# Patient Record
Sex: Male | Born: 1975
Health system: Southern US, Community
[De-identification: ages and names within clinical notes are randomized; demographics above are authoritative.]

## PROBLEM LIST (undated history)

## (undated) DIAGNOSIS — G4730 Sleep apnea, unspecified: Secondary | ICD-10-CM

## (undated) DIAGNOSIS — F419 Anxiety disorder, unspecified: Secondary | ICD-10-CM

## (undated) DIAGNOSIS — L409 Psoriasis, unspecified: Secondary | ICD-10-CM

## (undated) DIAGNOSIS — M549 Dorsalgia, unspecified: Secondary | ICD-10-CM

## (undated) DIAGNOSIS — G4733 Obstructive sleep apnea (adult) (pediatric): Secondary | ICD-10-CM

## (undated) DIAGNOSIS — I10 Essential (primary) hypertension: Secondary | ICD-10-CM

## (undated) DIAGNOSIS — E785 Hyperlipidemia, unspecified: Secondary | ICD-10-CM

## (undated) HISTORY — DX: Obstructive sleep apnea (adult) (pediatric): G47.33

## (undated) HISTORY — DX: Essential (primary) hypertension: I10

## (undated) HISTORY — DX: Hyperlipidemia, unspecified: E78.5

## (undated) HISTORY — DX: Dorsalgia, unspecified: M54.9

## (undated) HISTORY — DX: Morbid (severe) obesity due to excess calories: E66.01

## (undated) HISTORY — PX: KNEE ARTHROSCOPY: SUR90

## (undated) HISTORY — DX: Sleep apnea, unspecified: G47.30

## (undated) HISTORY — DX: Psoriasis, unspecified: L40.9

## (undated) HISTORY — DX: Anxiety disorder, unspecified: F41.9

---

## 2011-08-21 ENCOUNTER — Encounter: Payer: Self-pay | Admitting: Internal Medicine

## 2011-08-21 DIAGNOSIS — Z0000 Encounter for general adult medical examination without abnormal findings: Secondary | ICD-10-CM | POA: Insufficient documentation

## 2011-08-27 ENCOUNTER — Other Ambulatory Visit (INDEPENDENT_AMBULATORY_CARE_PROVIDER_SITE_OTHER): Payer: BC Managed Care – PPO

## 2011-08-27 ENCOUNTER — Other Ambulatory Visit: Payer: Self-pay | Admitting: Internal Medicine

## 2011-08-27 ENCOUNTER — Ambulatory Visit (INDEPENDENT_AMBULATORY_CARE_PROVIDER_SITE_OTHER): Payer: BC Managed Care – PPO | Admitting: Internal Medicine

## 2011-08-27 ENCOUNTER — Encounter: Payer: Self-pay | Admitting: Internal Medicine

## 2011-08-27 VITALS — BP 142/88 | HR 71 | Temp 98.6°F | Ht 71.0 in | Wt 332.0 lb

## 2011-08-27 DIAGNOSIS — Z0000 Encounter for general adult medical examination without abnormal findings: Secondary | ICD-10-CM

## 2011-08-27 DIAGNOSIS — E782 Mixed hyperlipidemia: Secondary | ICD-10-CM | POA: Insufficient documentation

## 2011-08-27 DIAGNOSIS — F419 Anxiety disorder, unspecified: Secondary | ICD-10-CM

## 2011-08-27 DIAGNOSIS — E785 Hyperlipidemia, unspecified: Secondary | ICD-10-CM

## 2011-08-27 DIAGNOSIS — F411 Generalized anxiety disorder: Secondary | ICD-10-CM

## 2011-08-27 DIAGNOSIS — I10 Essential (primary) hypertension: Secondary | ICD-10-CM

## 2011-08-27 HISTORY — DX: Hyperlipidemia, unspecified: E78.5

## 2011-08-27 HISTORY — DX: Essential (primary) hypertension: I10

## 2011-08-27 HISTORY — DX: Anxiety disorder, unspecified: F41.9

## 2011-08-27 LAB — CBC WITH DIFFERENTIAL/PLATELET
Basophils Relative: 0.2 % (ref 0.0–3.0)
Eosinophils Absolute: 0 10*3/uL (ref 0.0–0.7)
Eosinophils Relative: 0.5 % (ref 0.0–5.0)
Hemoglobin: 16 g/dL (ref 13.0–17.0)
MCHC: 34.8 g/dL (ref 30.0–36.0)
MCV: 85.7 fl (ref 78.0–100.0)
Monocytes Absolute: 0.4 10*3/uL (ref 0.1–1.0)
Neutro Abs: 6.5 10*3/uL (ref 1.4–7.7)
Neutrophils Relative %: 74.4 % (ref 43.0–77.0)
RBC: 5.37 Mil/uL (ref 4.22–5.81)
WBC: 8.7 10*3/uL (ref 4.5–10.5)

## 2011-08-27 LAB — LIPID PANEL
Cholesterol: 223 mg/dL — ABNORMAL HIGH (ref 0–200)
HDL: 31.5 mg/dL — ABNORMAL LOW (ref >39.00)
Total CHOL/HDL Ratio: 7
Triglycerides: 325 mg/dL — ABNORMAL HIGH (ref 0.0–149.0)
VLDL: 65 mg/dL — ABNORMAL HIGH (ref 0.0–40.0)

## 2011-08-27 LAB — URINALYSIS, ROUTINE W REFLEX MICROSCOPIC
Bilirubin Urine: NEGATIVE
Nitrite: NEGATIVE
Total Protein, Urine: NEGATIVE
Urine Glucose: NEGATIVE
WBC, UA: NONE SEEN (ref 0–?)
pH: 6 (ref 5.0–8.0)

## 2011-08-27 LAB — HEPATIC FUNCTION PANEL
ALT: 29 U/L (ref 0–53)
Bilirubin, Direct: 0.1 mg/dL (ref 0.0–0.3)
Total Bilirubin: 1 mg/dL (ref 0.3–1.2)

## 2011-08-27 LAB — BASIC METABOLIC PANEL
BUN: 15 mg/dL (ref 6–23)
Calcium: 9.7 mg/dL (ref 8.4–10.5)
Creatinine, Ser: 1 mg/dL (ref 0.4–1.5)
GFR: 86.78 mL/min (ref >60.00)

## 2011-08-27 MED ORDER — ESCITALOPRAM OXALATE 10 MG PO TABS: 10.0000 mg | ORAL_TABLET | Freq: Every day | ORAL | Status: DC

## 2011-08-27 MED ORDER — LOSARTAN POTASSIUM 50 MG PO TABS: 50.0000 mg | ORAL_TABLET | Freq: Every day | ORAL | Status: DC

## 2011-08-27 NOTE — Patient Instructions (Addendum)
Take all new medications as prescribed - the blood pressure medication, and lexapro Please check your blood pressure on a regular basis;  Your goal is to be less than 140/90 Continue all other medications as before, such as tylenol or other OTC medication if needed Please go to LAB in the Basement for the blood and/or urine tests to be done today Please call the phone number [phone] (the [employer] System) for results of testing in 2-3 days;  When calling, simply dial the number, and when prompted enter the MRN number above (the Medical Record Number) and the # key, then the message should start. Please return in 6 months, or sooner if needed

## 2011-08-27 NOTE — Assessment & Plan Note (Signed)

## 2011-08-27 NOTE — Assessment & Plan Note (Addendum)
Mild to mod, for lexapro asd,  to f/u any worsening symptoms or concerns, declines counseling at this time

## 2011-08-27 NOTE — Progress Notes (Signed)
Subjective:    Patient ID: Kyle Levy, male    DOB: 1975/11/12, 35 y.o.   MRN: [id number]  HPI  Here for wellness and f/u;  Overall doing ok;  Pt denies CP, worsening SOB, DOE, wheezing, orthopnea, PND, worsening LE edema, palpitations, dizziness or syncope.  Pt denies neurological change such as new Headache, facial or extremity weakness.  Pt denies polydipsia, polyuria, or low sugar symptoms. Pt states overall good compliance with treatment and medications, good tolerability, and trying to follow lower cholesterol diet.  Pt denies worsening depressive symptoms, suicidal ideation or panic. No fever, wt loss, night sweats, loss of appetite, or other constitutional symptoms.  Pt states good ability with ADL's, low fall risk, home safety reviewed and adequate, no significant changes in hearing or vision, and occasionally active with exercise.  Has significant situational anxiety with getting through airports, and other situations with frustration, irritability.  Denies worsening depressive symptoms, suicidal ideation, or panic, though has ongoing anxiety, not increased except for the above.  Has ongoing financial stressors, married, one child.  No signficant OSA symptoms Past Medical History  Diagnosis Date  . Hypertension   . HTN (hypertension) 08/27/2011   Past Surgical History  Procedure Date  . Knee arthroscopy     bilateral knee    reports that he has never smoked. He does not have any smokeless tobacco history on file. He reports that he drinks alcohol. He reports that he does not use illicit drugs. family history includes Heart disease in his father; Hypertension in his father; and Stroke in his father. No Known Allergies No current outpatient prescriptions on file prior to visit.    Review of Systems Review of Systems  Constitutional: Negative for diaphoresis, activity change, appetite change and unexpected weight change.  HENT: Negative for hearing loss, ear pain, facial swelling,  mouth sores and neck stiffness.   Eyes: Negative for pain, redness and visual disturbance.  Respiratory: Negative for shortness of breath and wheezing.   Cardiovascular: Negative for chest pain and palpitations.  Gastrointestinal: Negative for diarrhea, blood in stool, abdominal distention and rectal pain.  Genitourinary: Negative for hematuria, flank pain and decreased urine volume.  Musculoskeletal: Negative for myalgias and joint swelling.  Skin: Negative for color change and wound.  Neurological: Negative for syncope and numbness.  Hematological: Negative for adenopathy.  Psychiatric/Behavioral: Negative for hallucinations, self-injury, decreased concentration and agitation.      Objective:   Physical Exam BP 142/88  Pulse 71  Temp(Src) 98.6 F (37 C) (Oral)  Ht 5\' 11"  (1.803 m)  Wt 332 lb (150.594 kg)  BMI 46.30 kg/m2  SpO2 97% Physical Exam  VS noted Constitutional: Pt is oriented to person, place, and time. Appears well-developed and well-nourished.  HENT:  Head: Normocephalic and atraumatic.  Right Ear: External ear normal.  Left Ear: External ear normal.  Nose: Nose normal.  Mouth/Throat: Oropharynx is clear and moist.  Eyes: Conjunctivae and EOM are normal. Pupils are equal, round, and reactive to light.  Neck: Normal range of motion. Neck supple. No JVD present. No tracheal deviation present.  Cardiovascular: Normal rate, regular rhythm, normal heart sounds and intact distal pulses.   Pulmonary/Chest: Effort normal and breath sounds normal.  Abdominal: Soft. Bowel sounds are normal. There is no tenderness.  Musculoskeletal: Normal range of motion. Exhibits no edema.  Lymphadenopathy:  Has no cervical adenopathy.  Neurological: Pt is alert and oriented to person, place, and time. Pt has normal reflexes. No cranial nerve deficit.  Skin:  Skin is warm and dry. No rash noted.  Psychiatric:  Behavior is normal. Except for 1+ irritable, not depressed affect   Assessment  & Plan:

## 2011-08-27 NOTE — Assessment & Plan Note (Signed)
Mild, to start losartan 50 qd, f/u next visit BP Readings from Last 3 Encounters:  08/27/11 142/88

## 2011-08-27 NOTE — Assessment & Plan Note (Signed)
Has been on prior statin but did not want to take long term, for lower chol diet, check lipids

## 2011-10-19 ENCOUNTER — Telehealth: Payer: Self-pay | Admitting: Internal Medicine

## 2011-10-19 NOTE — Telephone Encounter (Signed)
Requested Lexapro samples, if available.   [phone]  Thanks!

## 2011-10-19 NOTE — Telephone Encounter (Signed)
Pt's spouse advised 

## 2011-11-08 ENCOUNTER — Other Ambulatory Visit: Payer: Self-pay

## 2011-11-08 DIAGNOSIS — F419 Anxiety disorder, unspecified: Secondary | ICD-10-CM

## 2011-11-08 MED ORDER — LOSARTAN POTASSIUM 50 MG PO TABS: 50.0000 mg | ORAL_TABLET | Freq: Every day | ORAL | Status: DC

## 2011-11-16 ENCOUNTER — Other Ambulatory Visit: Payer: Self-pay

## 2011-11-16 MED ORDER — ESCITALOPRAM OXALATE 10 MG PO TABS: 10.0000 mg | ORAL_TABLET | Freq: Every day | ORAL | Status: DC

## 2012-02-29 ENCOUNTER — Ambulatory Visit (INDEPENDENT_AMBULATORY_CARE_PROVIDER_SITE_OTHER): Payer: BC Managed Care – PPO | Admitting: Internal Medicine

## 2012-02-29 ENCOUNTER — Encounter: Payer: Self-pay | Admitting: Internal Medicine

## 2012-02-29 VITALS — BP 122/84 | HR 69 | Temp 97.5°F | Ht 71.0 in | Wt 345.2 lb

## 2012-02-29 DIAGNOSIS — F419 Anxiety disorder, unspecified: Secondary | ICD-10-CM

## 2012-02-29 DIAGNOSIS — E785 Hyperlipidemia, unspecified: Secondary | ICD-10-CM

## 2012-02-29 DIAGNOSIS — I10 Essential (primary) hypertension: Secondary | ICD-10-CM

## 2012-02-29 DIAGNOSIS — Z0000 Encounter for general adult medical examination without abnormal findings: Secondary | ICD-10-CM

## 2012-02-29 DIAGNOSIS — F411 Generalized anxiety disorder: Secondary | ICD-10-CM

## 2012-02-29 NOTE — Patient Instructions (Addendum)
Continue all other medications as before Please continue your efforts at being more active, low cholesterol diet, and weight control. Please return in 6 months for your yearly visit, or sooner if needed, with Lab testing done 3-5 days before

## 2012-03-01 ENCOUNTER — Encounter: Payer: Self-pay | Admitting: Internal Medicine

## 2012-03-01 NOTE — Assessment & Plan Note (Signed)
stable overall by hx and exam, most recent data reviewed with pt, and pt to continue medical treatment as before Lab Results  Component Value Date   WBC 8.7 08/27/2011   HGB 16.0 08/27/2011   HCT 46.0 08/27/2011   PLT 293.0 08/27/2011   GLUCOSE 93 08/27/2011   CHOL 223* 08/27/2011   TRIG 325.0* 08/27/2011   HDL 31.50* 08/27/2011   LDLDIRECT 141.0 08/27/2011   ALT 29 08/27/2011   AST 31 08/27/2011   NA 137 08/27/2011   K 4.4 08/27/2011   CL 101 08/27/2011   CREATININE 1.0 08/27/2011   BUN 15 08/27/2011   CO2 27 08/27/2011   TSH 1.28 08/27/2011    

## 2012-03-01 NOTE — Assessment & Plan Note (Signed)
stable overall by hx and exam, most recent data reviewed with pt, and pt to continue medical treatment as before BP Readings from Last 3 Encounters:  02/29/12 122/84  08/27/11 142/88

## 2012-03-01 NOTE — Assessment & Plan Note (Signed)
stable overall by hx and exam, most recent data reviewed with pt - DLDL 141 last visit, and pt to continue medical treatment as before but to follow lower chol diet

## 2012-03-01 NOTE — Progress Notes (Signed)
  Subjective:    Patient ID: Kyle Levy, male    DOB: 1976/05/14, 36 y.o.   MRN: [id number]  HPI  Here to f/u; overall doing ok,  Pt denies chest pain, increased sob or doe, wheezing, orthopnea, PND, increased LE swelling, palpitations, dizziness or syncope.  Pt denies new neurological symptoms such as new headache, or facial or extremity weakness or numbness   Pt denies polydipsia, polyuria.  Pt states overall good compliance with meds, trying to follow lower cholesterol diet, wt overall stable but little exercise however.  Wt increased from 332 to 345, despite going to gym to do 30 min on treadmill 3 times per wk.  Denies worsening depressive symptoms, suicidal ideation, or panic, though has ongoing anxiety, not increased recently.  Pt denies fever, wt loss, night sweats, loss of appetite, or other constitutional symptoms  Past Medical History  Diagnosis Date  . Hypertension   . HTN (hypertension) 08/27/2011  . Hyperlipidemia 08/27/2011  . Anxiety 08/27/2011   Past Surgical History  Procedure Date  . Knee arthroscopy     bilateral knee    reports that he has never smoked. He does not have any smokeless tobacco history on file. He reports that he drinks alcohol. He reports that he does not use illicit drugs. family history includes Heart disease in his father; Hypertension in his father; and Stroke in his father. No Known Allergies Current Outpatient Prescriptions on File Prior to Visit  Medication Sig Dispense Refill  . Multiple Vitamin (MULTIVITAMIN) tablet Take 1 tablet by mouth daily.        . B Complex-C (SUPER B COMPLEX PO) Take by mouth daily.        Marland Kitchen escitalopram (LEXAPRO) 10 MG tablet Take 1 tablet (10 mg total) by mouth daily.  90 tablet  3  . losartan (COZAAR) 50 MG tablet Take 1 tablet (50 mg total) by mouth daily.  90 tablet  3   Review of Systems Review of Systems  Constitutional: Negative for diaphoresis and unexpected weight change.  Eyes: Negative for photophobia and  visual disturbance.  Respiratory: Negative for choking and stridor.   Gastrointestinal: Negative for vomiting and blood in stool.  Genitourinary: Negative for hematuria and decreased urine volume.  Musculoskeletal: Negative for gait problem.  Skin: Negative for color change and wound.  Neurological: Negative for tremors and numbness.  Psychiatric/Behavioral: Negative for decreased concentration. The patient is not hyperactive.      Objective:   Physical Exam BP 122/84  Pulse 69  Temp(Src) 97.5 F (36.4 C) (Oral)  Ht 5\' 11"  (1.803 m)  Wt 345 lb 4 oz (156.604 kg)  BMI 48.15 kg/m2  SpO2 95% Physical Exam  VS noted Constitutional: Pt appears well-developed and well-nourished.  HENT: Head: Normocephalic.  Right Ear: External ear normal.  Left Ear: External ear normal.  Eyes: Conjunctivae and EOM are normal. Pupils are equal, round, and reactive to light.  Neck: Normal range of motion. Neck supple.  Cardiovascular: Normal rate and regular rhythm.   Pulmonary/Chest: Effort normal and breath sounds normal.  Abd:  Soft, NT, non-distended, + BS Neurological: Pt is alert. No cranial nerve deficit.  Skin: Skin is warm. No erythema.  Psychiatric: Pt behavior is normal. Thought content normal.     Assessment & Plan:

## 2012-04-29 ENCOUNTER — Telehealth: Payer: Self-pay | Admitting: Internal Medicine

## 2012-04-29 NOTE — Telephone Encounter (Signed)
Caller: Rick/Patient; PCP: Oliver Barre; CB#: [phone];  Call regarding Blood On Toilet Tissue After Stooling; Reports one episode approx July 4 and another on 04/29/12. Emergent symptoms ruled out.  See within 2 weeks per GI Bleeding protocol.  Appointment with Dr. Jonny Ruiz on 8/8/ @ 11:00.  Home care for the interim and parameters for callback given.

## 2012-05-01 ENCOUNTER — Encounter: Payer: Self-pay | Admitting: Internal Medicine

## 2012-05-01 ENCOUNTER — Ambulatory Visit (INDEPENDENT_AMBULATORY_CARE_PROVIDER_SITE_OTHER): Payer: BC Managed Care – PPO | Admitting: Internal Medicine

## 2012-05-01 VITALS — BP 140/90 | HR 70 | Temp 97.3°F | Ht 71.0 in | Wt 344.2 lb

## 2012-05-01 DIAGNOSIS — F411 Generalized anxiety disorder: Secondary | ICD-10-CM

## 2012-05-01 DIAGNOSIS — I10 Essential (primary) hypertension: Secondary | ICD-10-CM

## 2012-05-01 DIAGNOSIS — K921 Melena: Secondary | ICD-10-CM | POA: Insufficient documentation

## 2012-05-01 DIAGNOSIS — F419 Anxiety disorder, unspecified: Secondary | ICD-10-CM

## 2012-05-01 NOTE — Assessment & Plan Note (Signed)
stable overall by hx and exam, most recent data reviewed with pt, and pt to continue medical treatment as before Lab Results  Component Value Date   WBC 8.7 08/27/2011   HGB 16.0 08/27/2011   HCT 46.0 08/27/2011   PLT 293.0 08/27/2011   GLUCOSE 93 08/27/2011   CHOL 223* 08/27/2011   TRIG 325.0* 08/27/2011   HDL 31.50* 08/27/2011   LDLDIRECT 141.0 08/27/2011   ALT 29 08/27/2011   AST 31 08/27/2011   NA 137 08/27/2011   K 4.4 08/27/2011   CL 101 08/27/2011   CREATININE 1.0 08/27/2011   BUN 15 08/27/2011   CO2 27 08/27/2011   TSH 1.28 08/27/2011

## 2012-05-01 NOTE — Assessment & Plan Note (Signed)
Small volume persistently recurrent x 1 mo, exam benign, labs felt not needed at this time, cant r/o malignacy though more likely hemorrhoidal/fissure or even diverticular, for colonoscopy

## 2012-05-01 NOTE — Progress Notes (Signed)
  Subjective:    Patient ID: Kyle Levy, male    DOB: 1976/05/25, 36 y.o.   MRN: [id number]  HPI  Here with 1 mo intermittent BRBPR, small volume, not mixed with stool, only on tissue paper, no abd or rectal pain, no fever.  No other overt bleeding or brusing, no black stools.  No n/v.  No hx of hemorrhoids or fissure.  No prior colonscopy.  Pt denies chest pain, increased sob or doe, wheezing, orthopnea, PND, increased LE swelling, palpitations, dizziness or syncope.  Pt denies new neurological symptoms such as new headache, or facial or extremity weakness or numbness . No straining or constipation, but did try metamucil to see if might help.  Pt denies polydipsia, polyuria.  Denies worsening depressive symptoms, suicidal ideation, or panic, though has ongoing anxiety, not increased recently.  Past Medical History  Diagnosis Date  . Hypertension   . HTN (hypertension) 08/27/2011  . Hyperlipidemia 08/27/2011  . Anxiety 08/27/2011   Past Surgical History  Procedure Date  . Knee arthroscopy     bilateral knee    reports that he has never smoked. He does not have any smokeless tobacco history on file. He reports that he drinks alcohol. He reports that he does not use illicit drugs. family history includes Heart disease in his father; Hypertension in his father; and Stroke in his father. No Known Allergies  Review of Systems Review of Systems  Constitutional: Negative for diaphoresis and unexpected weight change.  HENT: Negative for tinnitus.   Eyes: Negative for photophobia and visual disturbance.  Respiratory: Negative for choking and stridor.   Gastrointestinal: Negative for vomiting and blood in stool.  Genitourinary: Negative for hematuria and decreased urine volume.  Musculoskeletal: Negative for gait problem.  Skin: Negative for color change and wound.  Neurological: Negative for tremors and numbness.  Psychiatric/Behavioral: Negative for decreased concentration. The patient is not  hyperactive.      Objective:   Physical Exam BP 140/90  Pulse 70  Temp 97.3 F (36.3 C) (Oral)  Ht 5\' 11"  (1.803 m)  Wt 344 lb 4 oz (156.151 kg)  BMI 48.01 kg/m2  SpO2 96% Physical Exam  VS noted Constitutional: Pt appears well-developed and well-nourished.  HENT: Head: Normocephalic.  Right Ear: External ear normal.  Left Ear: External ear normal.  Eyes: Conjunctivae and EOM are normal. Pupils are equal, round, and reactive to light.  Neck: Normal range of motion. Neck supple.  Cardiovascular: Normal rate and regular rhythm.   Pulmonary/Chest: Effort normal and breath sounds normal.  Abd:  Soft, NT, non-distended, + BS Neurological: Pt is alert. DRE:  Normal tone, heme neg, prostate normal size, nontender , no nodule but difficult exam due to size, no overt hemorrhoid or fissure noted  Psychiatric: Pt behavior is normal.1+ nervous    Assessment & Plan:

## 2012-05-01 NOTE — Assessment & Plan Note (Signed)
stable overall by hx and exam, most recent data reviewed with pt, and pt to continue medical treatment as before BP Readings from Last 3 Encounters:  05/01/12 140/90  02/29/12 122/84  08/27/11 142/88

## 2012-05-01 NOTE — Patient Instructions (Addendum)
Continue all other medications as before You will be contacted regarding the referral for: colonoscopy  

## 2012-05-13 ENCOUNTER — Other Ambulatory Visit: Payer: Self-pay

## 2012-05-13 MED ORDER — ESCITALOPRAM OXALATE 10 MG PO TABS: 10.0000 mg | ORAL_TABLET | Freq: Every day | ORAL | Status: DC

## 2012-05-13 NOTE — Telephone Encounter (Signed)
Patients wife called requesting a 30 day supply of lexapro as refill is with mail order and has not arrived.  The patient is going out of town tomorrow and needs enough for the weekend.  Printed refill for the wife to pickup at the front desk.

## 2012-09-03 ENCOUNTER — Ambulatory Visit: Payer: BC Managed Care – PPO | Admitting: Internal Medicine

## 2012-10-01 ENCOUNTER — Encounter: Payer: Self-pay | Admitting: Internal Medicine

## 2012-10-01 ENCOUNTER — Ambulatory Visit (INDEPENDENT_AMBULATORY_CARE_PROVIDER_SITE_OTHER): Payer: Managed Care, Other (non HMO) | Admitting: Internal Medicine

## 2012-10-01 ENCOUNTER — Other Ambulatory Visit (INDEPENDENT_AMBULATORY_CARE_PROVIDER_SITE_OTHER): Payer: Managed Care, Other (non HMO)

## 2012-10-01 VITALS — BP 112/80 | HR 87 | Temp 98.0°F | Ht 71.0 in | Wt 353.4 lb

## 2012-10-01 DIAGNOSIS — Z0000 Encounter for general adult medical examination without abnormal findings: Secondary | ICD-10-CM

## 2012-10-01 DIAGNOSIS — I10 Essential (primary) hypertension: Secondary | ICD-10-CM

## 2012-10-01 LAB — URINALYSIS, ROUTINE W REFLEX MICROSCOPIC
Ketones, ur: NEGATIVE
Specific Gravity, Urine: 1.015 (ref 1.000–1.030)
Total Protein, Urine: NEGATIVE
Urine Glucose: NEGATIVE

## 2012-10-01 LAB — CBC WITH DIFFERENTIAL/PLATELET
Basophils Relative: 0.3 % (ref 0.0–3.0)
Eosinophils Absolute: 0.1 10*3/uL (ref 0.0–0.7)
HCT: 43.8 % (ref 39.0–52.0)
Hemoglobin: 15.2 g/dL (ref 13.0–17.0)
Lymphocytes Relative: 24.1 % (ref 12.0–46.0)
Lymphs Abs: 2 10*3/uL (ref 0.7–4.0)
MCHC: 34.7 g/dL (ref 30.0–36.0)
Monocytes Relative: 6.1 % (ref 3.0–12.0)
Neutro Abs: 5.7 10*3/uL (ref 1.4–7.7)
RBC: 5.21 Mil/uL (ref 4.22–5.81)

## 2012-10-01 LAB — LIPID PANEL
HDL: 27 mg/dL — ABNORMAL LOW (ref >39.00)
Triglycerides: 291 mg/dL — ABNORMAL HIGH (ref 0.0–149.0)
VLDL: 58.2 mg/dL — ABNORMAL HIGH (ref 0.0–40.0)

## 2012-10-01 LAB — BASIC METABOLIC PANEL
BUN: 14 mg/dL (ref 6–23)
Calcium: 9.3 mg/dL (ref 8.4–10.5)
Creatinine, Ser: 1 mg/dL (ref 0.4–1.5)
GFR: 95.71 mL/min (ref >60.00)
Glucose, Bld: 88 mg/dL (ref 70–99)
Potassium: 4.1 mEq/L (ref 3.5–5.1)

## 2012-10-01 LAB — HEPATIC FUNCTION PANEL
Alkaline Phosphatase: 70 U/L (ref 39–117)
Bilirubin, Direct: 0.1 mg/dL (ref 0.0–0.3)
Total Protein: 8 g/dL (ref 6.0–8.3)

## 2012-10-01 LAB — LDL CHOLESTEROL, DIRECT: Direct LDL: 127.4 mg/dL

## 2012-10-01 NOTE — Assessment & Plan Note (Signed)

## 2012-10-01 NOTE — Progress Notes (Signed)
Subjective:    Patient ID: Kyle Levy, male    DOB: July 13, 1976, 37 y.o.   MRN: [id number]  HPI  Here for wellness and f/u;  Overall doing ok;  Pt denies CP, worsening SOB, DOE, wheezing, orthopnea, PND, worsening LE edema, palpitations, dizziness or syncope.  Pt denies neurological change such as new Headache, facial or extremity weakness.  Pt denies polydipsia, polyuria, or low sugar symptoms. Pt states overall good compliance with treatment and medications, good tolerability, and trying to follow lower cholesterol diet.  Pt denies worsening depressive symptoms, suicidal ideation or panic. No fever, wt loss, night sweats, loss of appetite, or other constitutional symptoms.  Pt states good ability with ADL's, low fall risk, home safety reviewed and adequate, no significant changes in hearing or vision, and occasionally active with exercise.  Goes to the gym since the first of yr 4 times per wk, lost 4 lbs already with better diet as well.  Has had some unusual numbness to left buttock and prox lateral LLE but no pain or weakness. And is intermittent.  Does have sense ooccas fatigue, but denies signficant hypersomnolence.  Past Medical History  Diagnosis Date  . Hypertension   . HTN (hypertension) 08/27/2011  . Hyperlipidemia 08/27/2011  . Anxiety 08/27/2011   Past Surgical History  Procedure Date  . Knee arthroscopy     bilateral knee    reports that he has never smoked. He does not have any smokeless tobacco history on file. He reports that he drinks alcohol. He reports that he does not use illicit drugs. family history includes Heart disease in his father; Hypertension in his father; and Stroke in his father. No Known Allergies Current Outpatient Prescriptions on File Prior to Visit  Medication Sig Dispense Refill  . losartan (COZAAR) 50 MG tablet Take 1 tablet (50 mg total) by mouth daily.  90 tablet  3  . Multiple Vitamin (MULTIVITAMIN) tablet Take 1 tablet by mouth daily.        .  Omega-3 Fatty Acids (FISH OIL PO) Take by mouth daily.      . Red Yeast Rice Extract (RED YEAST RICE PO) Take by mouth 2 (two) times daily.      Marland Kitchen escitalopram (LEXAPRO) 10 MG tablet Take 1 tablet (10 mg total) by mouth daily.  30 tablet  0   Review of Systems Review of Systems  Constitutional: Negative for diaphoresis, activity change, appetite change and unexpected weight change.  HENT: Negative for hearing loss, ear pain, facial swelling, mouth sores and neck stiffness.   Eyes: Negative for pain, redness and visual disturbance.  Respiratory: Negative for shortness of breath and wheezing.   Cardiovascular: Negative for chest pain and palpitations.  Gastrointestinal: Negative for diarrhea, blood in stool, abdominal distention and rectal pain.  Genitourinary: Negative for hematuria, flank pain and decreased urine volume.  Musculoskeletal: Negative for myalgias and joint swelling.  Skin: Negative for color change and wound.  Neurological: Negative for syncope and numbness.  Hematological: Negative for adenopathy.  Psychiatric/Behavioral: Negative for hallucinations, self-injury, decreased concentration and agitation.      Objective:   Physical Exam BP 112/80  Pulse 87  Temp 98 F (36.7 C) (Oral)  Ht 5\' 11"  (1.803 m)  Wt 353 lb 6 oz (160.29 kg)  BMI 49.29 kg/m2  SpO2 93% Physical Exam  VS noted Constitutional: Pt is oriented to person, place, and time. Appears well-developed and well-nourished. /morbid obese HENT:  Head: Normocephalic and atraumatic.  Right Ear: External ear  normal.  Left Ear: External ear normal.  Nose: Nose normal.  Mouth/Throat: Oropharynx is clear and moist.  Eyes: Conjunctivae and EOM are normal. Pupils are equal, round, and reactive to light.  Neck: Normal range of motion. Neck supple. No JVD present. No tracheal deviation present.  Cardiovascular: Normal rate, regular rhythm, normal heart sounds and intact distal pulses.   Pulmonary/Chest: Effort normal  and breath sounds normal.  Abdominal: Soft. Bowel sounds are normal. There is no tenderness.  Musculoskeletal: Normal range of motion. Exhibits no edema.  Lymphadenopathy:  Has no cervical adenopathy.  Neurological: Pt is alert and oriented to person, place, and time. Pt has normal reflexes. No cranial nerve deficit. Motor/sens/dtr intact  Skin: Skin is warm and dry. No rash noted.  Psychiatric:  Has  normal mood and affect. Behavior is normal. except mild nervous    Assessment & Plan:

## 2012-10-01 NOTE — Patient Instructions (Addendum)
Continue all other medications as before Please have the pharmacy call with any other refills you may need. Please continue your efforts at being more active, low cholesterol diet, and weight control, as you are Please go to LAB in the Basement for the blood and/or urine tests to be done today You will be contacted by phone if any changes need to be made immediately.  Otherwise, you will receive a letter about your results with an explanation, but please check with MyChart first. Please return if you change your mind about the tetanus and flu shots. Thank you for enrolling in MyChart. Please follow the instructions below to securely access your online medical record. MyChart allows you to send messages to your doctor, view your test results, renew your prescriptions, schedule appointments, and more To Log into MyChart, please go to [email or web address], and your Username is: [phone] Please return in 1 year for your yearly visit, or sooner if needed, with Lab testing done 3-5 days before

## 2012-10-04 ENCOUNTER — Encounter: Payer: Self-pay | Admitting: Internal Medicine

## 2012-10-04 NOTE — Assessment & Plan Note (Signed)
stable overall by history and exam, recent data reviewed with pt, and pt to continue medical treatment as before,  to f/u any worsening symptoms or concerns BP Readings from Last 3 Encounters:  10/01/12 112/80  05/01/12 140/90  02/29/12 122/84

## 2012-11-08 ENCOUNTER — Other Ambulatory Visit: Payer: Self-pay

## 2012-11-11 ENCOUNTER — Telehealth: Payer: Self-pay

## 2012-11-11 MED ORDER — ESCITALOPRAM OXALATE 10 MG PO TABS: 10.0000 mg | ORAL_TABLET | Freq: Every day | ORAL | Status: DC

## 2012-11-11 NOTE — Telephone Encounter (Signed)
rx refill

## 2012-11-13 ENCOUNTER — Other Ambulatory Visit: Payer: Self-pay | Admitting: Internal Medicine

## 2013-07-30 ENCOUNTER — Other Ambulatory Visit: Payer: Self-pay

## 2013-10-05 ENCOUNTER — Encounter: Payer: Managed Care, Other (non HMO) | Admitting: Internal Medicine

## 2013-10-06 ENCOUNTER — Encounter: Payer: Self-pay | Admitting: Internal Medicine

## 2013-10-06 ENCOUNTER — Ambulatory Visit (INDEPENDENT_AMBULATORY_CARE_PROVIDER_SITE_OTHER): Payer: Managed Care, Other (non HMO) | Admitting: Internal Medicine

## 2013-10-06 ENCOUNTER — Other Ambulatory Visit (INDEPENDENT_AMBULATORY_CARE_PROVIDER_SITE_OTHER): Payer: Managed Care, Other (non HMO)

## 2013-10-06 VITALS — BP 140/90 | HR 86 | Temp 98.1°F | Ht 71.0 in | Wt 371.0 lb

## 2013-10-06 DIAGNOSIS — Z23 Encounter for immunization: Secondary | ICD-10-CM

## 2013-10-06 DIAGNOSIS — F419 Anxiety disorder, unspecified: Secondary | ICD-10-CM

## 2013-10-06 DIAGNOSIS — Z0000 Encounter for general adult medical examination without abnormal findings: Secondary | ICD-10-CM

## 2013-10-06 DIAGNOSIS — R21 Rash and other nonspecific skin eruption: Secondary | ICD-10-CM

## 2013-10-06 DIAGNOSIS — I10 Essential (primary) hypertension: Secondary | ICD-10-CM

## 2013-10-06 DIAGNOSIS — F411 Generalized anxiety disorder: Secondary | ICD-10-CM

## 2013-10-06 LAB — HEPATIC FUNCTION PANEL
ALBUMIN: 4.5 g/dL (ref 3.5–5.2)
ALT: 34 U/L (ref 0–53)
AST: 28 U/L (ref 0–37)
Alkaline Phosphatase: 71 U/L (ref 39–117)
BILIRUBIN TOTAL: 0.6 mg/dL (ref 0.3–1.2)
Bilirubin, Direct: 0.1 mg/dL (ref 0.0–0.3)
Total Protein: 8.4 g/dL — ABNORMAL HIGH (ref 6.0–8.3)

## 2013-10-06 LAB — URINALYSIS, ROUTINE W REFLEX MICROSCOPIC
Bilirubin Urine: NEGATIVE
Hgb urine dipstick: NEGATIVE
Ketones, ur: NEGATIVE
LEUKOCYTES UA: NEGATIVE
Nitrite: NEGATIVE
Specific Gravity, Urine: 1.015 (ref 1.000–1.030)
Total Protein, Urine: NEGATIVE
Urine Glucose: NEGATIVE
Urobilinogen, UA: 0.2 (ref 0.0–1.0)
pH: 6.5 (ref 5.0–8.0)

## 2013-10-06 LAB — CBC WITH DIFFERENTIAL/PLATELET
BASOS ABS: 0 10*3/uL (ref 0.0–0.1)
Basophils Relative: 0.4 % (ref 0.0–3.0)
Eosinophils Absolute: 0.1 10*3/uL (ref 0.0–0.7)
Eosinophils Relative: 0.9 % (ref 0.0–5.0)
HEMATOCRIT: 44 % (ref 39.0–52.0)
Hemoglobin: 15 g/dL (ref 13.0–17.0)
LYMPHS ABS: 2.2 10*3/uL (ref 0.7–4.0)
Lymphocytes Relative: 26.3 % (ref 12.0–46.0)
MCHC: 34.2 g/dL (ref 30.0–36.0)
MCV: 84.4 fl (ref 78.0–100.0)
MONO ABS: 0.6 10*3/uL (ref 0.1–1.0)
Monocytes Relative: 6.9 % (ref 3.0–12.0)
Neutro Abs: 5.5 10*3/uL (ref 1.4–7.7)
Neutrophils Relative %: 65.5 % (ref 43.0–77.0)
PLATELETS: 312 10*3/uL (ref 150.0–400.0)
RBC: 5.21 Mil/uL (ref 4.22–5.81)
RDW: 13.2 % (ref 11.5–14.6)
WBC: 8.4 10*3/uL (ref 4.5–10.5)

## 2013-10-06 LAB — BASIC METABOLIC PANEL
BUN: 18 mg/dL (ref 6–23)
CALCIUM: 9.3 mg/dL (ref 8.4–10.5)
CO2: 29 mEq/L (ref 19–32)
CREATININE: 1.1 mg/dL (ref 0.4–1.5)
Chloride: 101 mEq/L (ref 96–112)
GFR: 77.9 mL/min (ref >60.00)
GLUCOSE: 86 mg/dL (ref 70–99)
Potassium: 4 mEq/L (ref 3.5–5.1)
SODIUM: 136 meq/L (ref 135–145)

## 2013-10-06 LAB — LIPID PANEL
CHOLESTEROL: 232 mg/dL — AB (ref 0–200)
HDL: 27.8 mg/dL — AB (ref >39.00)
Total CHOL/HDL Ratio: 8
Triglycerides: 385 mg/dL — ABNORMAL HIGH (ref 0.0–149.0)
VLDL: 77 mg/dL — ABNORMAL HIGH (ref 0.0–40.0)

## 2013-10-06 LAB — TSH: TSH: 2.09 u[IU]/mL (ref 0.35–5.50)

## 2013-10-06 LAB — LDL CHOLESTEROL, DIRECT: Direct LDL: 153.6 mg/dL

## 2013-10-06 MED ORDER — LOSARTAN POTASSIUM 50 MG PO TABS: ORAL_TABLET | ORAL | Status: DC

## 2013-10-06 MED ORDER — TRIAMCINOLONE ACETONIDE 0.1 % EX CREA: 1.0000 "application " | TOPICAL_CREAM | Freq: Two times a day (BID) | CUTANEOUS | Status: DC

## 2013-10-06 MED ORDER — ESCITALOPRAM OXALATE 20 MG PO TABS: 20.0000 mg | ORAL_TABLET | Freq: Every day | ORAL | Status: DC

## 2013-10-06 NOTE — Assessment & Plan Note (Signed)
Ok for incr lexapro to 20 mg

## 2013-10-06 NOTE — Assessment & Plan Note (Signed)
prob eczema right hand -for triam cr prn

## 2013-10-06 NOTE — Progress Notes (Signed)
Pre-visit discussion using our clinic review tool. No additional management support is needed unless otherwise documented below in the visit note.  

## 2013-10-06 NOTE — Patient Instructions (Addendum)
You had the Tdap tetanus shot today Please take all new medication as prescribed  The steroid cream for the rash OK to increase the lexapro to 20 mg per day Please continue all other medications as before, and refills have been done if requested. Please have the pharmacy call with any other refills you may need. Please continue your efforts at being more active, low cholesterol diet, and weight control. You are otherwise up to date with prevention measures today.  Please keep your appointments with your specialists as you may have planned  Please go to the LAB in the Basement (turn left off the elevator) for the tests to be done today You will be contacted by phone if any changes need to be made immediately.  Otherwise, you will receive a letter about your results with an explanation, but please check with MyChart first.  Please remember to sign up for My Chart if you have not done so, as this will be important to you in the future with finding out test results, communicating by private email, and scheduling acute appointments online when needed.  Please return in 1 year for your yearly visit, or sooner if needed, with Lab testing done 3-5 days before

## 2013-10-06 NOTE — Progress Notes (Signed)
Subjective:    Patient ID: Kyle Levy, male    DOB: 1975/12/18, 38 y.o.   MRN: [id number]  HPI  Here for wellness and f/u;  Overall doing ok;  Pt denies CP, worsening SOB, DOE, wheezing, orthopnea, PND, worsening LE edema, palpitations, dizziness or syncope.  Pt denies neurological change such as new headache, facial or extremity weakness.  Pt denies polydipsia, polyuria, or low sugar symptoms. Pt states overall good compliance with treatment and medications, good tolerability, and has been trying to follow lower cholesterol diet.  Pt denies worsening depressive symptoms, suicidal ideation or panic. No fever, night sweats, wt loss, loss of appetite, or other constitutional symptoms.  Pt states good ability with ADL's, has low fall risk, home safety reviewed and adequate, no other significant changes in hearing or vision, and only occasionally active with exercise.  Lexapro 10 not working as well, no acute complaints.  No hypersomnolence Past Medical History  Diagnosis Date  . Hypertension   . HTN (hypertension) 08/27/2011  . Hyperlipidemia 08/27/2011  . Anxiety 08/27/2011   Past Surgical History  Procedure Laterality Date  . Knee arthroscopy      bilateral knee    reports that he has never smoked. He does not have any smokeless tobacco history on file. He reports that he drinks alcohol. He reports that he does not use illicit drugs. family history includes Heart disease in his father; Hypertension in his father; Stroke in his father. No Known Allergies Current Outpatient Prescriptions on File Prior to Visit  Medication Sig Dispense Refill  . losartan (COZAAR) 50 MG tablet TAKE 1 TABLET (50 MG TOTAL) BY MOUTH DAILY.  30 tablet  11  . Multiple Vitamin (MULTIVITAMIN) tablet Take 1 tablet by mouth daily.        . Omega-3 Fatty Acids (FISH OIL PO) Take by mouth daily.      . Red Yeast Rice Extract (RED YEAST RICE PO) Take by mouth 2 (two) times daily.      Marland Kitchen escitalopram (LEXAPRO) 10 MG  tablet Take 1 tablet (10 mg total) by mouth daily.  90 tablet  3   No current facility-administered medications on file prior to visit.    Review of Systems Constitutional: Negative for diaphoresis, activity change, appetite change or unexpected weight change.  HENT: Negative for hearing loss, ear pain, facial swelling, mouth sores and neck stiffness.   Eyes: Negative for pain, redness and visual disturbance.  Respiratory: Negative for shortness of breath and wheezing.   Cardiovascular: Negative for chest pain and palpitations.  Gastrointestinal: Negative for diarrhea, blood in stool, abdominal distention or other pain Genitourinary: Negative for hematuria, flank pain or change in urine volume.  Musculoskeletal: Negative for myalgias and joint swelling.  Skin: Negative for color change and wound.  Neurological: Negative for syncope and numbness. other than noted Hematological: Negative for adenopathy.  Psychiatric/Behavioral: Negative for hallucinations, self-injury, decreased concentration and agitation.      Objective:   Physical Exam BP 140/90  Pulse 86  Temp(Src) 98.1 F (36.7 C) (Oral)  Ht 5\' 11"  (1.803 m)  Wt 371 lb (168.284 kg)  BMI 51.77 kg/m2  SpO2 92% VS noted,  Constitutional: Pt is oriented to person, place, and time. Appears well-developed and well-nourished.  Head: Normocephalic and atraumatic.  Right Ear: External ear normal.  Left Ear: External ear normal.  Nose: Nose normal.  Mouth/Throat: Oropharynx is clear and moist.  Eyes: Conjunctivae and EOM are normal. Pupils are equal, round, and reactive to  light.  Neck: Normal range of motion. Neck supple. No JVD present. No tracheal deviation present.  Cardiovascular: Normal rate, regular rhythm, normal heart sounds and intact distal pulses.   Pulmonary/Chest: Effort normal and breath sounds normal.  Abdominal: Soft. Bowel sounds are normal. There is no tenderness. No HSM  Musculoskeletal: Normal range of motion.  Exhibits no edema.  Lymphadenopathy:  Has no cervical adenopathy.  Neurological: Pt is alert and oriented to person, place, and time. Pt has normal reflexes. No cranial nerve deficit.  Skin: Skin is warm and dry. No rash noted. except for exzema type rash to knuckles right hand Psychiatric:  Has  normal mood and affect. Behavior is normal. except 1+ nervous, some chronic irritation    Assessment & Plan:

## 2013-10-06 NOTE — Assessment & Plan Note (Signed)
stable overall by history and exam, recent data reviewed with pt, and pt to continue medical treatment as before,  to f/u any worsening symptoms or concerns BP Readings from Last 3 Encounters:  10/06/13 140/90  10/01/12 112/80  05/01/12 140/90

## 2013-10-06 NOTE — Assessment & Plan Note (Signed)

## 2013-10-06 NOTE — Addendum Note (Signed)
Addended by: Scharlene GlossEWING, Rasheed Welty B on: 10/06/2013 03:38 PM   Modules accepted: Orders

## 2013-11-23 ENCOUNTER — Ambulatory Visit: Payer: Managed Care, Other (non HMO) | Admitting: Sports Medicine

## 2013-11-23 DIAGNOSIS — Z0289 Encounter for other administrative examinations: Secondary | ICD-10-CM

## 2014-03-15 ENCOUNTER — Encounter: Payer: Self-pay | Admitting: Internal Medicine

## 2014-03-30 ENCOUNTER — Encounter: Payer: Self-pay | Admitting: Internal Medicine

## 2014-10-12 ENCOUNTER — Ambulatory Visit (INDEPENDENT_AMBULATORY_CARE_PROVIDER_SITE_OTHER): Payer: 59 | Admitting: Internal Medicine

## 2014-10-12 ENCOUNTER — Telehealth: Payer: Self-pay | Admitting: Internal Medicine

## 2014-10-12 ENCOUNTER — Other Ambulatory Visit: Payer: Self-pay

## 2014-10-12 ENCOUNTER — Other Ambulatory Visit (INDEPENDENT_AMBULATORY_CARE_PROVIDER_SITE_OTHER): Payer: 59

## 2014-10-12 ENCOUNTER — Encounter: Payer: Self-pay | Admitting: Internal Medicine

## 2014-10-12 VITALS — BP 150/100 | HR 79 | Temp 97.6°F | Ht 71.0 in | Wt 376.0 lb

## 2014-10-12 DIAGNOSIS — Z0000 Encounter for general adult medical examination without abnormal findings: Secondary | ICD-10-CM

## 2014-10-12 DIAGNOSIS — I10 Essential (primary) hypertension: Secondary | ICD-10-CM

## 2014-10-12 DIAGNOSIS — E785 Hyperlipidemia, unspecified: Secondary | ICD-10-CM

## 2014-10-12 LAB — CBC WITH DIFFERENTIAL/PLATELET
Basophils Absolute: 0 10*3/uL (ref 0.0–0.1)
Basophils Relative: 0.4 % (ref 0.0–3.0)
Eosinophils Absolute: 0.1 10*3/uL (ref 0.0–0.7)
Eosinophils Relative: 1.5 % (ref 0.0–5.0)
HCT: 44.8 % (ref 39.0–52.0)
Hemoglobin: 15.1 g/dL (ref 13.0–17.0)
LYMPHS ABS: 1.9 10*3/uL (ref 0.7–4.0)
LYMPHS PCT: 21.7 % (ref 12.0–46.0)
MCHC: 33.8 g/dL (ref 30.0–36.0)
MCV: 85.2 fl (ref 78.0–100.0)
Monocytes Absolute: 0.6 10*3/uL (ref 0.1–1.0)
Monocytes Relative: 6.9 % (ref 3.0–12.0)
NEUTROS ABS: 6.2 10*3/uL (ref 1.4–7.7)
NEUTROS PCT: 69.5 % (ref 43.0–77.0)
Platelets: 319 10*3/uL (ref 150.0–400.0)
RBC: 5.26 Mil/uL (ref 4.22–5.81)
RDW: 12.9 % (ref 11.5–15.5)
WBC: 8.9 10*3/uL (ref 4.0–10.5)

## 2014-10-12 LAB — URINALYSIS, ROUTINE W REFLEX MICROSCOPIC
Hgb urine dipstick: NEGATIVE
Ketones, ur: NEGATIVE
LEUKOCYTES UA: NEGATIVE
NITRITE: NEGATIVE
PH: 6 (ref 5.0–8.0)
Specific Gravity, Urine: 1.02 (ref 1.000–1.030)
Total Protein, Urine: NEGATIVE
Urine Glucose: NEGATIVE
Urobilinogen, UA: 0.2 (ref 0.0–1.0)
WBC, UA: NONE SEEN (ref 0–?)

## 2014-10-12 LAB — BASIC METABOLIC PANEL
BUN: 21 mg/dL (ref 6–23)
CHLORIDE: 102 meq/L (ref 96–112)
CO2: 26 mEq/L (ref 19–32)
Calcium: 9.7 mg/dL (ref 8.4–10.5)
Creatinine, Ser: 1.05 mg/dL (ref 0.40–1.50)
GFR: 84.31 mL/min (ref >60.00)
Glucose, Bld: 94 mg/dL (ref 70–99)
Potassium: 4.1 mEq/L (ref 3.5–5.1)
SODIUM: 136 meq/L (ref 135–145)

## 2014-10-12 LAB — HEPATIC FUNCTION PANEL
ALK PHOS: 67 U/L (ref 39–117)
ALT: 42 U/L (ref 0–53)
AST: 37 U/L (ref 0–37)
Albumin: 4.2 g/dL (ref 3.5–5.2)
BILIRUBIN TOTAL: 0.6 mg/dL (ref 0.2–1.2)
Bilirubin, Direct: 0.1 mg/dL (ref 0.0–0.3)
Total Protein: 7.5 g/dL (ref 6.0–8.3)

## 2014-10-12 LAB — LDL CHOLESTEROL, DIRECT: Direct LDL: 131 mg/dL

## 2014-10-12 LAB — LIPID PANEL
CHOLESTEROL: 214 mg/dL — AB (ref 0–200)
HDL: 32.1 mg/dL — AB (ref >39.00)
Total CHOL/HDL Ratio: 7

## 2014-10-12 LAB — TSH: TSH: 1.34 u[IU]/mL (ref 0.35–4.50)

## 2014-10-12 MED ORDER — LOSARTAN POTASSIUM 100 MG PO TABS: 100.0000 mg | ORAL_TABLET | Freq: Every day | ORAL | Status: DC

## 2014-10-12 MED ORDER — ESCITALOPRAM OXALATE 20 MG PO TABS: 20.0000 mg | ORAL_TABLET | Freq: Every day | ORAL | Status: DC

## 2014-10-12 MED ORDER — LOSARTAN POTASSIUM 100 MG PO TABS: ORAL_TABLET | ORAL | Status: DC

## 2014-10-12 MED ORDER — AMLODIPINE BESYLATE 5 MG PO TABS: 5.0000 mg | ORAL_TABLET | Freq: Every day | ORAL | Status: DC

## 2014-10-12 MED ORDER — ATORVASTATIN CALCIUM 10 MG PO TABS: 10.0000 mg | ORAL_TABLET | Freq: Every day | ORAL | Status: DC

## 2014-10-12 NOTE — Telephone Encounter (Signed)
Ok for steph to re-send all meds to [facility] please

## 2014-10-12 NOTE — Telephone Encounter (Signed)
Pt called to state he did not give updated pharmacy info, pls send to [facility], Lipitor and whatever else was prescribed today

## 2014-10-12 NOTE — Assessment & Plan Note (Signed)
Mild uncontrolled, to start amlod 10 qd, cont exercise, diet, wt loss, low salt

## 2014-10-12 NOTE — Progress Notes (Signed)
Pre visit review using our clinic review tool, if applicable. No additional management support is needed unless otherwise documented below in the visit note. 

## 2014-10-12 NOTE — Patient Instructions (Addendum)
Please take all new medication as prescribed - the lipitor at 10 mg per day, and the amlodipine at 5 mg per day  OK to increase the losartan to 100 mg per day  Please continue all other medications as before, and refills have been done if requested.  Please have the pharmacy call with any other refills you may need.  Please continue your efforts at being more active, low cholesterol diet, and weight control.  You are otherwise up to date with prevention measures today.  Please keep your appointments with your specialists as you may have planned  Please go to the LAB in the Basement (turn left off the elevator) for the tests to be done today  You will be contacted by phone if any changes need to be made immediately.  Otherwise, you will receive a letter about your results with an explanation, but please check with MyChart first.  Please remember to sign up for MyChart if you have not done so, as this will be important to you in the future with finding out test results, communicating by private email, and scheduling acute appointments online when needed.  Please return in 6 months, or sooner if needed, with Lab testing done 3-5 days before

## 2014-10-12 NOTE — Progress Notes (Signed)
Subjective:    Patient ID: Kyle Levy, male    DOB: 25-Feb-1976, 39 y.o.   MRN: [id number]  HPI Here for wellness and f/u;  Overall doing ok;  Pt denies CP, worsening SOB, DOE, wheezing, orthopnea, PND, worsening LE edema, palpitations, dizziness or syncope.  Pt denies neurological change such as new headache, facial or extremity weakness.  Pt denies polydipsia, polyuria, or low sugar symptoms. Pt states overall good compliance with treatment and medications, good tolerability, and has been trying to follow lower cholesterol diet.  Pt denies worsening depressive symptoms, suicidal ideation or panic. No fever, night sweats, wt loss, loss of appetite, or other constitutional symptoms.  Pt states good ability with ADL's, has low fall risk, home safety reviewed and adequate, no other significant changes in hearing or vision, and only occasionally active with exercise., though does 30 min cardio 4 days per wk. Father with hx of heart dz  In his 30.s.  Non smoker.   Past Medical History  Diagnosis Date  . Hypertension   . HTN (hypertension) 08/27/2011  . Hyperlipidemia 08/27/2011  . Anxiety 08/27/2011   Past Surgical History  Procedure Laterality Date  . Knee arthroscopy      bilateral knee    reports that he has never smoked. He does not have any smokeless tobacco history on file. He reports that he drinks alcohol. He reports that he does not use illicit drugs. family history includes Heart disease in his father; Hypertension in his father; Stroke in his father. No Known Allergies Current Outpatient Prescriptions on File Prior to Visit  Medication Sig Dispense Refill  . escitalopram (LEXAPRO) 20 MG tablet Take 1 tablet (20 mg total) by mouth daily. 90 tablet 3  . losartan (COZAAR) 50 MG tablet TAKE 1 TABLET (50 MG TOTAL) BY MOUTH DAILY. 90 tablet 3  . Multiple Vitamin (MULTIVITAMIN) tablet Take 1 tablet by mouth daily.      . Omega-3 Fatty Acids (FISH OIL PO) Take by mouth daily.    . Red  Yeast Rice Extract (RED YEAST RICE PO) Take by mouth 2 (two) times daily.    Marland Kitchen triamcinolone cream (KENALOG) 0.1 % Apply 1 application topically 2 (two) times daily. 30 g 0   No current facility-administered medications on file prior to visit.    Review of Systems Constitutional: Negative for increased diaphoresis, other activity, appetite or other siginficant weight change  HENT: Negative for worsening hearing loss, ear pain, facial swelling, mouth sores and neck stiffness.   Eyes: Negative for other worsening pain, redness or visual disturbance.  Respiratory: Negative for shortness of breath and wheezing.   Cardiovascular: Negative for chest pain and palpitations.  Gastrointestinal: Negative for diarrhea, blood in stool, abdominal distention or other pain Genitourinary: Negative for hematuria, flank pain or change in urine volume.  Musculoskeletal: Negative for myalgias or other joint complaints.  Skin: Negative for color change and wound.  Neurological: Negative for syncope and numbness. other than noted Hematological: Negative for adenopathy. or other swelling Psychiatric/Behavioral: Negative for hallucinations, self-injury, decreased concentration or other worsening agitation.      Objective:   Physical Exam BP 150/100 mmHg  Pulse 79  Temp(Src) 97.6 F (36.4 C) (Oral)  Ht  (1.803 m)  Wt 376 lb (170.552 kg)  BMI 52.46 kg/m2  SpO2 94% VS noted,  Constitutional: Pt is oriented to person, place, and time. Appears well-developed and well-nourished.  Head: Normocephalic and atraumatic.  Right Ear: External ear normal.  Left Ear:  External ear normal.  Nose: Nose normal.  Mouth/Throat: Oropharynx is clear and moist.  Eyes: Conjunctivae and EOM are normal. Pupils are equal, round, and reactive to light.  Neck: Normal range of motion. Neck supple. No JVD present. No tracheal deviation present.  Cardiovascular: Normal rate, regular rhythm, normal heart sounds and intact distal  pulses.   Pulmonary/Chest: Effort normal and breath sounds without rales or wheezing  Abdominal: Soft. Bowel sounds are normal. NT. No HSM  Musculoskeletal: Normal range of motion. Exhibits no edema.  Lymphadenopathy:  Has no cervical adenopathy.  Neurological: Pt is alert and oriented to person, place, and time. Pt has normal reflexes. No cranial nerve deficit. Motor grossly intact Skin: Skin is warm and dry. No rash noted.  Psychiatric:  Has somewhat nervous mood and affect. Behavior is normal.   BP Readings from Last 3 Encounters:  10/12/14 150/100  10/06/13 140/90  10/01/12 112/80   Wt Readings from Last 3 Encounters:  10/12/14 376 lb (170.552 kg)  10/06/13 371 lb (168.284 kg)  10/01/12 353 lb 6 oz (160.29 kg)        Assessment & Plan:

## 2014-10-12 NOTE — Telephone Encounter (Signed)
Pharmacy changed, rx resent, pt contacted.

## 2014-10-12 NOTE — Assessment & Plan Note (Signed)
With Father FH- to start lipitor 10 qd, f/u lab next visit

## 2014-10-12 NOTE — Assessment & Plan Note (Signed)

## 2014-11-04 ENCOUNTER — Telehealth: Payer: Self-pay | Admitting: Internal Medicine

## 2014-11-04 NOTE — Telephone Encounter (Signed)
Pharmacy updated in chart

## 2014-11-04 NOTE — Telephone Encounter (Signed)
Pt called in said that he is getting all his med though:   [facility]  Fax number [phone]  Order# [id number]

## 2014-11-15 ENCOUNTER — Other Ambulatory Visit: Payer: Self-pay | Admitting: *Deleted

## 2014-11-15 MED ORDER — LOSARTAN POTASSIUM 100 MG PO TABS: 100.0000 mg | ORAL_TABLET | Freq: Every day | ORAL | Status: DC

## 2014-11-15 MED ORDER — AMLODIPINE BESYLATE 5 MG PO TABS: 5.0000 mg | ORAL_TABLET | Freq: Every day | ORAL | Status: DC

## 2014-11-15 MED ORDER — ESCITALOPRAM OXALATE 20 MG PO TABS: 20.0000 mg | ORAL_TABLET | Freq: Every day | ORAL | Status: DC

## 2014-11-15 MED ORDER — ATORVASTATIN CALCIUM 10 MG PO TABS: 10.0000 mg | ORAL_TABLET | Freq: Every day | ORAL | Status: DC

## 2015-03-08 ENCOUNTER — Other Ambulatory Visit (INDEPENDENT_AMBULATORY_CARE_PROVIDER_SITE_OTHER): Payer: 59

## 2015-03-08 DIAGNOSIS — I10 Essential (primary) hypertension: Secondary | ICD-10-CM | POA: Diagnosis not present

## 2015-03-08 DIAGNOSIS — E785 Hyperlipidemia, unspecified: Secondary | ICD-10-CM

## 2015-03-08 LAB — LDL CHOLESTEROL, DIRECT: Direct LDL: 102 mg/dL

## 2015-03-08 LAB — HEPATIC FUNCTION PANEL
ALK PHOS: 84 U/L (ref 39–117)
ALT: 33 U/L (ref 0–53)
AST: 23 U/L (ref 0–37)
Albumin: 4.6 g/dL (ref 3.5–5.2)
BILIRUBIN DIRECT: 0.1 mg/dL (ref 0.0–0.3)
BILIRUBIN TOTAL: 0.6 mg/dL (ref 0.2–1.2)
Total Protein: 7.9 g/dL (ref 6.0–8.3)

## 2015-03-08 LAB — BASIC METABOLIC PANEL
BUN: 14 mg/dL (ref 6–23)
CO2: 28 mEq/L (ref 19–32)
Calcium: 9.5 mg/dL (ref 8.4–10.5)
Chloride: 102 mEq/L (ref 96–112)
Creatinine, Ser: 0.96 mg/dL (ref 0.40–1.50)
GFR: 93.29 mL/min (ref >60.00)
Glucose, Bld: 90 mg/dL (ref 70–99)
Potassium: 4.2 mEq/L (ref 3.5–5.1)
SODIUM: 137 meq/L (ref 135–145)

## 2015-03-08 LAB — LIPID PANEL
CHOLESTEROL: 170 mg/dL (ref 0–200)
HDL: 31.3 mg/dL — ABNORMAL LOW (ref >39.00)
NONHDL: 138.7
Total CHOL/HDL Ratio: 5
Triglycerides: 219 mg/dL — ABNORMAL HIGH (ref 0.0–149.0)
VLDL: 43.8 mg/dL — ABNORMAL HIGH (ref 0.0–40.0)

## 2015-03-15 ENCOUNTER — Ambulatory Visit (INDEPENDENT_AMBULATORY_CARE_PROVIDER_SITE_OTHER): Payer: 59 | Admitting: Internal Medicine

## 2015-03-15 ENCOUNTER — Encounter: Payer: Self-pay | Admitting: Internal Medicine

## 2015-03-15 VITALS — BP 146/92 | HR 69 | Temp 97.9°F | Ht 71.0 in | Wt 374.0 lb

## 2015-03-15 DIAGNOSIS — F419 Anxiety disorder, unspecified: Secondary | ICD-10-CM

## 2015-03-15 DIAGNOSIS — I10 Essential (primary) hypertension: Secondary | ICD-10-CM | POA: Diagnosis not present

## 2015-03-15 DIAGNOSIS — Z0189 Encounter for other specified special examinations: Secondary | ICD-10-CM

## 2015-03-15 DIAGNOSIS — E785 Hyperlipidemia, unspecified: Secondary | ICD-10-CM | POA: Diagnosis not present

## 2015-03-15 DIAGNOSIS — Z0000 Encounter for general adult medical examination without abnormal findings: Secondary | ICD-10-CM

## 2015-03-15 MED ORDER — AMLODIPINE BESYLATE 10 MG PO TABS: 10.0000 mg | ORAL_TABLET | Freq: Every day | ORAL | Status: DC

## 2015-03-15 MED ORDER — ATORVASTATIN CALCIUM 20 MG PO TABS: 20.0000 mg | ORAL_TABLET | Freq: Every day | ORAL | Status: DC

## 2015-03-15 NOTE — Assessment & Plan Note (Signed)
.  miild uncontrooled persistent, o/w stable overall by history and exam, recent data reviewed with pt, and pt to continue medical treatment as before except incr the amlod to 10 qd,  to f/u any worsening symptoms or concerns BP Readings from Last 3 Encounters:  03/15/15 146/92  10/12/14 150/100  10/06/13 140/90

## 2015-03-15 NOTE — Patient Instructions (Signed)
OK to increase the anmlodipine to 10 mg per day  OK to increase the generic Lipitor to 20 mg per day  Please continue all other medications as before, and refills have been done if requested.  Please have the pharmacy call with any other refills you may need.  Please continue your efforts at being more active, low cholesterol diet, and weight control.  You are otherwise up to date with prevention measures today.  Please keep your appointments with your specialists as you may have planned  Please return in 6 months, or sooner if needed, with Lab testing done 3-5 days before

## 2015-03-15 NOTE — Progress Notes (Signed)
Pre visit review using our clinic review tool, if applicable. No additional management support is needed unless otherwise documented below in the visit note. 

## 2015-03-15 NOTE — Progress Notes (Signed)
Subjective:    Patient ID: Kyle Levy, male    DOB: 1976-06-23, 39 y.o.   MRN: [id number]  HPI  Here to f/u; overall doing ok,  Pt denies chest pain, increasing sob or doe, wheezing, orthopnea, PND, increased LE swelling, palpitations, dizziness or syncope.  Pt denies new neurological symptoms such as new headache, or facial or extremity weakness or numbness.  Pt denies polydipsia, polyuria, or low sugar episode.   Pt denies new neurological symptoms such as new headache, or facial or extremity weakness or numbness.   Pt states overall good compliance with meds, mostly trying to follow appropriate diet, with wt overall stable, hard to lose though going to gym 3-4 times per wk.  Has not been riding his outside bike as much recently.  No new complaints  Denies worsening depressive symptoms, suicidal ideation, or panic;  Past Medical History  Diagnosis Date  . Hypertension   . HTN (hypertension) 08/27/2011  . Hyperlipidemia 08/27/2011  . Anxiety 08/27/2011   Past Surgical History  Procedure Laterality Date  . Knee arthroscopy      bilateral knee    reports that he has never smoked. He does not have any smokeless tobacco history on file. He reports that he drinks alcohol. He reports that he does not use illicit drugs. family history includes Heart disease in his father; Hypertension in his father; Stroke in his father. No Known Allergies Current Outpatient Prescriptions on File Prior to Visit  Medication Sig Dispense Refill  . amLODipine (NORVASC) 5 MG tablet Take 1 tablet (5 mg total) by mouth daily. 90 tablet 3  . atorvastatin (LIPITOR) 10 MG tablet Take 1 tablet (10 mg total) by mouth daily. 90 tablet 3  . escitalopram (LEXAPRO) 20 MG tablet Take 1 tablet (20 mg total) by mouth daily. 90 tablet 3  . losartan (COZAAR) 100 MG tablet Take 1 tablet (100 mg total) by mouth daily. 90 tablet 3  . Multiple Vitamin (MULTIVITAMIN) tablet Take 1 tablet by mouth daily.      . Omega-3 Fatty Acids  (FISH OIL PO) Take by mouth daily.    Marland Kitchen triamcinolone cream (KENALOG) 0.1 % Apply 1 application topically 2 (two) times daily. 30 g 0  . Red Yeast Rice Extract (RED YEAST RICE PO) Take by mouth 2 (two) times daily.     No current facility-administered medications on file prior to visit.    Review of Systems  Constitutional: Negative for unusual diaphoresis or night sweats HENT: Negative for ringing in ear or discharge Eyes: Negative for double vision or worsening visual disturbance.  Respiratory: Negative for choking and stridor.   Gastrointestinal: Negative for vomiting or other signifcant bowel change Genitourinary: Negative for hematuria or change in urine volume.  Musculoskeletal: Negative for other MSK pain or swelling Skin: Negative for color change and worsening wound.  Neurological: Negative for tremors and numbness other than noted  Psychiatric/Behavioral: Negative for decreased concentration or agitation other than above       Objective:   Physical Exam BP 146/92 mmHg  Pulse 69  Temp(Src) 97.9 F (36.6 C) (Oral)  Ht 5\' 11"  (1.803 m)  Wt 374 lb (169.645 kg)  BMI 52.19 kg/m2  SpO2 96% VS noted,  Constitutional: Pt appears in no significant distress HENT: Head: NCAT.  Right Ear: External ear normal.  Left Ear: External ear normal.  Eyes: . Pupils are equal, round, and reactive to light. Conjunctivae and EOM are normal Neck: Normal range of motion. Neck supple.  Cardiovascular: Normal rate and regular rhythm.   Pulmonary/Chest: Effort normal and breath sounds without rales or wheezing.  Abd:  Soft, NT, ND, + BS Neurological: Pt is alert. Not confused , motor grossly intact Skin: Skin is warm. No rash, no LE edema Psychiatric: Pt behavior is normal. No agitation.  not anxious appearing      Assessment & Plan:

## 2015-03-15 NOTE — Assessment & Plan Note (Signed)
stable overall by history and exam, recent data reviewed with pt, and pt to continue medical treatment as before,  to f/u any worsening symptoms or concerns Lab Results  Component Value Date   WBC 8.9 10/12/2014   HGB 15.1 10/12/2014   HCT 44.8 10/12/2014   PLT 319.0 10/12/2014   GLUCOSE 90 03/08/2015   CHOL 170 03/08/2015   TRIG 219.0* 03/08/2015   HDL 31.30* 03/08/2015   LDLDIRECT 102.0 03/08/2015   ALT 33 03/08/2015   AST 23 03/08/2015   NA 137 03/08/2015   K 4.2 03/08/2015   CL 102 03/08/2015   CREATININE 0.96 03/08/2015   BUN 14 03/08/2015   CO2 28 03/08/2015   TSH 1.34 10/12/2014

## 2015-03-15 NOTE — Assessment & Plan Note (Signed)
stable overall by history and exam, recent data reviewed with pt,  to f/u any worsening symptoms or concerns LDL 102  Goal < 100 - to incresae the lipitor to 20

## 2015-04-12 ENCOUNTER — Ambulatory Visit: Payer: 59 | Admitting: Internal Medicine

## 2015-04-15 ENCOUNTER — Telehealth: Payer: Self-pay | Admitting: Internal Medicine

## 2015-04-15 NOTE — Telephone Encounter (Signed)
Patient stated that he droped forms to be fill out 7/11 and faxed back to him, and haven't heard anything yet. please advise

## 2015-04-29 ENCOUNTER — Telehealth: Payer: Self-pay | Admitting: Internal Medicine

## 2015-04-29 NOTE — Telephone Encounter (Signed)
Pt called in and needs refill on his atorvastation and his Amlodipine .  He said that pharmacy

## 2015-05-01 ENCOUNTER — Other Ambulatory Visit: Payer: Self-pay | Admitting: Internal Medicine

## 2015-05-03 ENCOUNTER — Other Ambulatory Visit: Payer: Self-pay

## 2015-05-03 MED ORDER — AMLODIPINE BESYLATE 10 MG PO TABS: 10.0000 mg | ORAL_TABLET | Freq: Every day | ORAL | Status: DC

## 2015-05-03 MED ORDER — ESCITALOPRAM OXALATE 20 MG PO TABS: 20.0000 mg | ORAL_TABLET | Freq: Every day | ORAL | Status: DC

## 2015-05-03 MED ORDER — ATORVASTATIN CALCIUM 20 MG PO TABS: 20.0000 mg | ORAL_TABLET | Freq: Every day | ORAL | Status: DC

## 2015-09-05 ENCOUNTER — Other Ambulatory Visit (INDEPENDENT_AMBULATORY_CARE_PROVIDER_SITE_OTHER): Payer: 59

## 2015-09-05 DIAGNOSIS — Z0189 Encounter for other specified special examinations: Secondary | ICD-10-CM

## 2015-09-05 DIAGNOSIS — Z0000 Encounter for general adult medical examination without abnormal findings: Secondary | ICD-10-CM

## 2015-09-05 LAB — URINALYSIS, ROUTINE W REFLEX MICROSCOPIC
Bilirubin Urine: NEGATIVE
Hgb urine dipstick: NEGATIVE
KETONES UR: NEGATIVE
Leukocytes, UA: NEGATIVE
Nitrite: NEGATIVE
PH: 6 (ref 5.0–8.0)
RBC / HPF: NONE SEEN (ref 0–?)
Specific Gravity, Urine: 1.02 (ref 1.000–1.030)
TOTAL PROTEIN, URINE-UPE24: NEGATIVE
URINE GLUCOSE: NEGATIVE
Urobilinogen, UA: 0.2 (ref 0.0–1.0)
WBC, UA: NONE SEEN (ref 0–?)

## 2015-09-05 LAB — LIPID PANEL
Cholesterol: 154 mg/dL (ref 0–200)
HDL: 35 mg/dL — ABNORMAL LOW (ref >39.00)
LDL CALC: 83 mg/dL (ref 0–99)
NonHDL: 118.87
Total CHOL/HDL Ratio: 4
Triglycerides: 179 mg/dL — ABNORMAL HIGH (ref 0.0–149.0)
VLDL: 35.8 mg/dL (ref 0.0–40.0)

## 2015-09-05 LAB — HEPATIC FUNCTION PANEL
ALBUMIN: 4.7 g/dL (ref 3.5–5.2)
ALK PHOS: 89 U/L (ref 39–117)
ALT: 39 U/L (ref 0–53)
AST: 32 U/L (ref 0–37)
Bilirubin, Direct: 0 mg/dL (ref 0.0–0.3)
Total Bilirubin: 0.6 mg/dL (ref 0.2–1.2)
Total Protein: 8.1 g/dL (ref 6.0–8.3)

## 2015-09-05 LAB — BASIC METABOLIC PANEL
BUN: 15 mg/dL (ref 6–23)
CO2: 28 mEq/L (ref 19–32)
Calcium: 9.8 mg/dL (ref 8.4–10.5)
Chloride: 101 mEq/L (ref 96–112)
Creatinine, Ser: 1.04 mg/dL (ref 0.40–1.50)
GFR: 84.84 mL/min (ref >60.00)
Glucose, Bld: 93 mg/dL (ref 70–99)
POTASSIUM: 4 meq/L (ref 3.5–5.1)
Sodium: 137 mEq/L (ref 135–145)

## 2015-09-05 LAB — CBC WITH DIFFERENTIAL/PLATELET
BASOS PCT: 0.4 % (ref 0.0–3.0)
Basophils Absolute: 0 10*3/uL (ref 0.0–0.1)
EOS ABS: 0.1 10*3/uL (ref 0.0–0.7)
Eosinophils Relative: 1.8 % (ref 0.0–5.0)
HCT: 46.4 % (ref 39.0–52.0)
HEMOGLOBIN: 15.2 g/dL (ref 13.0–17.0)
Lymphocytes Relative: 24.3 % (ref 12.0–46.0)
Lymphs Abs: 1.9 10*3/uL (ref 0.7–4.0)
MCHC: 32.8 g/dL (ref 30.0–36.0)
MCV: 84.8 fl (ref 78.0–100.0)
MONO ABS: 0.5 10*3/uL (ref 0.1–1.0)
Monocytes Relative: 6.5 % (ref 3.0–12.0)
NEUTROS ABS: 5.3 10*3/uL (ref 1.4–7.7)
Neutrophils Relative %: 67 % (ref 43.0–77.0)
PLATELETS: 329 10*3/uL (ref 150.0–400.0)
RBC: 5.48 Mil/uL (ref 4.22–5.81)
RDW: 13.6 % (ref 11.5–15.5)
WBC: 8 10*3/uL (ref 4.0–10.5)

## 2015-09-05 LAB — TSH: TSH: 2.71 u[IU]/mL (ref 0.35–4.50)

## 2015-09-14 ENCOUNTER — Ambulatory Visit (INDEPENDENT_AMBULATORY_CARE_PROVIDER_SITE_OTHER): Payer: 59 | Admitting: Internal Medicine

## 2015-09-14 ENCOUNTER — Encounter: Payer: Self-pay | Admitting: Internal Medicine

## 2015-09-14 VITALS — BP 128/82 | HR 80 | Temp 97.7°F | Ht 71.0 in | Wt 377.0 lb

## 2015-09-14 DIAGNOSIS — Z0000 Encounter for general adult medical examination without abnormal findings: Secondary | ICD-10-CM

## 2015-09-14 DIAGNOSIS — E785 Hyperlipidemia, unspecified: Secondary | ICD-10-CM | POA: Diagnosis not present

## 2015-09-14 DIAGNOSIS — I10 Essential (primary) hypertension: Secondary | ICD-10-CM

## 2015-09-14 DIAGNOSIS — L409 Psoriasis, unspecified: Secondary | ICD-10-CM

## 2015-09-14 DIAGNOSIS — Z0189 Encounter for other specified special examinations: Secondary | ICD-10-CM

## 2015-09-14 HISTORY — DX: Psoriasis, unspecified: L40.9

## 2015-09-14 HISTORY — DX: Morbid (severe) obesity due to excess calories: E66.01

## 2015-09-14 NOTE — Progress Notes (Signed)
Pre visit review using our clinic review tool, if applicable. No additional management support is needed unless otherwise documented below in the visit note. 

## 2015-09-14 NOTE — Progress Notes (Signed)
Subjective:    Patient ID: Kyle Levy, male    DOB: 06/30/1976, 39 y.o.   MRN: [id number]  HPI  Here to f/u; overall doing ok,  Pt denies chest pain, increasing sob or doe, wheezing, orthopnea, PND, increased LE swelling, palpitations, dizziness or syncope.  Pt denies new neurological symptoms such as new headache, or facial or extremity weakness or numbness.  Pt denies polydipsia, polyuria, or low sugar episode.   Pt denies new neurological symptoms such as new headache, or facial or extremity weakness or numbness.   Pt states overall good compliance with meds, mostly trying to follow appropriate diet, with wt overall stable,  but little exercise however. No current complaints, though psoriatic rash to knees and elbows slightly increased Past Medical History  Diagnosis Date  . Hypertension   . HTN (hypertension) 08/27/2011  . Hyperlipidemia 08/27/2011  . Anxiety 08/27/2011  . Psoriasis 09/14/2015  . Morbid obesity (HCC) 09/14/2015   Past Surgical History  Procedure Laterality Date  . Knee arthroscopy      bilateral knee    reports that he has never smoked. He does not have any smokeless tobacco history on file. He reports that he drinks alcohol. He reports that he does not use illicit drugs. family history includes Heart disease in his father; Hypertension in his father; Stroke in his father. No Known Allergies Current Outpatient Prescriptions on File Prior to Visit  Medication Sig Dispense Refill  . amLODipine (NORVASC) 10 MG tablet Take 1 tablet (10 mg total) by mouth daily. 90 tablet 2  . atorvastatin (LIPITOR) 20 MG tablet Take 1 tablet (20 mg total) by mouth daily. 90 tablet 3  . escitalopram (LEXAPRO) 20 MG tablet Take 1 tablet (20 mg total) by mouth daily. 90 tablet 3  . losartan (COZAAR) 100 MG tablet Take 1 tablet (100 mg total) by mouth daily. 90 tablet 3  . losartan (COZAAR) 50 MG tablet TAKE 1 TABLET (50 MG TOTAL) BY MOUTH DAILY. 90 tablet 3  . Multiple Vitamin  (MULTIVITAMIN) tablet Take 1 tablet by mouth daily.      . Omega-3 Fatty Acids (FISH OIL PO) Take by mouth daily.    . Red Yeast Rice Extract (RED YEAST RICE PO) Take by mouth 2 (two) times daily.    Marland Kitchen. triamcinolone cream (KENALOG) 0.1 % Apply 1 application topically 2 (two) times daily. 30 g 0   No current facility-administered medications on file prior to visit.   Review of Systems  Constitutional: Negative for unusual diaphoresis or night sweats HENT: Negative for ringing in ear or discharge Eyes: Negative for double vision or worsening visual disturbance.  Respiratory: Negative for choking and stridor.   Gastrointestinal: Negative for vomiting or other signifcant bowel change Genitourinary: Negative for hematuria or change in urine volume.  Musculoskeletal: Negative for other MSK pain or swelling Skin: Negative for color change and worsening wound.  Neurological: Negative for tremors and numbness other than noted  Psychiatric/Behavioral: Negative for decreased concentration or agitation other than above       Objective:   Physical Exam BP 128/82 mmHg  Pulse 80  Temp(Src) 97.7 F (36.5 C) (Oral)  Ht 5\' 11"  (1.803 m)  Wt 377 lb (171.006 kg)  BMI 52.60 kg/m2  SpO2 95% VS noted,  Constitutional: Pt appears in no significant distress HENT: Head: NCAT.  Right Ear: External ear normal.  Left Ear: External ear normal.  Eyes: . Pupils are equal, round, and reactive to light. Conjunctivae and EOM  are normal Neck: Normal range of motion. Neck supple.  Cardiovascular: Normal rate and regular rhythm.   Pulmonary/Chest: Effort normal and breath sounds without rales or wheezing.  Neurological: Pt is alert. Not confused , motor grossly intact Skin: Skin is warm. + rash scaly to knees and elbows, no LE edema Psychiatric: Pt behavior is normal. No agitation.     Assessment & Plan:

## 2015-09-14 NOTE — Patient Instructions (Signed)
Please continue all other medications as before, and refills have been done if requested.  Please have the pharmacy call with any other refills you may need.  Please continue your efforts at being more active, low cholesterol diet, and weight control.  You are otherwise up to date with prevention measures today.  Please keep your appointments with your specialists as you may have planned  Please return in 1 year for your yearly visit, or sooner if needed, with Lab testing done 3-5 days before  

## 2015-09-17 NOTE — Assessment & Plan Note (Signed)
Mild extensor surfaces only, declines topical steroid prn,  to f/u any worsening symptoms or concerns

## 2015-09-17 NOTE — Assessment & Plan Note (Signed)
D/w pt, to consider bariatric procedure,  to f/u any worsening symptoms or concerns, to cont diet/ wt loss efforts

## 2015-09-17 NOTE — Assessment & Plan Note (Signed)
stable overall by history and exam, recent data reviewed with pt, and pt to continue medical treatment as before,  to f/u any worsening symptoms or concerns Lab Results  Component Value Date   LDLCALC 83 09/05/2015

## 2015-09-17 NOTE — Assessment & Plan Note (Signed)
stable overall by history and exam, recent data reviewed with pt, and pt to continue medical treatment as before,  to f/u any worsening symptoms or concerns BP Readings from Last 3 Encounters:  09/14/15 128/82  03/15/15 146/92  10/12/14 150/100

## 2015-11-02 ENCOUNTER — Other Ambulatory Visit: Payer: Self-pay | Admitting: Internal Medicine

## 2016-02-22 ENCOUNTER — Other Ambulatory Visit: Payer: Self-pay | Admitting: Internal Medicine

## 2016-03-06 ENCOUNTER — Telehealth: Payer: Self-pay

## 2016-03-06 NOTE — Telephone Encounter (Signed)
6.13.17 Pt dropped off form from [facility] for Dr. Jonny RuizJohn to complete. Pt wants to pick up form, DO NOT fax. Please call at [phone] when ready. Placed in Kyeshia Zinn's box for review. [place]  Health Information needed to be from 09/25/2015 - 09/23/2016, ,pt last seen 09/14/2015.  Pt advised of same and scheduled appt for 04/03/2016 @ 2:15.  Form forwarded to Corrine CMA pending appt.

## 2016-04-03 ENCOUNTER — Ambulatory Visit: Payer: 59 | Admitting: Internal Medicine

## 2016-04-10 ENCOUNTER — Encounter: Payer: Self-pay | Admitting: Internal Medicine

## 2016-04-10 ENCOUNTER — Ambulatory Visit (INDEPENDENT_AMBULATORY_CARE_PROVIDER_SITE_OTHER): Payer: 59 | Admitting: Internal Medicine

## 2016-04-10 ENCOUNTER — Other Ambulatory Visit (INDEPENDENT_AMBULATORY_CARE_PROVIDER_SITE_OTHER): Payer: 59

## 2016-04-10 VITALS — BP 136/82 | HR 64 | Temp 98.0°F | Resp 20 | Wt 359.0 lb

## 2016-04-10 DIAGNOSIS — Z0000 Encounter for general adult medical examination without abnormal findings: Secondary | ICD-10-CM

## 2016-04-10 DIAGNOSIS — R7989 Other specified abnormal findings of blood chemistry: Secondary | ICD-10-CM | POA: Diagnosis not present

## 2016-04-10 LAB — HEPATIC FUNCTION PANEL
ALBUMIN: 4.6 g/dL (ref 3.5–5.2)
ALK PHOS: 93 U/L (ref 39–117)
ALT: 26 U/L (ref 0–53)
AST: 22 U/L (ref 0–37)
Bilirubin, Direct: 0.1 mg/dL (ref 0.0–0.3)
TOTAL PROTEIN: 7.7 g/dL (ref 6.0–8.3)
Total Bilirubin: 0.8 mg/dL (ref 0.2–1.2)

## 2016-04-10 LAB — CBC WITH DIFFERENTIAL/PLATELET
BASOS ABS: 0 10*3/uL (ref 0.0–0.1)
Basophils Relative: 0.5 % (ref 0.0–3.0)
EOS PCT: 1.5 % (ref 0.0–5.0)
Eosinophils Absolute: 0.1 10*3/uL (ref 0.0–0.7)
HEMATOCRIT: 43.7 % (ref 39.0–52.0)
Hemoglobin: 14.9 g/dL (ref 13.0–17.0)
LYMPHS ABS: 1.9 10*3/uL (ref 0.7–4.0)
LYMPHS PCT: 22.4 % (ref 12.0–46.0)
MCHC: 34.1 g/dL (ref 30.0–36.0)
MCV: 83.9 fl (ref 78.0–100.0)
MONOS PCT: 5.9 % (ref 3.0–12.0)
Monocytes Absolute: 0.5 10*3/uL (ref 0.1–1.0)
NEUTROS PCT: 69.7 % (ref 43.0–77.0)
Neutro Abs: 5.9 10*3/uL (ref 1.4–7.7)
Platelets: 323 10*3/uL (ref 150.0–400.0)
RBC: 5.2 Mil/uL (ref 4.22–5.81)
RDW: 13.3 % (ref 11.5–15.5)
WBC: 8.5 10*3/uL (ref 4.0–10.5)

## 2016-04-10 LAB — BASIC METABOLIC PANEL
BUN: 16 mg/dL (ref 6–23)
CO2: 29 meq/L (ref 19–32)
Calcium: 9.6 mg/dL (ref 8.4–10.5)
Chloride: 101 mEq/L (ref 96–112)
Creatinine, Ser: 0.95 mg/dL (ref 0.40–1.50)
GFR: 93.88 mL/min (ref >60.00)
GLUCOSE: 86 mg/dL (ref 70–99)
POTASSIUM: 4.2 meq/L (ref 3.5–5.1)
SODIUM: 137 meq/L (ref 135–145)

## 2016-04-10 LAB — LDL CHOLESTEROL, DIRECT: Direct LDL: 85 mg/dL

## 2016-04-10 LAB — LIPID PANEL
Cholesterol: 140 mg/dL (ref 0–200)
HDL: 27.6 mg/dL — ABNORMAL LOW (ref >39.00)
NonHDL: 112.64
Total CHOL/HDL Ratio: 5
Triglycerides: 214 mg/dL — ABNORMAL HIGH (ref 0.0–149.0)
VLDL: 42.8 mg/dL — ABNORMAL HIGH (ref 0.0–40.0)

## 2016-04-10 LAB — URINALYSIS, ROUTINE W REFLEX MICROSCOPIC
Bilirubin Urine: NEGATIVE
HGB URINE DIPSTICK: NEGATIVE
Ketones, ur: NEGATIVE
Leukocytes, UA: NEGATIVE
NITRITE: NEGATIVE
RBC / HPF: NONE SEEN (ref 0–?)
TOTAL PROTEIN, URINE-UPE24: NEGATIVE
Urine Glucose: NEGATIVE
Urobilinogen, UA: 0.2 (ref 0.0–1.0)
WBC, UA: NONE SEEN (ref 0–?)
pH: 6 (ref 5.0–8.0)

## 2016-04-10 LAB — PSA: PSA: 0.69 ng/mL (ref 0.10–4.00)

## 2016-04-10 LAB — TSH: TSH: 2.4 u[IU]/mL (ref 0.35–4.50)

## 2016-04-10 NOTE — Patient Instructions (Addendum)
Please continue all other medications as before, and refills have been done if requested.  Please have the pharmacy call with any other refills you may need.  Please continue your efforts at being more active, low cholesterol diet, and weight control.  You are otherwise up to date with prevention measures today.  Please keep your appointments with your specialists as you may have planned  Please go to the LAB in the Basement (turn left off the elevator) for the tests to be done today  You will be contacted by phone if any changes need to be made immediately.  Otherwise, you will receive a letter about your results with an explanation, but please check with MyChart first.  Please remember to sign up for MyChart if you have not done so, as this will be important to you in the future with finding out test results, communicating by private email, and scheduling acute appointments online when needed.  Please return in 1 year for your yearly visit, or sooner if needed, with Lab testing done 3-5 days before  Your form will be faxed when results are back

## 2016-04-10 NOTE — Progress Notes (Signed)
Subjective:    Patient ID: Kyle Levy, male    DOB: October 14, 1975, 40 y.o.   MRN: [id number]  HPI  Here for wellness and f/u;  Overall doing ok;  Pt denies Chest pain, worsening SOB, DOE, wheezing, orthopnea, PND, worsening LE edema, palpitations, dizziness or syncope.  Pt denies neurological change such as new headache, facial or extremity weakness.  Pt denies polydipsia, polyuria, or low sugar symptoms. Pt states overall good compliance with treatment and medications, good tolerability, and has been trying to follow appropriate diet.  Pt denies worsening depressive symptoms, suicidal ideation or panic. No fever, night sweats,  loss of appetite, or other constitutional symptoms.  Pt states good ability with ADL's, has low fall risk, home safety reviewed and adequate, no other significant changes in hearing or vision, and occasionally active with exercise.  Has really been working on reducind calories, has lost almost 20 lbs.  BP Readings from Last 3 Encounters:  04/10/16 136/82  09/14/15 128/82  03/15/15 146/92   Wt Readings from Last 3 Encounters:  04/10/16 359 lb (162.841 kg)  09/14/15 377 lb (171.006 kg)  03/15/15 374 lb (169.645 kg)   Past Medical History  Diagnosis Date  . Hypertension   . HTN (hypertension) 08/27/2011  . Hyperlipidemia 08/27/2011  . Anxiety 08/27/2011  . Psoriasis 09/14/2015  . Morbid obesity (HCC) 09/14/2015   Past Surgical History  Procedure Laterality Date  . Knee arthroscopy      bilateral knee    reports that he has never smoked. He does not have any smokeless tobacco history on file. He reports that he drinks alcohol. He reports that he does not use illicit drugs. family history includes Heart disease in his father; Hypertension in his father; Stroke in his father. No Known Allergies Current Outpatient Prescriptions on File Prior to Visit  Medication Sig Dispense Refill  . amLODipine (NORVASC) 10 MG tablet Take 1 tablet (10 mg total) by mouth daily. 90  tablet 2  . atorvastatin (LIPITOR) 20 MG tablet Take 1 tablet (20 mg total) by mouth daily. 90 tablet 3  . escitalopram (LEXAPRO) 20 MG tablet Take 1 tablet by mouth  daily 90 tablet 0  . losartan (COZAAR) 100 MG tablet Take 1 tablet by mouth  daily 90 tablet 0  . losartan (COZAAR) 50 MG tablet TAKE 1 TABLET (50 MG TOTAL) BY MOUTH DAILY. 90 tablet 3  . Multiple Vitamin (MULTIVITAMIN) tablet Take 1 tablet by mouth daily.      . Omega-3 Fatty Acids (FISH OIL PO) Take by mouth daily.    . Red Yeast Rice Extract (RED YEAST RICE PO) Take by mouth 2 (two) times daily.    Marland Kitchen triamcinolone cream (KENALOG) 0.1 % Apply 1 application topically 2 (two) times daily. 30 g 0   No current facility-administered medications on file prior to visit.    Review of Systems Constitutional: Negative for increased diaphoresis, or other activity, appetite or siginficant weight change other than noted HENT: Negative for worsening hearing loss, ear pain, facial swelling, mouth sores and neck stiffness.   Eyes: Negative for other worsening pain, redness or visual disturbance.  Respiratory: Negative for choking or stridor Cardiovascular: Negative for other chest pain and palpitations.  Gastrointestinal: Negative for worsening diarrhea, blood in stool, or abdominal distention Genitourinary: Negative for hematuria, flank pain or change in urine volume.  Musculoskeletal: Negative for myalgias or other joint complaints.  Skin: Negative for other color change and wound or drainage.  Neurological: Negative for  syncope and numbness. other than noted Hematological: Negative for adenopathy. or other swelling Psychiatric/Behavioral: Negative for hallucinations, SI, self-injury, decreased concentration or other worsening agitation.      Objective:   Physical Exam BP 136/82 mmHg  Pulse 64  Temp(Src) 98 F (36.7 C) (Oral)  Resp 20  Wt 359 lb (162.841 kg)  SpO2 96% VS noted, morbid obese Constitutional: Pt is oriented to  person, place, and time. Appears well-developed and well-nourished, in no significant distress Head: Normocephalic and atraumatic  Eyes: Conjunctivae and EOM are normal. Pupils are equal, round, and reactive to light Right Ear: External ear normal.  Left Ear: External ear normal Nose: Nose normal.  Mouth/Throat: Oropharynx is clear and moist  Neck: Normal range of motion. Neck supple. No JVD present. No tracheal deviation present or significant neck LA or mass Cardiovascular: Normal rate, regular rhythm, normal heart sounds and intact distal pulses.   Pulmonary/Chest: Effort normal and breath sounds without rales or wheezing  Abdominal: Soft. Bowel sounds are normal. NT. No HSM  Musculoskeletal: Normal range of motion. Exhibits no edema Lymphadenopathy: Has no cervical adenopathy.  Neurological: Pt is alert and oriented to person, place, and time. Pt has normal reflexes. No cranial nerve deficit. Motor grossly intact Skin: Skin is warm and dry. No rash noted or new ulcers Psychiatric:  Has normal mood and affect. Behavior is normal.   Lab Results  Component Value Date   WBC 8.0 09/05/2015   HGB 15.2 09/05/2015   HCT 46.4 09/05/2015   PLT 329.0 09/05/2015   GLUCOSE 93 09/05/2015   CHOL 154 09/05/2015   TRIG 179.0* 09/05/2015   HDL 35.00* 09/05/2015   LDLDIRECT 102.0 03/08/2015   LDLCALC 83 09/05/2015   ALT 39 09/05/2015   AST 32 09/05/2015   NA 137 09/05/2015   K 4.0 09/05/2015   CL 101 09/05/2015   CREATININE 1.04 09/05/2015   BUN 15 09/05/2015   CO2 28 09/05/2015   TSH 2.71 09/05/2015       Assessment & Plan:

## 2016-04-10 NOTE — Assessment & Plan Note (Signed)

## 2016-04-10 NOTE — Progress Notes (Signed)
Pre visit review using our clinic review tool, if applicable. No additional management support is needed unless otherwise documented below in the visit note. 

## 2016-04-16 ENCOUNTER — Other Ambulatory Visit: Payer: Self-pay | Admitting: Internal Medicine

## 2016-05-12 ENCOUNTER — Other Ambulatory Visit: Payer: Self-pay | Admitting: Internal Medicine

## 2016-06-08 MED FILL — ESCITALOPRAM 20 MG TABLET: 20 | 90 days supply | Qty: 90 | Fill #0

## 2016-06-08 MED FILL — LOSARTAN POTASSIUM 100 MG T: 100 | 90 days supply | Qty: 90 | Fill #0

## 2016-06-11 ENCOUNTER — Encounter: Payer: Self-pay | Admitting: Internal Medicine

## 2016-06-12 ENCOUNTER — Telehealth: Payer: Self-pay

## 2016-06-12 MED ORDER — AMLODIPINE BESYLATE 10 MG PO TABS: 10.0000 mg | ORAL_TABLET | Freq: Every day | ORAL | 0 refills | Status: DC

## 2016-06-12 MED ORDER — ATORVASTATIN CALCIUM 20 MG PO TABS: 20.0000 mg | ORAL_TABLET | Freq: Every day | ORAL | 0 refills | Status: DC

## 2016-06-12 NOTE — Telephone Encounter (Signed)
Medication refills sent to pharmacy 

## 2016-07-05 ENCOUNTER — Other Ambulatory Visit: Payer: Self-pay | Admitting: Internal Medicine

## 2016-07-30 ENCOUNTER — Telehealth: Payer: Self-pay | Admitting: Emergency Medicine

## 2016-07-30 ENCOUNTER — Other Ambulatory Visit: Payer: Self-pay | Admitting: Internal Medicine

## 2016-07-30 DIAGNOSIS — G4710 Hypersomnia, unspecified: Secondary | ICD-10-CM

## 2016-07-30 NOTE — Telephone Encounter (Signed)
Does he have any symptoms such as snoring at night, family saying he stops breathing for seconds with sleep,  waking up feeling not rested, or trying to fall asleep several times during the day  If not, he would not really need the testing

## 2016-07-30 NOTE — Telephone Encounter (Signed)
Please advise patient states that he does severely snore as well as he does wake up feeling like he hasnt been able to sleep.

## 2016-07-30 NOTE — Telephone Encounter (Signed)
Pt called and stated his family has had some recent heart problems due to sleep apnea. He is wondering if he can have a sleep study done? Please advise thanks.

## 2016-08-13 MED FILL — AMLODIPINE BESYLATE 10 MG T: 10 | 90 days supply | Qty: 90 | Fill #0

## 2016-08-13 MED FILL — ATORVASTATIN 20 MG TABLET: 20 | 90 days supply | Qty: 90 | Fill #0

## 2016-09-09 MED FILL — ESCITALOPRAM 20 MG TABLET: 20 | 90 days supply | Qty: 90 | Fill #1

## 2016-09-09 MED FILL — LOSARTAN POTASSIUM 100 MG T: 100 | 90 days supply | Qty: 90 | Fill #1

## 2016-09-13 ENCOUNTER — Encounter: Payer: 59 | Admitting: Internal Medicine

## 2016-10-03 ENCOUNTER — Institutional Professional Consult (permissible substitution): Payer: 59 | Admitting: Pulmonary Disease

## 2016-11-12 ENCOUNTER — Other Ambulatory Visit: Payer: Self-pay | Admitting: Internal Medicine

## 2016-11-12 MED FILL — ATORVASTATIN 20 MG TABLET: 20 | 90 days supply | Qty: 90 | Fill #0

## 2016-11-12 MED FILL — AMLODIPINE BESYLATE 10 MG T: 10 | 90 days supply | Qty: 90 | Fill #0

## 2016-11-22 ENCOUNTER — Institutional Professional Consult (permissible substitution): Payer: 59 | Admitting: Pulmonary Disease

## 2016-12-07 MED FILL — ESCITALOPRAM 20 MG TABLET: 20 | 90 days supply | Qty: 90 | Fill #0

## 2016-12-07 MED FILL — LOSARTAN POTASSIUM 100 MG T: 100 | 90 days supply | Qty: 90 | Fill #0

## 2017-01-04 ENCOUNTER — Institutional Professional Consult (permissible substitution): Payer: 59 | Admitting: Pulmonary Disease

## 2017-02-05 MED FILL — AMLODIPINE BESYLATE 10 MG T: 10 | 90 days supply | Qty: 90 | Fill #1

## 2017-02-06 MED FILL — ATORVASTATIN 20 MG TABLET: 20 | 90 days supply | Qty: 90 | Fill #1

## 2017-03-05 ENCOUNTER — Other Ambulatory Visit: Payer: Self-pay | Admitting: Internal Medicine

## 2017-03-05 MED FILL — LOSARTAN POTASSIUM 100 MG T: 100 | 90 days supply | Qty: 90 | Fill #1

## 2017-03-05 MED FILL — ESCITALOPRAM 20 MG TABLET: 20 | 90 days supply | Qty: 90 | Fill #0

## 2017-03-05 NOTE — Telephone Encounter (Signed)
Refill lexapro done,   Ok for shirron to let pt know to make ROV about July for further reiflls

## 2017-03-05 NOTE — Telephone Encounter (Signed)
Pt has been informed and I seen in chart he already has an appt scheduled.

## 2017-03-07 ENCOUNTER — Ambulatory Visit (INDEPENDENT_AMBULATORY_CARE_PROVIDER_SITE_OTHER): Payer: 59 | Admitting: Internal Medicine

## 2017-03-07 ENCOUNTER — Encounter: Payer: Self-pay | Admitting: Internal Medicine

## 2017-03-07 VITALS — BP 132/82 | HR 79 | Resp 16 | Ht 71.0 in | Wt 374.4 lb

## 2017-03-07 DIAGNOSIS — G4733 Obstructive sleep apnea (adult) (pediatric): Secondary | ICD-10-CM | POA: Insufficient documentation

## 2017-03-07 NOTE — Assessment & Plan Note (Signed)
Tentative diagnosis based on history and physical exam with high probability. Both parents use CPAP so he is aware of many of the issues. Educated on basic sleep hygiene, responsibility to drive safely, importance of normal weight, fundamental's of obstructive sleep apnea and potential for CPAP as an option. Plan-schedule sleep study

## 2017-03-07 NOTE — Patient Instructions (Signed)
Order- schedule unattended home sleep study   Please call us after your sleep study so we know it has been done and can discuss what to do.

## 2017-03-07 NOTE — Progress Notes (Signed)
03/07/17-41 year old male never smoker for sleep consultation.Patient's wife states that he snores very loudly she sleeps in the other room  He is not aware of excessive daytime sleepiness but has been told of witnessed apnea events. No movement disorder parasomnia and sleep. Both parents use CPAP. Bedtime around 10 or 11 PM with short sleep latency, up at 5:30 AM. Weight up 30 pounds in recent years. Broken nose when young but breathes through with his mouth closed comfortably. Denies heart or lung problems. Epworth sleepiness score 5/24  Prior to Admission medications   Medication Sig Start Date End Date Taking? Authorizing Provider  amLODipine (NORVASC) 10 MG tablet TAKE 1 TABLET BY MOUTH DAILY. 11/12/16   Corwin Levins, MD  atorvastatin (LIPITOR) 20 MG tablet TAKE 1 TABLET BY MOUTH DAILY. 11/12/16   Corwin Levins, MD  escitalopram (LEXAPRO) 20 MG tablet TAKE 1 TABLET BY MOUTH ONCE DAILY 03/05/17   Corwin Levins, MD  losartan (COZAAR) 100 MG tablet Take 1 tablet by mouth  daily 05/14/16   Corwin Levins, MD  losartan (COZAAR) 50 MG tablet TAKE 1 TABLET (50 MG TOTAL) BY MOUTH DAILY. 05/02/15   Corwin Levins, MD  Multiple Vitamin (MULTIVITAMIN) tablet Take 1 tablet by mouth daily.      [provider]  Omega-3 Fatty Acids (FISH OIL PO) Take by mouth daily.    [provider]   Past Medical History:  Diagnosis Date  . Anxiety 08/27/2011  . HTN (hypertension) 08/27/2011  . Hyperlipidemia 08/27/2011  . Hypertension   . Morbid obesity (HCC) 09/14/2015  . Psoriasis 09/14/2015   Past Surgical History:  Procedure Laterality Date  . KNEE ARTHROSCOPY     bilateral knee   Family History  Problem Relation Age of Onset  . Heart disease Father   . Hypertension Father   . Stroke Father    Social History   Social History  . Marital status: Married    Spouse name: N/A  . Number of children: N/A  . Years of education: 4   Occupational History  . Surveyor, [employer]    Social  History Main Topics  . Smoking status: Never Smoker  . Smokeless tobacco: Never Used  . Alcohol use Yes     Comment: social  . Drug use: No  . Sexual activity: Not on file   Other Topics Concern  . Not on file   Social History Narrative  . No narrative on file   ROS-see HPI   Negative unless "+" Constitutional:    weight loss, night sweats, fevers, chills, fatigue, lassitude. HEENT:    headaches, difficulty swallowing, tooth/dental problems, sore throat,      + sneezing, itching, ear ache, + nasal congestion, post nasal drip, snoring CV:    chest pain, orthopnea, PND, swelling in lower extremities, anasarca,                                                      dizziness, palpitations Resp:   shortness of breath with exertion or at rest.                productive cough,   non-productive cough, coughing up of blood.              change in color of mucus.  wheezing.   Skin:  rash or lesions. GI:  No-   heartburn, indigestion, abdominal pain, nausea, vomiting, diarrhea,                 change in bowel habits, loss of appetite GU: dysuria, change in color of urine, no urgency or frequency.   flank pain. MS:   joint pain, stiffness, decreased range of motion, back pain. Neuro-     nothing unusual Psych:  change in mood or affect.  depression or anxiety.   memory loss.  OBJ- Physical Exam General- Alert, Oriented, Affect-appropriate, Distress- none acute + morbidly obese/big man Skin- rash-none, lesions- none, excoriation- none Lymphadenopathy- none Head- atraumatic            Eyes- Gross vision intact, PERRLA, conjunctivae and secretions clear            Ears- Hearing, canals-normal            Nose- Clear, no-Septal dev, mucus, polyps, erosion, perforation             Throat- Mallampati III-IV , mucosa clear , drainage- none, tonsils- atrophic Neck- flexible , trachea midline, no stridor , thyroid nl, carotid no bruit Chest - symmetrical excursion , unlabored           Heart/CV-  RRR , no murmur , no gallop  , no rub, nl s1 s2                           - JVD- none , edema- none, stasis changes- none, varices- none           Lung- clear to P&A, wheeze- none, cough- none , dullness-none, rub- none           Chest wall-  Abd-  Br/ Gen/ Rectal- Not done, not indicated Extrem- cyanosis- none, clubbing, none, atrophy- none, strength- nl Neuro- grossly intact to observation

## 2017-03-07 NOTE — Assessment & Plan Note (Signed)
Weight loss would help substantially. Consider bariatric referral.

## 2017-04-01 DIAGNOSIS — G4733 Obstructive sleep apnea (adult) (pediatric): Secondary | ICD-10-CM

## 2017-04-05 DIAGNOSIS — G4733 Obstructive sleep apnea (adult) (pediatric): Secondary | ICD-10-CM | POA: Diagnosis not present

## 2017-04-16 ENCOUNTER — Encounter: Payer: 59 | Admitting: Internal Medicine

## 2017-05-03 ENCOUNTER — Ambulatory Visit (INDEPENDENT_AMBULATORY_CARE_PROVIDER_SITE_OTHER): Payer: 59 | Admitting: Internal Medicine

## 2017-05-03 ENCOUNTER — Encounter: Payer: Self-pay | Admitting: Internal Medicine

## 2017-05-03 VITALS — BP 124/78 | HR 65 | Ht 71.0 in | Wt 380.0 lb

## 2017-05-03 DIAGNOSIS — Z114 Encounter for screening for human immunodeficiency virus [HIV]: Secondary | ICD-10-CM | POA: Diagnosis not present

## 2017-05-03 DIAGNOSIS — Z0000 Encounter for general adult medical examination without abnormal findings: Secondary | ICD-10-CM

## 2017-05-03 DIAGNOSIS — I10 Essential (primary) hypertension: Secondary | ICD-10-CM | POA: Diagnosis not present

## 2017-05-03 MED ORDER — AMLODIPINE BESYLATE 10 MG PO TABS: 10.0000 mg | ORAL_TABLET | Freq: Every day | ORAL | 3 refills | Status: DC

## 2017-05-03 MED ORDER — ESCITALOPRAM OXALATE 20 MG PO TABS: 20.0000 mg | ORAL_TABLET | Freq: Every day | ORAL | 3 refills | Status: DC

## 2017-05-03 MED ORDER — ATORVASTATIN CALCIUM 20 MG PO TABS: 20.0000 mg | ORAL_TABLET | Freq: Every day | ORAL | 3 refills | Status: DC

## 2017-05-03 MED ORDER — LOSARTAN POTASSIUM 100 MG PO TABS: 100.0000 mg | ORAL_TABLET | Freq: Every day | ORAL | 3 refills | Status: DC

## 2017-05-03 NOTE — Patient Instructions (Signed)

## 2017-05-03 NOTE — Progress Notes (Signed)
Subjective:    Patient ID: Kyle Levy, male    DOB: 12/04/1975, 41 y.o.   MRN: [id number]  HPI   Here for wellness and f/u;  Overall doing ok;  Pt denies Chest pain, worsening SOB, DOE, wheezing, orthopnea, PND, worsening LE edema, palpitations, dizziness or syncope.  Pt denies neurological change such as new headache, facial or extremity weakness.  Pt denies polydipsia, polyuria, or low sugar symptoms. Pt states overall good compliance with treatment and medications, good tolerability, and has been trying to follow appropriate diet.  Pt denies worsening depressive symptoms, suicidal ideation or panic. No fever, night sweats, wt loss, loss of appetite, or other constitutional symptoms.  Pt states good ability with ADL's, has low fall risk, home safety reviewed and adequate, no other significant changes in hearing or vision, and active with exercise with walking 1 mile per day most days of the week  Needs med refills Past Medical History:  Diagnosis Date  . Anxiety 08/27/2011  . HTN (hypertension) 08/27/2011  . Hyperlipidemia 08/27/2011  . Hypertension   . Morbid obesity (HCC) 09/14/2015  . Psoriasis 09/14/2015   Past Surgical History:  Procedure Laterality Date  . KNEE ARTHROSCOPY     bilateral knee    reports that he has never smoked. He has never used smokeless tobacco. He reports that he drinks alcohol. He reports that he does not use drugs. family history includes Heart disease in his father; Hypertension in his father; Stroke in his father. Not on File Current Outpatient Prescriptions on File Prior to Visit  Medication Sig Dispense Refill  . Multiple Vitamin (MULTIVITAMIN) tablet Take 1 tablet by mouth daily.      . Omega-3 Fatty Acids (FISH OIL PO) Take by mouth daily.     No current facility-administered medications on file prior to visit.    Review of Systems Constitutional: Negative for other unusual diaphoresis, sweats, appetite or weight changes HENT: Negative for other  worsening hearing loss, ear pain, facial swelling, mouth sores or neck stiffness.   Eyes: Negative for other worsening pain, redness or other visual disturbance.  Respiratory: Negative for other stridor or swelling Cardiovascular: Negative for other palpitations or other chest pain  Gastrointestinal: Negative for worsening diarrhea or loose stools, blood in stool, distention or other pain Genitourinary: Negative for hematuria, flank pain or other change in urine volume.  Musculoskeletal: Negative for myalgias or other joint swelling.  Skin: Negative for other color change, or other wound or worsening drainage.  Neurological: Negative for other syncope or numbness. Hematological: Negative for other adenopathy or swelling Psychiatric/Behavioral: Negative for hallucinations, other worsening agitation, SI, self-injury, or new decreased concentration    Objective:   Physical Exam BP 124/78   Pulse 65   Ht 5\' 11"  (1.803 m)   Wt (!) 380 lb (172.4 kg)   SpO2 98%   BMI 53.00 kg/m  VS noted, morbid obese Constitutional: Pt is oriented to person, place, and time. Appears well-developed and well-nourished, in no significant distress and comfortable Head: Normocephalic and atraumatic  Eyes: Conjunctivae and EOM are normal. Pupils are equal, round, and reactive to light Right Ear: External ear normal without discharge Left Ear: External ear normal without discharge Nose: Nose without discharge or deformity Mouth/Throat: Oropharynx is without other ulcerations and moist  Neck: Normal range of motion. Neck supple. No JVD present. No tracheal deviation present or significant neck LA or mass Cardiovascular: Normal rate, regular rhythm, normal heart sounds and intact distal pulses.   Pulmonary/Chest:  WOB normal and breath sounds without rales or wheezing  Abdominal: Soft. Bowel sounds are normal. NT. No HSM  Musculoskeletal: Normal range of motion. Exhibits no edema Lymphadenopathy: Has no other  cervical adenopathy.  Neurological: Pt is alert and oriented to person, place, and time. Pt has normal reflexes. No cranial nerve deficit. Motor grossly intact, Gait intact Skin: Skin is warm and dry. No rash noted or new ulcerations Psychiatric:  Has normal mood and affect. Behavior is normal without agitation No other exam findings     Assessment & Plan:

## 2017-05-06 ENCOUNTER — Other Ambulatory Visit (INDEPENDENT_AMBULATORY_CARE_PROVIDER_SITE_OTHER): Payer: 59

## 2017-05-06 DIAGNOSIS — Z114 Encounter for screening for human immunodeficiency virus [HIV]: Secondary | ICD-10-CM | POA: Diagnosis not present

## 2017-05-06 DIAGNOSIS — Z0000 Encounter for general adult medical examination without abnormal findings: Secondary | ICD-10-CM

## 2017-05-06 LAB — HEPATIC FUNCTION PANEL
ALBUMIN: 4.7 g/dL (ref 3.5–5.2)
ALT: 25 U/L (ref 0–53)
AST: 21 U/L (ref 0–37)
Alkaline Phosphatase: 83 U/L (ref 39–117)
Bilirubin, Direct: 0.1 mg/dL (ref 0.0–0.3)
Total Bilirubin: 0.7 mg/dL (ref 0.2–1.2)
Total Protein: 7.2 g/dL (ref 6.0–8.3)

## 2017-05-06 LAB — LIPID PANEL
CHOLESTEROL: 143 mg/dL (ref 0–200)
HDL: 31.7 mg/dL — ABNORMAL LOW (ref >39.00)
LDL CALC: 73 mg/dL (ref 0–99)
NonHDL: 111.6
TRIGLYCERIDES: 195 mg/dL — AB (ref 0.0–149.0)
Total CHOL/HDL Ratio: 5
VLDL: 39 mg/dL (ref 0.0–40.0)

## 2017-05-06 LAB — CBC WITH DIFFERENTIAL/PLATELET
BASOS ABS: 0.1 10*3/uL (ref 0.0–0.1)
Basophils Relative: 1.1 % (ref 0.0–3.0)
EOS PCT: 3.7 % (ref 0.0–5.0)
Eosinophils Absolute: 0.2 10*3/uL (ref 0.0–0.7)
HEMATOCRIT: 42.9 % (ref 39.0–52.0)
HEMOGLOBIN: 14.6 g/dL (ref 13.0–17.0)
LYMPHS PCT: 25.4 % (ref 12.0–46.0)
Lymphs Abs: 1.5 10*3/uL (ref 0.7–4.0)
MCHC: 34.1 g/dL (ref 30.0–36.0)
MCV: 85.7 fl (ref 78.0–100.0)
MONOS PCT: 6.5 % (ref 3.0–12.0)
Monocytes Absolute: 0.4 10*3/uL (ref 0.1–1.0)
NEUTROS PCT: 63.3 % (ref 43.0–77.0)
Neutro Abs: 3.8 10*3/uL (ref 1.4–7.7)
Platelets: 310 10*3/uL (ref 150.0–400.0)
RBC: 5.01 Mil/uL (ref 4.22–5.81)
RDW: 13.5 % (ref 11.5–15.5)
WBC: 6.1 10*3/uL (ref 4.0–10.5)

## 2017-05-06 LAB — URINALYSIS, ROUTINE W REFLEX MICROSCOPIC
BILIRUBIN URINE: NEGATIVE
HGB URINE DIPSTICK: NEGATIVE
Ketones, ur: NEGATIVE
LEUKOCYTES UA: NEGATIVE
NITRITE: NEGATIVE
RBC / HPF: NONE SEEN (ref 0–?)
Specific Gravity, Urine: 1.02 (ref 1.000–1.030)
TOTAL PROTEIN, URINE-UPE24: NEGATIVE
URINE GLUCOSE: NEGATIVE
Urobilinogen, UA: 0.2 (ref 0.0–1.0)
pH: 6 (ref 5.0–8.0)

## 2017-05-06 LAB — PSA: PSA: 0.66 ng/mL (ref 0.10–4.00)

## 2017-05-06 LAB — BASIC METABOLIC PANEL
BUN: 13 mg/dL (ref 6–23)
CALCIUM: 9.5 mg/dL (ref 8.4–10.5)
CO2: 30 mEq/L (ref 19–32)
Chloride: 101 mEq/L (ref 96–112)
Creatinine, Ser: 0.95 mg/dL (ref 0.40–1.50)
GFR: 93.37 mL/min (ref >60.00)
GLUCOSE: 89 mg/dL (ref 70–99)
Potassium: 4.5 mEq/L (ref 3.5–5.1)
Sodium: 138 mEq/L (ref 135–145)

## 2017-05-06 LAB — TSH: TSH: 2.08 u[IU]/mL (ref 0.35–4.50)

## 2017-05-06 NOTE — Assessment & Plan Note (Signed)
stable overall by history and exam, recent data reviewed with pt, and pt to continue medical treatment as before,  to f/u any worsening symptoms or concerns BP Readings from Last 3 Encounters:  05/03/17 124/78  03/07/17 132/82  04/10/16 136/82

## 2017-05-06 NOTE — Assessment & Plan Note (Signed)

## 2017-05-07 LAB — HIV ANTIBODY (ROUTINE TESTING W REFLEX): HIV 1&2 Ab, 4th Generation: NONREACTIVE

## 2017-05-14 MED FILL — AMLODIPINE BESYLATE 10 MG T: 10 | 90 days supply | Qty: 90 | Fill #0

## 2017-05-14 MED FILL — ATORVASTATIN 20 MG TABLET: 20 | 90 days supply | Qty: 90 | Fill #0

## 2017-06-11 MED FILL — LOSARTAN POTASSIUM 100 MG T: 100 | 90 days supply | Qty: 90 | Fill #0

## 2017-06-11 MED FILL — ESCITALOPRAM 20 MG TABLET: 20 | 90 days supply | Qty: 90 | Fill #0

## 2017-06-12 ENCOUNTER — Encounter: Payer: Self-pay | Admitting: Internal Medicine

## 2017-06-12 MED ORDER — BUSPIRONE HCL 10 MG PO TABS: 10.0000 mg | ORAL_TABLET | Freq: Three times a day (TID) | ORAL | 5 refills | Status: DC | PRN

## 2017-06-12 MED FILL — busPIRone HCL 10 MG TABS: 10 | 30 days supply | Qty: 90 | Fill #0

## 2017-06-17 ENCOUNTER — Encounter: Payer: Self-pay | Admitting: Internal Medicine

## 2017-06-17 ENCOUNTER — Ambulatory Visit (INDEPENDENT_AMBULATORY_CARE_PROVIDER_SITE_OTHER): Payer: 59 | Admitting: Internal Medicine

## 2017-06-17 VITALS — BP 130/84 | HR 74 | Ht 71.0 in | Wt 381.0 lb

## 2017-06-17 DIAGNOSIS — G4733 Obstructive sleep apnea (adult) (pediatric): Secondary | ICD-10-CM | POA: Diagnosis not present

## 2017-06-17 NOTE — Patient Instructions (Addendum)
Order- new DME, new CPAP auto 5-20, mask of choice, humidifier, supplies, AirView     Dx OSA  Please call as needed   

## 2017-06-17 NOTE — Progress Notes (Signed)
03/07/17-41 year old male never smoker for sleep consultation.Patient's wife states that he snores very loudly she sleeps in the other room  He is not aware of excessive daytime sleepiness but has been told of witnessed apnea events. No movement disorder parasomnia and sleep. Both parents use CPAP. Bedtime around 10 or 11 PM with short sleep latency, up at 5:30 AM. Weight up 30 pounds in recent years. Broken nose when [place] but breathes through with his mouth closed comfortably. Denies heart or lung problems. Epworth sleepiness score 5/24  06/17/17- 41 year old male never smoker followed for OSA, complicated by morbid obesity Unattended Home Sleep Test 04/01/17-AHI 12.8/hour, desaturation to 80%, body weight 374 pounds Sleep study done 04/01/17 and is here today for a follow-up visit after sleep study. Education and discussion of treatment options done.  Questions answered. We will start CPAP auto 5-20.  ROS-see HPI   Negative unless "+" Constitutional:    weight loss, night sweats, fevers, chills, fatigue, lassitude. HEENT:    headaches, difficulty swallowing, tooth/dental problems, sore throat,      + sneezing, itching, ear ache, + nasal congestion, post nasal drip, snoring CV:    chest pain, orthopnea, PND, swelling in lower extremities, anasarca,                                                      dizziness, palpitations Resp:   shortness of breath with exertion or at rest.                productive cough,   non-productive cough, coughing up of blood.              change in color of mucus.  wheezing.   Skin:    rash or lesions. GI:  No-   heartburn, indigestion, abdominal pain, nausea, vomiting, diarrhea,                 change in bowel habits, loss of appetite GU: dysuria, change in color of urine, no urgency or frequency.   flank pain. MS:   joint pain, stiffness, decreased range of motion, back pain. Neuro-     nothing unusual Psych:  change in mood or affect.  depression or anxiety.    memory loss.  OBJ- Physical Exam General- Alert, Oriented, Affect-appropriate, Distress- none acute + morbidly obese/big man Skin- rash-none, lesions- none, excoriation- none Lymphadenopathy- none Head- atraumatic            Eyes- Gross vision intact, PERRLA, conjunctivae and secretions clear            Ears- Hearing, canals-normal            Nose- Clear, no-Septal dev, mucus, polyps, erosion, perforation             Throat- Mallampati III-IV , mucosa clear , drainage- none, tonsils- atrophic Neck- flexible , trachea midline, no stridor , thyroid nl, carotid no bruit Chest - symmetrical excursion , unlabored           Heart/CV- RRR , no murmur , no gallop  , no rub, nl s1 s2                           - JVD- none , edema- none, stasis changes- none, varices- none  Lung- clear to P&A, wheeze- none, cough- none , dullness-none, rub- none           Chest wall-  Abd-  Br/ Gen/ Rectal- Not done, not indicated Extrem- cyanosis- none, clubbing, none, atrophy- none, strength- nl Neuro- grossly intact to observation

## 2017-06-18 ENCOUNTER — Encounter: Payer: Self-pay | Admitting: Internal Medicine

## 2017-08-12 MED FILL — AMLODIPINE BESYLATE 10 MG T: 10 | 30 days supply | Qty: 30 | Fill #1

## 2017-08-12 MED FILL — ATORVASTATIN 20 MG TABLET: 20 | 30 days supply | Qty: 30 | Fill #1

## 2017-08-16 DIAGNOSIS — G4733 Obstructive sleep apnea (adult) (pediatric): Secondary | ICD-10-CM | POA: Diagnosis not present

## 2017-08-25 NOTE — Assessment & Plan Note (Signed)
We emphasized the importance of normalizing body weight to improve management of medical problems including OSA consider bariatric referral.

## 2017-08-25 NOTE — Assessment & Plan Note (Signed)
Education done reviewing medical concerns of OSA, responsibility to drive safely, basic sleep hygiene, treatment options. Plan-begin CPAP auto 5-20

## 2017-08-30 MED FILL — busPIRone HCL 10 MG TABS: 10 | 30 days supply | Qty: 90 | Fill #1

## 2017-09-10 MED FILL — AMLODIPINE BESYLATE 10 MG T: 10 | 30 days supply | Qty: 30 | Fill #2

## 2017-09-10 MED FILL — ATORVASTATIN 20 MG TABLET: 20 | 30 days supply | Qty: 30 | Fill #2

## 2017-09-10 MED FILL — LOSARTAN POTASSIUM 100 MG T: 100 | 30 days supply | Qty: 30 | Fill #1

## 2017-09-10 MED FILL — ESCITALOPRAM 20 MG TABLET: 20 | 30 days supply | Qty: 30 | Fill #1

## 2017-10-08 MED FILL — ATORVASTATIN 20 MG TABLET: 20 | 30 days supply | Qty: 30 | Fill #3

## 2017-10-08 MED FILL — LOSARTAN POTASSIUM 100 MG T: 100 | 30 days supply | Qty: 30 | Fill #2

## 2017-10-08 MED FILL — ESCITALOPRAM 20 MG TABLET: 20 | 30 days supply | Qty: 30 | Fill #2

## 2017-10-08 MED FILL — AMLODIPINE BESYLATE 10 MG T: 10 | 30 days supply | Qty: 30 | Fill #3

## 2017-10-10 ENCOUNTER — Encounter: Payer: Self-pay | Admitting: Internal Medicine

## 2017-10-10 ENCOUNTER — Ambulatory Visit (INDEPENDENT_AMBULATORY_CARE_PROVIDER_SITE_OTHER): Payer: 59 | Admitting: Internal Medicine

## 2017-10-10 VITALS — BP 134/88 | HR 72 | Ht 71.0 in | Wt 374.2 lb

## 2017-10-10 DIAGNOSIS — J302 Other seasonal allergic rhinitis: Secondary | ICD-10-CM | POA: Diagnosis not present

## 2017-10-10 DIAGNOSIS — G4733 Obstructive sleep apnea (adult) (pediatric): Secondary | ICD-10-CM | POA: Diagnosis not present

## 2017-10-10 DIAGNOSIS — J3089 Other allergic rhinitis: Secondary | ICD-10-CM | POA: Diagnosis not present

## 2017-10-10 NOTE — Progress Notes (Signed)
03/07/17-42 year old male never smoker for sleep consultation.Patient's wife states that he snores very loudly she sleeps in the other room  He is not aware of excessive daytime sleepiness but has been told of witnessed apnea events. No movement disorder parasomnia and sleep. Both parents use CPAP. Bedtime around 10 or 11 PM with short sleep latency, up at 5:30 AM. Weight up 30 pounds in recent years. Broken nose when [place] [facility] but breathes through with his mouth closed comfortably. Denies heart or lung problems. Epworth sleepiness score 5/24  06/17/17- 42 year old male never smoker followed for OSA, complicated by morbid obesity Unattended Home Sleep Test 04/01/17-AHI 12.8/hour, desaturation to 80%, body weight 374 pounds Sleep study done 04/01/17 and is here today for a follow-up visit after sleep study. Education and discussion of treatment options done.  Questions answered. We will start CPAP auto 5-20.  10/10/17- 42 year old male never smoker followed for OSA, complicated by morbid obesity ---3 month follow up, acute symptoms Weight today 374 pounds CPAP auto 5-20/[facility] has stopped his snoring and he is beginning to sleep better but reports trouble adjusting to full facemask. Download 60% compliance, AHI 1.0/hour.  He is wearing CPAP most nights but missing four hour goal because of mask.  ROS-see HPI   + = positive Constitutional:    weight loss, night sweats, fevers, chills, fatigue, lassitude. HEENT:    headaches, difficulty swallowing, tooth/dental problems, sore throat,       sneezing, itching, ear ache, + nasal congestion, post nasal drip, snoring CV:    chest pain, orthopnea, PND, swelling in lower extremities, anasarca,                                                      dizziness, palpitations Resp:   shortness of breath with exertion or at rest.                productive cough,   non-productive cough, coughing up of blood.              change in color of mucus.  wheezing.    Skin:    rash or lesions. GI:  No-   heartburn, indigestion, abdominal pain, nausea, vomiting, diarrhea,                 change in bowel habits, loss of appetite GU: dysuria, change in color of urine, no urgency or frequency.   flank pain. MS:   joint pain, stiffness, decreased range of motion, back pain. Neuro-     nothing unusual Psych:  change in mood or affect.  depression or anxiety.   memory loss.  OBJ- Physical Exam General- Alert, Oriented, Affect-appropriate, Distress- none acute + morbidly obese/big man Skin- rash-none, lesions- none, excoriation- none Lymphadenopathy- none Head- atraumatic            Eyes- Gross vision intact, PERRLA, conjunctivae and secretions clear            Ears- Hearing, canals-normal            Nose-+ mild nasal congestion, no-Septal dev, mucus, polyps, erosion, perforation             Throat- Mallampati III-IV , mucosa clear , drainage- none, tonsils- atrophic Neck- flexible , trachea midline, no stridor , thyroid nl, carotid no bruit Chest - symmetrical excursion , unlabored  Heart/CV- RRR , no murmur , no gallop  , no rub, nl s1 s2                           - JVD- none , edema- none, stasis changes- none, varices- none           Lung- clear to P&A, wheeze- none, cough- none , dullness-none, rub- none           Chest wall-  Abd-  Br/ Gen/ Rectal- Not done, not indicated Extrem- cyanosis- none, clubbing, none, atrophy- none, strength- nl Neuro- grossly intact to observation

## 2017-10-10 NOTE — Patient Instructions (Signed)
We can continue CPAP auto 5-20 , mask of choice, humidifier, supplies, AirView     Dx OSA  Please call if we can help 

## 2017-10-18 ENCOUNTER — Encounter: Payer: Self-pay | Admitting: Internal Medicine

## 2017-11-07 MED FILL — ESCITALOPRAM 20 MG TABLET: 20 | 30 days supply | Qty: 30 | Fill #3

## 2017-11-07 MED FILL — ATORVASTATIN 20 MG TABLET: 20 | 30 days supply | Qty: 30 | Fill #4

## 2017-11-07 MED FILL — busPIRone HCL 10 MG TABS: 10 | 30 days supply | Qty: 90 | Fill #2

## 2017-11-07 MED FILL — LOSARTAN POTASSIUM 100 MG T: 100 | 30 days supply | Qty: 30 | Fill #3

## 2017-11-07 MED FILL — AMLODIPINE BESYLATE 10 MG T: 10 | 30 days supply | Qty: 30 | Fill #4

## 2017-11-14 ENCOUNTER — Telehealth: Payer: Self-pay | Admitting: Internal Medicine

## 2017-11-14 ENCOUNTER — Encounter: Payer: Self-pay | Admitting: Internal Medicine

## 2017-11-14 DIAGNOSIS — J3089 Other allergic rhinitis: Secondary | ICD-10-CM

## 2017-11-14 DIAGNOSIS — J302 Other seasonal allergic rhinitis: Secondary | ICD-10-CM | POA: Insufficient documentation

## 2017-11-14 NOTE — Assessment & Plan Note (Signed)
Fair start with CPAP.  He benefits-recognizes he sleeps better using it until mask discomfort causes him to take it off.  We reviewed compliance goals again and he thinks he can meet goals.  It does stop his snoring. Plan-continue auto 5-20.  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

## 2017-11-14 NOTE — Telephone Encounter (Signed)
Done

## 2017-11-14 NOTE — Assessment & Plan Note (Signed)
Emphasized use of OTC treatments-Flonase, Claritin

## 2017-11-14 NOTE — Assessment & Plan Note (Signed)
Still needs to emphasize lifestyle change for weight loss.  Consider bariatric referral.

## 2017-11-14 NOTE — Telephone Encounter (Signed)
OV note addended.  Pt aware.    Left detailed message [facility] Thereasa DistanceRodney [facility] to make aware of addended OV note.  Nothing further needed.

## 2017-11-14 NOTE — Telephone Encounter (Signed)
Spoke with pt, states that [facility] add[facility] from 10/10/17 to document that he is benefiting from cpap use, and if this is not addended that he will be responsible to pay out of pocket for his machine.    lmtcb [facility]Jason [facility] to verify that this is correct.    CY please advise if note can be addended to document use and benefit of cpap.  Pt states he will have to return machine if he is responsible to pay out of pocket for this.  Thanks!

## 2017-11-15 ENCOUNTER — Telehealth: Payer: Self-pay | Admitting: Internal Medicine

## 2017-11-15 NOTE — Telephone Encounter (Signed)
Called pt and scheduled and appt for him to see TP Mon. 11/18/17 at 3:30 for compliance with CPAP.  Nothing further needed at this current time.

## 2017-11-18 ENCOUNTER — Encounter: Payer: Self-pay | Admitting: Adult Health

## 2017-11-18 ENCOUNTER — Ambulatory Visit (INDEPENDENT_AMBULATORY_CARE_PROVIDER_SITE_OTHER): Payer: 59 | Admitting: Adult Health

## 2017-11-18 DIAGNOSIS — G4733 Obstructive sleep apnea (adult) (pediatric): Secondary | ICD-10-CM | POA: Diagnosis not present

## 2017-11-18 NOTE — Progress Notes (Signed)
 @Patient  ID: Kyle AdlerRicky Levy, male    DOB: 11/25/1975, 42 y.o.   MRN: [id number]  Chief Complaint  Patient presents with  . Follow-up    OSA    Referring provider: Corwin LevinsJohn, James W, MD  HPI: 42 yo male followed for OSA   TEST  Unattended Home Sleep Test 04/01/17-AHI 12.8/hour, desaturation to 80%, body weight 374 pounds  11/18/2017 Follow up ; OSA  Pt returns for follow-up of sleep apnea.  Patient is on CPAP at bedtime.  Says he has been having trouble with his mask at times.  Feels like that there is either too hot or too much pressure at times.  To wear it every night but feels like after 4 hours he needs to take it off.  Download shows excellent compliance with 97% usage.  Average usage at 4 hours.  She is on auto CPAP 5-20 cm AHI 1.0.  Minimal leaks.    No Known Allergies  Immunization History  Administered Date(s) Administered  . Tdap 10/06/2013    Past Medical History:  Diagnosis Date  . Anxiety 08/27/2011  . HTN (hypertension) 08/27/2011  . Hyperlipidemia 08/27/2011  . Hypertension   . Morbid obesity (HCC) 09/14/2015  . Psoriasis 09/14/2015    Tobacco History: Social History   Tobacco Use  Smoking Status Never Smoker  Smokeless Tobacco Never Used   Counseling given: Not Answered   Outpatient Encounter Medications as of 11/18/2017  Medication Sig  . amLODipine (NORVASC) 10 MG tablet Take 1 tablet (10 mg total) by mouth daily.  Marland Kitchen. atorvastatin (LIPITOR) 20 MG tablet Take 1 tablet (20 mg total) by mouth daily.  . busPIRone (BUSPAR) 10 MG tablet Take 1 tablet (10 mg total) by mouth 3 (three) times daily as needed.  Marland Kitchen. escitalopram (LEXAPRO) 20 MG tablet Take 1 tablet (20 mg total) by mouth daily.  Marland Kitchen. losartan (COZAAR) 100 MG tablet Take 1 tablet (100 mg total) by mouth daily.  . Multiple Vitamin (MULTIVITAMIN) tablet Take 1 tablet by mouth daily.    . Omega-3 Fatty Acids (FISH OIL PO) Take by mouth daily.   No facility-administered encounter medications on file as of  11/18/2017.      Review of Systems  Constitutional:   No  weight loss, night sweats,  Fevers, chills, fatigue, or  lassitude.  HEENT:   No headaches,  Difficulty swallowing,  Tooth/dental problems, or  Sore throat,                No sneezing, itching, ear ache, nasal congestion, post nasal drip,   CV:  No chest pain,  Orthopnea, PND, swelling in lower extremities, anasarca, dizziness, palpitations, syncope.   GI  No heartburn, indigestion, abdominal pain, nausea, vomiting, diarrhea, change in bowel habits, loss of appetite, bloody stools.   Resp: No shortness of breath with exertion or at rest.  No excess mucus, no productive cough,  No non-productive cough,  No coughing up of blood.  No change in color of mucus.  No wheezing.  No chest wall deformity  Skin: no rash or lesions.  GU: no dysuria, change in color of urine, no urgency or frequency.  No flank pain, no hematuria   MS:  No joint pain or swelling.  No decreased range of motion.  No back pain.    Physical Exam  BP 140/80 (BP Location: Left Arm, Cuff Size: Large)   Pulse 75   Ht 5\' 11"  (1.803 m)   Wt (!) 367 lb 12.8 oz (166.8  kg)   SpO2 95%   BMI 51.30 kg/m   GEN: A/Ox3; pleasant , NAD morbidly obese   HEENT:  [place]/AT,  EACs-clear, TMs-wnl, NOSE-clear, THROAT-clear, no lesions, no postnasal drip or exudate noted.  Class III airway MP airway   NECK:  Supple w/ fair ROM; no JVD; normal carotid impulses w/o bruits; no thyromegaly or nodules palpated; no lymphadenopathy.    RESP  Clear  P & A; w/o, wheezes/ rales/ or rhonchi. no accessory muscle use, no dullness to percussion  CARD:  RRR, no m/r/g, no peripheral edema, pulses intact, no cyanosis or clubbing.  GI:   Soft & nt; nml bowel sounds; no organomegaly or masses detected.   Musco: Warm bil, no deformities or joint swelling noted.   Neuro: alert, no focal deficits noted.    Skin: Warm, no lesions or rashes    Lab Results:  CBC    Component Value  Date/Time   WBC 6.1 05/06/2017 0745   RBC 5.01 05/06/2017 0745   HGB 14.6 05/06/2017 0745   HCT 42.9 05/06/2017 0745   PLT 310.0 05/06/2017 0745   MCV 85.7 05/06/2017 0745   MCHC 34.1 05/06/2017 0745   RDW 13.5 05/06/2017 0745   LYMPHSABS 1.5 05/06/2017 0745   MONOABS 0.4 05/06/2017 0745   EOSABS 0.2 05/06/2017 0745   BASOSABS 0.1 05/06/2017 0745    BMET    Component Value Date/Time   NA 138 05/06/2017 0745   K 4.5 05/06/2017 0745   CL 101 05/06/2017 0745   CO2 30 05/06/2017 0745   GLUCOSE 89 05/06/2017 0745   BUN 13 05/06/2017 0745   CREATININE 0.95 05/06/2017 0745   CALCIUM 9.5 05/06/2017 0745    BNP No results found for: BNP  ProBNP No results found for: PROBNP  Imaging: No results found.   Assessment & Plan:   No problem-specific Assessment & Plan notes found for this encounter.     Rubye Oaks, NP 11/18/2017

## 2017-11-18 NOTE — Assessment & Plan Note (Signed)
Adjust CPAP pressure for comfort. Will change to a full face dream where mask.  Plan  Patient Instructions  Continue on CPAP Try to wear for each night 4-6 hours.   Work on healthy weight Follow-up in 2-3 months with CPAP download with Dr. Maple HudsonYoung  .

## 2017-11-18 NOTE — Patient Instructions (Signed)
Continue on CPAP Try to wear for each night 4-6 hours.   Work on healthy weight Follow-up in 2-3 months with CPAP download with Dr. Maple HudsonYoung  .

## 2017-11-18 NOTE — Addendum Note (Signed)
Addended by: Boone MasterJONES, JESSICA E on: 11/18/2017 05:31 PM   Modules accepted: Orders

## 2017-12-09 MED FILL — ESCITALOPRAM 20 MG TABLET: 20 | 30 days supply | Qty: 30 | Fill #4

## 2017-12-09 MED FILL — ATORVASTATIN 20 MG TABLET: 20 | 30 days supply | Qty: 30 | Fill #5

## 2017-12-09 MED FILL — AMLODIPINE BESYLATE 10 MG T: 10 | 30 days supply | Qty: 30 | Fill #5

## 2017-12-09 MED FILL — LOSARTAN POTASSIUM 100 MG T: 100 | 30 days supply | Qty: 30 | Fill #4

## 2018-01-09 MED FILL — ATORVASTATIN 20 MG TABLET: 20 | 30 days supply | Qty: 30 | Fill #6

## 2018-01-09 MED FILL — LOSARTAN POTASSIUM 100 MG T: 100 | 30 days supply | Qty: 30 | Fill #5

## 2018-01-09 MED FILL — AMLODIPINE BESYLATE 10 MG T: 10 | 30 days supply | Qty: 30 | Fill #6

## 2018-01-09 MED FILL — ESCITALOPRAM 20 MG TABLET: 20 | 30 days supply | Qty: 30 | Fill #5

## 2018-02-10 MED FILL — ATORVASTATIN 20 MG TABLET: 20 | 30 days supply | Qty: 30 | Fill #7

## 2018-02-10 MED FILL — AMLODIPINE BESYLATE 10 MG T: 10 | 30 days supply | Qty: 30 | Fill #7

## 2018-02-10 MED FILL — LOSARTAN POTASSIUM 100 MG T: 100 | 30 days supply | Qty: 30 | Fill #6

## 2018-02-10 MED FILL — ESCITALOPRAM 20 MG TABLET: 20 | 30 days supply | Qty: 30 | Fill #6

## 2018-03-10 MED FILL — ESCITALOPRAM 20 MG TABLET: 20 | 30 days supply | Qty: 30 | Fill #7

## 2018-03-10 MED FILL — ATORVASTATIN 20 MG TABLET: 20 | 30 days supply | Qty: 30 | Fill #8

## 2018-03-10 MED FILL — LOSARTAN POTASSIUM 100 MG T: 100 | 30 days supply | Qty: 30 | Fill #7

## 2018-03-10 MED FILL — AMLODIPINE BESYLATE 10 MG T: 10 | 30 days supply | Qty: 30 | Fill #8

## 2018-03-11 MED FILL — busPIRone HCL 10 MG TABS: 10 | 30 days supply | Qty: 90 | Fill #3

## 2018-04-09 ENCOUNTER — Ambulatory Visit (INDEPENDENT_AMBULATORY_CARE_PROVIDER_SITE_OTHER): Payer: 59 | Admitting: Acute Care

## 2018-04-09 ENCOUNTER — Encounter: Payer: Self-pay | Admitting: Acute Care

## 2018-04-09 DIAGNOSIS — G4733 Obstructive sleep apnea (adult) (pediatric): Secondary | ICD-10-CM

## 2018-04-09 MED FILL — LOSARTAN POTASSIUM 100 MG T: 100 | 30 days supply | Qty: 30 | Fill #8

## 2018-04-09 MED FILL — AMLODIPINE BESYLATE 10 MG T: 10 | 30 days supply | Qty: 30 | Fill #9

## 2018-04-09 MED FILL — ATORVASTATIN CALCIUM 20 MG: 20 | 30 days supply | Qty: 30 | Fill #9

## 2018-04-09 MED FILL — ESCITALOPRAM 20 MG TABLET: 20 | 30 days supply | Qty: 30 | Fill #8

## 2018-04-09 NOTE — Assessment & Plan Note (Signed)
Good compliance AHI is 0.76 Plan: Continue on CPAP at bedtime. You appear to be benefiting from the treatment Goal is to wear for at least 6 hours each night for maximal clinical benefit. Continue to work on weight loss, as the link between excess weight  and sleep apnea is well established.  Do not drive if sleepy. Remember to clean mask, tubing, filter, and reservoir once weekly with soapy water.  Follow up with  Dr. Maple HudsonYoung  In  6 months  or before as needed.   Please notify [facility] that the patient is  back on his full mask and needs this mask when he is re-supplied.

## 2018-04-09 NOTE — Patient Instructions (Signed)
It is good to see you today Continue on CPAP at bedtime. You appear to be benefiting from the treatment Goal is to wear for at least 6 hours each night for maximal clinical benefit. Continue to work on weight loss, as the link between excess weight  and sleep apnea is well established.  Do not drive if sleepy. Remember to clean mask, tubing, filter, and reservoir once weekly with soapy water.  Follow up with  Dr. Maple HudsonYoung  In  6 months  or before as needed.   Please notify [facility] that the patient is  back on his full mask and needs this mask when he is re-supplied.

## 2018-04-09 NOTE — Progress Notes (Signed)
History of Present Illness Kyle Levy is a 42 y.o. male with OSA on CPAP therapy. He is followed by Dr. Maple HudsonYoung.   04/09/2018  6 month Follow up for CPAP therapy. Pt. Has been doing well on CPAP. Down load endorses compliance. He has no daytime sleepiness. He feels rested every day.  He has no issues with his equipment. He is using the full face mask which he prefers. He denies any issues with his equipment.AHI is well controlled on current settings.  Test Results: Down Load: 03/09/2018-04/08/2018 Auto CPAP 5 cm-20 cm of H2O Days Used 27/30 > 6 hours 9/30 > 4 hours 27/30 Average usage 5 hours 2 minutes AHI is 0.76   CBC Latest Ref Rng & Units 05/06/2017 04/10/2016 09/05/2015  WBC 4.0 - 10.5 K/uL 6.1 8.5 8.0  Hemoglobin 13.0 - 17.0 g/dL 96.014.6 45.414.9 09.815.2  Hematocrit 39.0 - 52.0 % 42.9 43.7 46.4  Platelets 150.0 - 400.0 K/uL 310.0 323.0 329.0    BMP Latest Ref Rng & Units 05/06/2017 04/10/2016 09/05/2015  Glucose 70 - 99 mg/dL 89 86 93  BUN 6 - 23 mg/dL 13 16 15   Creatinine 0.40 - 1.50 mg/dL 1.190.95 1.470.95 8.291.04  Sodium 135 - 145 mEq/L 138 137 137  Potassium 3.5 - 5.1 mEq/L 4.5 4.2 4.0  Chloride 96 - 112 mEq/L 101 101 101  CO2 19 - 32 mEq/L 30 29 28   Calcium 8.4 - 10.5 mg/dL 9.5 9.6 9.8    BNP No results found for: BNP  ProBNP No results found for: PROBNP  PFT No results found for: FEV1PRE, FEV1POST, FVCPRE, FVCPOST, TLC, DLCOUNC, PREFEV1FVCRT, PSTFEV1FVCRT  No results found.   Past medical hx Past Medical History:  Diagnosis Date  . Anxiety 08/27/2011  . HTN (hypertension) 08/27/2011  . Hyperlipidemia 08/27/2011  . Hypertension   . Morbid obesity (HCC) 09/14/2015  . Psoriasis 09/14/2015     Social History   Tobacco Use  . Smoking status: Never Smoker  . Smokeless tobacco: Never Used  Substance Use Topics  . Alcohol use: Yes    Comment: social  . Drug use: No    [name] reports that he has never smoked. He has never used smokeless tobacco. He reports that he  drinks alcohol. He reports that he does not use drugs.  Tobacco Cessation: Never smoker  Past surgical hx, Family hx, Social hx all reviewed.  Current Outpatient Medications on File Prior to Visit  Medication Sig  . amLODipine (NORVASC) 10 MG tablet Take 1 tablet (10 mg total) by mouth daily.  Marland Kitchen. atorvastatin (LIPITOR) 20 MG tablet Take 1 tablet (20 mg total) by mouth daily.  . busPIRone (BUSPAR) 10 MG tablet Take 1 tablet (10 mg total) by mouth 3 (three) times daily as needed.  Marland Kitchen. escitalopram (LEXAPRO) 20 MG tablet Take 1 tablet (20 mg total) by mouth daily.  Marland Kitchen. losartan (COZAAR) 100 MG tablet Take 1 tablet (100 mg total) by mouth daily.  . Multiple Vitamin (MULTIVITAMIN) tablet Take 1 tablet by mouth daily.    . Omega-3 Fatty Acids (FISH OIL PO) Take by mouth daily.   No current facility-administered medications on file prior to visit.      No Known Allergies  Review Of Systems:  Constitutional:   No  weight loss, night sweats,  Fevers, chills, fatigue, or  lassitude.  HEENT:   No headaches,  Difficulty swallowing,  Tooth/dental problems, or  Sore throat,  No sneezing, itching, ear ache, nasal congestion, post nasal drip,   CV:  No chest pain,  Orthopnea, PND, swelling in lower extremities, anasarca, dizziness, palpitations, syncope.   GI  No heartburn, indigestion, abdominal pain, nausea, vomiting, diarrhea, change in bowel habits, loss of appetite, bloody stools.   Resp: No shortness of breath with exertion or at rest.  No excess mucus, no productive cough,  No non-productive cough,  No coughing up of blood.  No change in color of mucus.  No wheezing.  No chest wall deformity  Skin: no rash or lesions.  GU: no dysuria, change in color of urine, no urgency or frequency.  No flank pain, no hematuria   MS:  No joint pain or swelling.  No decreased range of motion.  No back pain.  Psych:  No change in mood or affect. No depression or anxiety.  No memory  loss.   Vital Signs BP 132/82, HR 65, Saturation 95% on RA, Body mass index is 53.56 kg/m.  Physical Exam:  General- No distress,  A&Ox3, pleasant ENT: No sinus tenderness, TM clear, pale nasal mucosa, no oral exudate,no post nasal drip, no LAN Cardiac: S1, S2, regular rate and rhythm, no murmur Chest: No wheeze/ rales/ dullness; no accessory muscle use, no nasal flaring, no sternal retractions Abd.: Soft Non-tender, ND, Body mass index is 53.56 kg/m. Ext: No clubbing cyanosis, edema Neuro:  normal strength, MAE x 4, A&O x 3 Skin: No rashes, warm and dry Psych: normal mood and behavior   Assessment/Plan  Obstructive sleep apnea Good compliance AHI is 0.76 Plan: Continue on CPAP at bedtime. You appear to be benefiting from the treatment Goal is to wear for at least 6 hours each night for maximal clinical benefit. Continue to work on weight loss, as the link between excess weight  and sleep apnea is well established.  Do not drive if sleepy. Remember to clean mask, tubing, filter, and reservoir once weekly with soapy water.  Follow up with  Dr. Maple Hudson  In  6 months  or before as needed.   Please notify [facility] that the patient is  back on his full mask and needs this mask when he is re-supplied.     Bevelyn Ngo, NP 04/09/2018  6:37 PM

## 2018-04-10 ENCOUNTER — Ambulatory Visit: Payer: 59 | Admitting: Internal Medicine

## 2018-05-07 ENCOUNTER — Encounter: Payer: 59 | Admitting: Internal Medicine

## 2018-05-09 ENCOUNTER — Other Ambulatory Visit: Payer: Self-pay | Admitting: Internal Medicine

## 2018-05-09 MED FILL — LOSARTAN POTASSIUM 100 MG T: 100 | 30 days supply | Qty: 30 | Fill #0

## 2018-05-09 MED FILL — ATORVASTATIN CALCIUM 20 MG: 20 | 30 days supply | Qty: 30 | Fill #0

## 2018-05-09 MED FILL — ESCITALOPRAM 20 MG TABLET: 20 | 30 days supply | Qty: 30 | Fill #0

## 2018-05-09 MED FILL — AMLODIPINE BESYLATE 10 MG T: 10 | 30 days supply | Qty: 30 | Fill #0

## 2018-05-21 MED FILL — busPIRone HCL 10 MG TABS: 10 | 30 days supply | Qty: 90 | Fill #4

## 2018-05-28 ENCOUNTER — Ambulatory Visit (INDEPENDENT_AMBULATORY_CARE_PROVIDER_SITE_OTHER): Payer: 59 | Admitting: Internal Medicine

## 2018-05-28 ENCOUNTER — Encounter: Payer: Self-pay | Admitting: Internal Medicine

## 2018-05-28 ENCOUNTER — Other Ambulatory Visit (INDEPENDENT_AMBULATORY_CARE_PROVIDER_SITE_OTHER): Payer: 59

## 2018-05-28 VITALS — BP 128/84 | HR 67 | Temp 98.3°F | Ht 71.0 in | Wt 377.0 lb

## 2018-05-28 DIAGNOSIS — Z0000 Encounter for general adult medical examination without abnormal findings: Secondary | ICD-10-CM

## 2018-05-28 DIAGNOSIS — Z23 Encounter for immunization: Secondary | ICD-10-CM

## 2018-05-28 LAB — CBC WITH DIFFERENTIAL/PLATELET
BASOS ABS: 0.1 10*3/uL (ref 0.0–0.1)
Basophils Relative: 1.5 % (ref 0.0–3.0)
Eosinophils Absolute: 0.1 10*3/uL (ref 0.0–0.7)
Eosinophils Relative: 1.6 % (ref 0.0–5.0)
HCT: 40.7 % (ref 39.0–52.0)
HEMOGLOBIN: 13.8 g/dL (ref 13.0–17.0)
Lymphocytes Relative: 21.8 % (ref 12.0–46.0)
Lymphs Abs: 1.8 10*3/uL (ref 0.7–4.0)
MCHC: 33.9 g/dL (ref 30.0–36.0)
MCV: 83.7 fl (ref 78.0–100.0)
MONO ABS: 0.6 10*3/uL (ref 0.1–1.0)
MONOS PCT: 6.7 % (ref 3.0–12.0)
Neutro Abs: 5.7 10*3/uL (ref 1.4–7.7)
Neutrophils Relative %: 68.4 % (ref 43.0–77.0)
Platelets: 323 10*3/uL (ref 150.0–400.0)
RBC: 4.86 Mil/uL (ref 4.22–5.81)
RDW: 13.6 % (ref 11.5–15.5)
WBC: 8.3 10*3/uL (ref 4.0–10.5)

## 2018-05-28 LAB — LIPID PANEL
CHOL/HDL RATIO: 4
Cholesterol: 137 mg/dL (ref 0–200)
HDL: 32.9 mg/dL — ABNORMAL LOW (ref >39.00)
LDL CALC: 73 mg/dL (ref 0–99)
NONHDL: 104.24
Triglycerides: 157 mg/dL — ABNORMAL HIGH (ref 0.0–149.0)
VLDL: 31.4 mg/dL (ref 0.0–40.0)

## 2018-05-28 LAB — HEPATIC FUNCTION PANEL
ALT: 20 U/L (ref 0–53)
AST: 18 U/L (ref 0–37)
Albumin: 4.4 g/dL (ref 3.5–5.2)
Alkaline Phosphatase: 79 U/L (ref 39–117)
BILIRUBIN DIRECT: 0.1 mg/dL (ref 0.0–0.3)
BILIRUBIN TOTAL: 0.5 mg/dL (ref 0.2–1.2)
Total Protein: 7.6 g/dL (ref 6.0–8.3)

## 2018-05-28 LAB — TSH: TSH: 2.02 u[IU]/mL (ref 0.35–4.50)

## 2018-05-28 LAB — URINALYSIS, ROUTINE W REFLEX MICROSCOPIC
Bilirubin Urine: NEGATIVE
HGB URINE DIPSTICK: NEGATIVE
KETONES UR: NEGATIVE
Leukocytes, UA: NEGATIVE
NITRITE: NEGATIVE
RBC / HPF: NONE SEEN (ref 0–?)
Specific Gravity, Urine: 1.01 (ref 1.000–1.030)
Total Protein, Urine: NEGATIVE
URINE GLUCOSE: NEGATIVE
UROBILINOGEN UA: 0.2 (ref 0.0–1.0)
pH: 6.5 (ref 5.0–8.0)

## 2018-05-28 LAB — PSA: PSA: 0.58 ng/mL (ref 0.10–4.00)

## 2018-05-28 LAB — BASIC METABOLIC PANEL
BUN: 16 mg/dL (ref 6–23)
CHLORIDE: 101 meq/L (ref 96–112)
CO2: 30 meq/L (ref 19–32)
Calcium: 9.2 mg/dL (ref 8.4–10.5)
Creatinine, Ser: 0.92 mg/dL (ref 0.40–1.50)
GFR: 96.38 mL/min (ref >60.00)
GLUCOSE: 90 mg/dL (ref 70–99)
Potassium: 3.8 mEq/L (ref 3.5–5.1)
Sodium: 137 mEq/L (ref 135–145)

## 2018-05-28 NOTE — Assessment & Plan Note (Signed)

## 2018-05-28 NOTE — Progress Notes (Signed)
Subjective:    Patient ID: Kyle Levy, male    DOB: 08-14-1976, 42 y.o.   MRN: [id number]  HPI  Here for wellness and f/u;  Overall doing ok;  Pt denies Chest pain, worsening SOB, DOE, wheezing, orthopnea, PND, worsening LE edema, palpitations, dizziness or syncope.  Pt denies neurological change such as new headache, facial or extremity weakness.  Pt denies polydipsia, polyuria, or low sugar symptoms. Pt states overall good compliance with treatment and medications, good tolerability, and has been trying to follow appropriate diet.  Pt denies worsening depressive symptoms, suicidal ideation or panic. No fever, night sweats, wt loss, loss of appetite, or other constitutional symptoms.  Pt states good ability with ADL's, has low fall risk, home safety reviewed and adequate, no other significant changes in hearing or vision, and only occasionally active with exercise.  No new complaints Past Medical History:  Diagnosis Date  . Anxiety 08/27/2011  . HTN (hypertension) 08/27/2011  . Hyperlipidemia 08/27/2011  . Hypertension   . Morbid obesity (HCC) 09/14/2015  . Psoriasis 09/14/2015   Past Surgical History:  Procedure Laterality Date  . KNEE ARTHROSCOPY     bilateral knee    reports that he has never smoked. He has never used smokeless tobacco. He reports that he drinks alcohol. He reports that he does not use drugs. family history includes Heart disease in his father; Hypertension in his father; Stroke in his father. No Known Allergies Current Outpatient Medications on File Prior to Visit  Medication Sig Dispense Refill  . amLODipine (NORVASC) 10 MG tablet Take 1 tablet (10 mg total) by mouth daily. Must keep Sept 4th appt for future refills 30 tablet 0  . atorvastatin (LIPITOR) 20 MG tablet Take 1 tablet (20 mg total) by mouth daily. Must keep Sept 4th appt for future refills 30 tablet 0  . busPIRone (BUSPAR) 10 MG tablet Take 1 tablet (10 mg total) by mouth 3 (three) times daily as  needed. 90 tablet 5  . escitalopram (LEXAPRO) 20 MG tablet Take 1 tablet (20 mg total) by mouth daily. Must keep Sept 4th appt for future refills 30 tablet 0  . losartan (COZAAR) 100 MG tablet Take 1 tablet (100 mg total) by mouth daily. Must keep Sept 4th appt for future refills 30 tablet 0  . Multiple Vitamin (MULTIVITAMIN) tablet Take 1 tablet by mouth daily.      . Omega-3 Fatty Acids (FISH OIL PO) Take by mouth daily.     No current facility-administered medications on file prior to visit.    Review of Systems Constitutional: Negative for other unusual diaphoresis, sweats, appetite or weight changes HENT: Negative for other worsening hearing loss, ear pain, facial swelling, mouth sores or neck stiffness.   Eyes: Negative for other worsening pain, redness or other visual disturbance.  Respiratory: Negative for other stridor or swelling Cardiovascular: Negative for other palpitations or other chest pain  Gastrointestinal: Negative for worsening diarrhea or loose stools, blood in stool, distention or other pain Genitourinary: Negative for hematuria, flank pain or other change in urine volume.  Musculoskeletal: Negative for myalgias or other joint swelling.  Skin: Negative for other color change, or other wound or worsening drainage.  Neurological: Negative for other syncope or numbness. Hematological: Negative for other adenopathy or swelling Psychiatric/Behavioral: Negative for hallucinations, other worsening agitation, SI, self-injury, or new decreased concentration All other system neg per pt    Objective:   Physical Exam BP 128/84   Pulse 67   Temp  98.3 F (36.8 C) (Oral)   Ht 5\' 11"  (1.803 m)   Wt (!) 377 lb (171 kg)   SpO2 96%   BMI 52.58 kg/m  VS noted,  Constitutional: Pt is oriented to person, place, and time. Appears well-developed and well-nourished, in no significant distress and comfortable Head: Normocephalic and atraumatic  Eyes: Conjunctivae and EOM are normal.  Pupils are equal, round, and reactive to light Right Ear: External ear normal without discharge Left Ear: External ear normal without discharge Nose: Nose without discharge or deformity Mouth/Throat: Oropharynx is without other ulcerations and moist  Neck: Normal range of motion. Neck supple. No JVD present. No tracheal deviation present or significant neck LA or mass Cardiovascular: Normal rate, regular rhythm, normal heart sounds and intact distal pulses.   Pulmonary/Chest: WOB normal and breath sounds without rales or wheezing  Abdominal: Soft. Bowel sounds are normal. NT. No HSM  Musculoskeletal: Normal range of motion. Exhibits no edema Lymphadenopathy: Has no other cervical adenopathy.  Neurological: Pt is alert and oriented to person, place, and time. Pt has normal reflexes. No cranial nerve deficit. Motor grossly intact, Gait intact Skin: Skin is warm and dry. No rash noted or new ulcerations Psychiatric:  Has normal mood and affect. Behavior is normal without agitation No other exam findings Lab Results  Component Value Date   WBC 8.3 05/28/2018   HGB 13.8 05/28/2018   HCT 40.7 05/28/2018   PLT 323.0 05/28/2018   GLUCOSE 90 05/28/2018   CHOL 137 05/28/2018   TRIG 157.0 (H) 05/28/2018   HDL 32.90 (L) 05/28/2018   LDLDIRECT 85.0 04/10/2016   LDLCALC 73 05/28/2018   ALT 20 05/28/2018   AST 18 05/28/2018   NA 137 05/28/2018   K 3.8 05/28/2018   CL 101 05/28/2018   CREATININE 0.92 05/28/2018   BUN 16 05/28/2018   CO2 30 05/28/2018   TSH 2.02 05/28/2018   PSA 0.58 05/28/2018       Assessment & Plan:

## 2018-05-28 NOTE — Patient Instructions (Signed)

## 2018-06-09 ENCOUNTER — Other Ambulatory Visit: Payer: Self-pay | Admitting: Internal Medicine

## 2018-06-09 MED FILL — ESCITALOPRAM 20 MG TABLET: 20 | 30 days supply | Qty: 30 | Fill #0

## 2018-06-09 MED FILL — ATORVASTATIN CALCIUM 20 MG: 20 | 30 days supply | Qty: 30 | Fill #0

## 2018-06-09 MED FILL — LOSARTAN POTASSIUM 100 MG T: 100 | 30 days supply | Qty: 30 | Fill #0

## 2018-06-09 MED FILL — AMLODIPINE BESYLATE 10 MG T: 10 | 30 days supply | Qty: 30 | Fill #0

## 2018-07-10 ENCOUNTER — Other Ambulatory Visit: Payer: Self-pay | Admitting: Internal Medicine

## 2018-07-10 MED FILL — LOSARTAN POTASSIUM 100 MG T: 100 | 30 days supply | Qty: 30 | Fill #0

## 2018-07-10 MED FILL — AMLODIPINE BESYLATE 10 MG T: 10 | 30 days supply | Qty: 30 | Fill #0

## 2018-07-10 MED FILL — ATORVASTATIN CALCIUM 20 MG: 20 | 30 days supply | Qty: 30 | Fill #0

## 2018-07-10 MED FILL — ESCITALOPRAM 20 MG TABLET: 20 | 30 days supply | Qty: 30 | Fill #0

## 2018-07-24 MED FILL — TRIAMCINOLONE 0.1% CREAM: 0.1 | 14 days supply | Qty: 454 | Fill #0

## 2018-08-08 ENCOUNTER — Other Ambulatory Visit: Payer: Self-pay | Admitting: Internal Medicine

## 2018-08-08 MED FILL — AMLODIPINE BESYLATE 10 MG T: 10 | 30 days supply | Qty: 30 | Fill #0

## 2018-08-08 MED FILL — ESCITALOPRAM 20 MG TABLET: 20 | 30 days supply | Qty: 30 | Fill #0

## 2018-08-08 MED FILL — LOSARTAN POTASSIUM 100 MG T: 100 | 30 days supply | Qty: 30 | Fill #0

## 2018-08-08 MED FILL — ATORVASTATIN CALCIUM 20 MG: 20 | 30 days supply | Qty: 30 | Fill #0

## 2018-08-11 ENCOUNTER — Encounter: Payer: Self-pay | Admitting: Internal Medicine

## 2018-08-12 ENCOUNTER — Other Ambulatory Visit: Payer: Self-pay

## 2018-08-12 MED ORDER — ATORVASTATIN CALCIUM 20 MG PO TABS: ORAL_TABLET | ORAL | 3 refills | Status: DC

## 2018-08-12 MED ORDER — AMLODIPINE BESYLATE 10 MG PO TABS: ORAL_TABLET | ORAL | 3 refills | Status: DC

## 2018-08-12 MED ORDER — LOSARTAN POTASSIUM 100 MG PO TABS: ORAL_TABLET | ORAL | 3 refills | Status: DC

## 2018-08-12 MED ORDER — BUSPIRONE HCL 10 MG PO TABS: 10.0000 mg | ORAL_TABLET | Freq: Three times a day (TID) | ORAL | 5 refills | Status: DC | PRN

## 2018-09-04 ENCOUNTER — Encounter: Payer: Self-pay | Admitting: Internal Medicine

## 2018-09-04 ENCOUNTER — Other Ambulatory Visit: Payer: Self-pay | Admitting: Internal Medicine

## 2018-09-04 MED ORDER — ESCITALOPRAM OXALATE 20 MG PO TABS: 20.0000 mg | ORAL_TABLET | Freq: Every day | ORAL | 2 refills | Status: DC

## 2018-09-04 NOTE — Telephone Encounter (Signed)
Reviewed chart pt is up-to-date sent refills to [facility].../lmb  

## 2018-10-09 ENCOUNTER — Encounter: Payer: Self-pay | Admitting: Internal Medicine

## 2018-10-09 ENCOUNTER — Ambulatory Visit (INDEPENDENT_AMBULATORY_CARE_PROVIDER_SITE_OTHER): Payer: BLUE CROSS/BLUE SHIELD | Admitting: Internal Medicine

## 2018-10-09 VITALS — BP 142/80 | HR 64 | Ht 71.0 in | Wt 380.2 lb

## 2018-10-09 DIAGNOSIS — G4733 Obstructive sleep apnea (adult) (pediatric): Secondary | ICD-10-CM

## 2018-10-09 NOTE — Patient Instructions (Signed)
We can continue CPAP 5-15, mask of choice, humidifier, supplies, Airview  Try to lose some weight this year- it will help  Please call if we can help

## 2018-10-09 NOTE — Progress Notes (Signed)
HPI male never smoker followed for OSA, complicated by morbid obesity Unattended Home Sleep Test 04/01/17-AHI 12.8/hour, desaturation to 80%, body weight 374 pounds  ----------------------------------------------------------------------  10/10/17- 43 year old male never smoker followed for OSA, complicated by morbid obesity ---3 month follow up, acute symptoms Weight today 374 pounds CPAP auto 5-20/[facility] CPAP has stopped his snoring and he is beginning to sleep better but reports trouble adjusting to full facemask. Download 60% compliance, AHI 1.0/hour.  He is wearing CPAP most nights but missing four hour goal because of mask.  10/09/2018- 43 year old male never smoker followed for OSA, complicated by morbid obesity ---3 month follow up, acute symptoms -----OSA; DME: [name] wears CPAP nightly and DL attached.  No new supplies needed.  CPAP auto 5-15/[facility] Weight today 380 pounds Download 87% compliance AHI 0.5/hour He feels he is doing well. Comfortable withj current settings.   ROS-see HPI   + = positive Constitutional:    weight loss, night sweats, fevers, chills, fatigue, lassitude. HEENT:    headaches, difficulty swallowing, tooth/dental problems, sore throat,       sneezing, itching, ear ache, + nasal congestion, post nasal drip, snoring CV:    chest pain, orthopnea, PND, swelling in lower extremities, anasarca,                                                     dizziness, palpitations Resp:   shortness of breath with exertion or at rest.                productive cough,   non-productive cough, coughing up of blood.              change in color of mucus.  wheezing.   Skin:    rash or lesions. GI:  No-   heartburn, indigestion, abdominal pain, nausea, vomiting, diarrhea,                 change in bowel habits, loss of appetite GU: dysuria, change in color of urine, no urgency or frequency.   flank pain. MS:   joint pain, stiffness, decreased range of motion, back  pain. Neuro-     nothing unusual Psych:  change in mood or affect.  depression or anxiety.   memory loss.  OBJ- Physical Exam General- Alert, Oriented, Affect-appropriate, Distress- none acute + morbidly obese/big man Skin- rash-none, lesions- none, excoriation- none Lymphadenopathy- none Head- atraumatic            Eyes- Gross vision intact, PERRLA, conjunctivae and secretions clear            Ears- Hearing, canals-normal            Nose-+ mild nasal congestion, no-Septal dev, mucus, polyps, erosion, perforation             Throat- Mallampati III-IV , mucosa clear , drainage- none, tonsils- atrophic Neck- flexible , trachea midline, no stridor , thyroid nl, carotid no bruit Chest - symmetrical excursion , unlabored           Heart/CV- RRR , no murmur , no gallop  , no rub, nl s1 s2                           - JVD- none , edema- none, stasis changes- none, varices- none  Lung- clear to P&A, wheeze- none, cough- none , dullness-none, rub- none           Chest wall-  Abd-  Br/ Gen/ Rectal- Not done, not indicated Extrem- cyanosis- none, clubbing, none, atrophy- none, strength- nl Neuro- grossly intact to observation

## 2018-10-10 ENCOUNTER — Ambulatory Visit: Payer: 59 | Admitting: Internal Medicine

## 2018-10-30 ENCOUNTER — Encounter (HOSPITAL_BASED_OUTPATIENT_CLINIC_OR_DEPARTMENT_OTHER): Payer: Self-pay | Admitting: Emergency Medicine

## 2018-10-30 ENCOUNTER — Emergency Department (HOSPITAL_BASED_OUTPATIENT_CLINIC_OR_DEPARTMENT_OTHER)
Admission: EM | Admit: 2018-10-30 | Discharge: 2018-10-30 | Disposition: A | Discharge status: Home / Self Care | Payer: BLUE CROSS/BLUE SHIELD | Attending: Emergency Medicine | Admitting: Emergency Medicine

## 2018-10-30 ENCOUNTER — Emergency Department (HOSPITAL_BASED_OUTPATIENT_CLINIC_OR_DEPARTMENT_OTHER): Payer: BLUE CROSS/BLUE SHIELD

## 2018-10-30 ENCOUNTER — Other Ambulatory Visit: Payer: Self-pay

## 2018-10-30 DIAGNOSIS — Y92002 Bathroom of unspecified non-institutional (private) residence single-family (private) house as the place of occurrence of the external cause: Secondary | ICD-10-CM | POA: Diagnosis not present

## 2018-10-30 DIAGNOSIS — W182XXA Fall in (into) shower or empty bathtub, initial encounter: Secondary | ICD-10-CM | POA: Diagnosis not present

## 2018-10-30 DIAGNOSIS — S31010A Laceration without foreign body of lower back and pelvis without penetration into retroperitoneum, initial encounter: Secondary | ICD-10-CM | POA: Insufficient documentation

## 2018-10-30 DIAGNOSIS — Z23 Encounter for immunization: Secondary | ICD-10-CM | POA: Insufficient documentation

## 2018-10-30 DIAGNOSIS — S3141XA Laceration without foreign body of vagina and vulva, initial encounter: Secondary | ICD-10-CM

## 2018-10-30 DIAGNOSIS — Y93E8 Activity, other personal hygiene: Secondary | ICD-10-CM | POA: Diagnosis not present

## 2018-10-30 DIAGNOSIS — Y999 Unspecified external cause status: Secondary | ICD-10-CM | POA: Diagnosis not present

## 2018-10-30 DIAGNOSIS — I10 Essential (primary) hypertension: Secondary | ICD-10-CM | POA: Insufficient documentation

## 2018-10-30 DIAGNOSIS — W19XXXA Unspecified fall, initial encounter: Secondary | ICD-10-CM

## 2018-10-30 MED ORDER — LIDOCAINE HCL (PF) 1 % IJ SOLN
10.0000 mL | Freq: Once | INTRAMUSCULAR | Status: AC
  Administered 2018-10-30: 10 mL
  Filled 2018-10-30: qty 10

## 2018-10-30 MED ORDER — CEPHALEXIN 500 MG PO CAPS: 500.0000 mg | ORAL_CAPSULE | Freq: Three times a day (TID) | ORAL | 0 refills | Status: DC

## 2018-10-30 MED ORDER — TETANUS-DIPHTH-ACELL PERTUSSIS 5-2.5-18.5 LF-MCG/0.5 IM SUSP
0.5000 mL | Freq: Once | INTRAMUSCULAR | Status: AC
  Administered 2018-10-30: 0.5 mL via INTRAMUSCULAR
  Filled 2018-10-30: qty 0.5

## 2018-10-30 MED ORDER — DOCUSATE SODIUM 100 MG PO CAPS: 100.0000 mg | ORAL_CAPSULE | Freq: Every day | ORAL | 0 refills | Status: AC

## 2018-10-30 MED FILL — DOK 100 MG SOFTGEL: 100 | 100 days supply | Qty: 100 | Fill #0

## 2018-10-30 MED FILL — CEPHALEXIN 500 MG CAPSULE: 500 | 7 days supply | Qty: 21 | Fill #0

## 2018-10-30 NOTE — ED Notes (Signed)
Patient transported to X-ray 

## 2018-10-30 NOTE — ED Notes (Signed)
ED Provider at bedside. 

## 2018-10-30 NOTE — ED Notes (Signed)
Pt verbalized understanding of dc instructions.

## 2018-10-30 NOTE — ED Triage Notes (Signed)
States slipped getting out of shower this  am  and has had rectal bleeding since this time, No LOC

## 2018-10-30 NOTE — ED Provider Notes (Signed)
Emergency Department Provider Note   I have reviewed the triage vital signs and the nursing notes.   HISTORY  Chief Complaint Fall   HPI Kyle Levy is a 43 y.o. male with PMH of HTN, HLD, and elevated BMI presents to the emergency department for evaluation of rectal bleeding after fall.  Patient states he was in the shower and reaching out for his towel when he slipped and fell.  He states that the shower has a low, lip for a walk-in shower.  He fell straddling the lip of the shower.  He had bleeding afterwards and pain in the area.  No numbness or tingling.  He did not strike his head or lose consciousness.  No pain in the arms or legs.  No abdominal discomfort.   Past Medical History:  Diagnosis Date  . Anxiety 08/27/2011  . HTN (hypertension) 08/27/2011  . Hyperlipidemia 08/27/2011  . Hypertension   . Morbid obesity (HCC) 09/14/2015  . Psoriasis 09/14/2015    Patient Active Problem List   Diagnosis Date Noted  . Seasonal and perennial allergic rhinitis 11/14/2017  . Obstructive sleep apnea 03/07/2017  . Psoriasis 09/14/2015  . Morbid obesity (HCC) 09/14/2015  . Hematochezia 05/01/2012  . HTN (hypertension) 08/27/2011  . Hyperlipidemia 08/27/2011  . Anxiety 08/27/2011  . Preventative health care 08/21/2011    Past Surgical History:  Procedure Laterality Date  . KNEE ARTHROSCOPY     bilateral knee   Allergies Patient has no known allergies.  Family History  Problem Relation Age of Onset  . Heart disease Father   . Hypertension Father   . Stroke Father     Social History Social History   Tobacco Use  . Smoking status: Never Smoker  . Smokeless tobacco: Never Used  Substance Use Topics  . Alcohol use: Yes    Comment: social  . Drug use: No    Review of Systems  Constitutional: No fever/chills Eyes: No visual changes. ENT: No sore throat. Cardiovascular: Denies chest pain. Respiratory: Denies shortness of breath. Gastrointestinal: No  abdominal pain.  No nausea, no vomiting.  No diarrhea.  No constipation. Genitourinary: Negative for dysuria. Peroneal pain and bleeding after fall.  Musculoskeletal: Negative for back pain. Skin: Negative for rash. Neurological: Negative for headaches, focal weakness or numbness.  10-point ROS otherwise negative.  ____________________________________________   PHYSICAL EXAM:  VITAL SIGNS: ED Triage Vitals  Enc Vitals Group     BP 10/30/18 0720 (!) 153/99     Pulse Rate 10/30/18 0720 74     Resp 10/30/18 0720 16     Temp 10/30/18 0720 98.5 F (36.9 C)     Temp Source 10/30/18 0720 Oral     SpO2 10/30/18 0720 96 %     Weight 10/30/18 0722 (!) 372 lb (168.7 kg)     Height 10/30/18 0722 5\' 11"  (1.803 m)     Pain Score 10/30/18 0726 2   Constitutional: Alert and oriented. Well appearing and in no acute distress. Eyes: Conjunctivae are normal.  Head: Atraumatic. Nose: No congestion/rhinnorhea. Mouth/Throat: Mucous membranes are moist.  Oropharynx non-erythematous. Neck: No stridor.   Cardiovascular: Normal rate, regular rhythm. Good peripheral circulation. Grossly normal heart sounds.   Respiratory: Normal respiratory effort.  No retractions. Lungs CTAB. Gastrointestinal: Soft and nontender. No distention. Perineum with 3 cm laceration midline extending just to the anus but does not appear to extend into the sphincter. Normal sphincter tone on DRE. No indication that rectum is involved in the  injury. No underlying bony tenderness. Musculoskeletal: No lower extremity tenderness nor edema. No gross deformities of extremities. Neurologic:  Normal speech and language. No gross focal neurologic deficits are appreciated.  Skin:  Skin is warm and dry. Perineum laceration as above.   ____________________________________________  RADIOLOGY  Dg Pelvis 1-2 Views  Result Date: 10/30/2018 CLINICAL DATA:  Fall with pelvic pain.  Initial encounter. EXAM: PELVIS - 1-2 VIEW COMPARISON:  None.  FINDINGS: There is no evidence of pelvic fracture or diastasis. No pelvic bone lesions are seen. IMPRESSION: Negative. Electronically Signed   By: Marnee Spring M.D.   On: 10/30/2018 08:00   Dg Sacrum/coccyx  Result Date: 10/30/2018 CLINICAL DATA:  Fall with pelvic pain.  Initial encounter. EXAM: SACRUM AND COCCYX - 2+ VIEW COMPARISON:  None. FINDINGS: No evidence of acute fracture. No sacroiliac diastasis. 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 IMPRESSION: 1. No acute finding. 2. 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 Electronically Signed   By: Marnee Spring M.D.   On: 10/30/2018 08:00    ____________________________________________   PROCEDURES  Procedure(s) performed:   Marland KitchenMarland KitchenLaceration Repair Date/Time: 10/30/2018 11:37 AM Performed by: Maia Plan, MD Authorized by: Maia Plan, MD   Consent:    Consent obtained:  Verbal   Consent given by:  Patient   Risks discussed:  Infection, need for additional repair, nerve damage, pain, poor wound healing, poor cosmetic result and vascular damage Anesthesia (see MAR for exact dosages):    Anesthesia method:  Local infiltration   Local anesthetic:  Lidocaine 1% w/o epi Laceration details:    Location:  Anogenital   Anogenital location:  Perineum   Length (cm):  5 Repair type:    Repair type:  Intermediate Pre-procedure details:    Preparation:  Patient was prepped and draped in usual sterile fashion and imaging obtained to evaluate for foreign bodies Exploration:    Hemostasis achieved with:  Direct pressure   Wound exploration: entire depth of wound probed and visualized     Wound extent: no foreign bodies/material noted, no muscle damage noted, no nerve damage noted, no underlying fracture noted and no vascular damage noted     Contaminated: no   Treatment:    Area cleansed with:  Betadine and saline   Amount of cleaning:  Extensive   Irrigation solution:  Sterile  saline   Irrigation method:  Pressure wash   Visualized foreign bodies/material removed: no   Skin repair:    Repair method:  Sutures   Suture size:  3-0   Wound skin closure material used: Nylon.   Suture technique:  Simple interrupted   Number of sutures:  4 Approximation:    Approximation:  Close Post-procedure details:    Dressing:  Open (no dressing)   Patient tolerance of procedure:  Tolerated well, no immediate complications     ____________________________________________   INITIAL IMPRESSION / ASSESSMENT AND PLAN / ED COURSE  Pertinent labs & imaging results that were available during my care of the patient were reviewed by me and considered in my medical decision making (see chart for details).  Patient presents to the emergency department for evaluation of perineal pain after fall.  He has a laceration of the perineum that does not extend to the anus.  Concern clinically for rectal involvement after digital rectal exam and evaluation through the wound.  Plan for closure with suture. Plain films reviewed and negative.   Plain films reviewed with  no fracture or FB. Wound cleaned and evaluated under local. Would is superficial and I have no findings on exam to suspect rectal involvement. Area was washed and repaired as above without complication. Discussed local wound care and need for return in 7-10 days for suture removal. Discussed that the wound is at increased risk for infection and will start Keflex but also discussed infection/abscess symptoms and need for immediate ED return if those develop. Also provided contact information for surgery at discharge given the location. No extension into the anal sphincter. Starting a stool softener. Patient verbalizes understanding at discharge.  ____________________________________________  FINAL CLINICAL IMPRESSION(S) / ED DIAGNOSES  Final diagnoses:  Fall, initial encounter  Perineal laceration involving skin, initial encounter       MEDICATIONS GIVEN DURING THIS VISIT:  Medications  lidocaine (PF) (XYLOCAINE) 1 % injection 10 mL (10 mLs Infiltration Given 10/30/18 0845)  Tdap (BOOSTRIX) injection 0.5 mL (0.5 mLs Intramuscular Given 10/30/18 0851)     NEW OUTPATIENT MEDICATIONS STARTED DURING THIS VISIT:  Discharge Medication List as of 10/30/2018  8:48 AM    START taking these medications   Details  cephALEXin (KEFLEX) 500 MG capsule Take 1 capsule (500 mg total) by mouth 3 (three) times daily for 7 days., Starting Thu 10/30/2018, Until Thu 11/06/2018, Print    docusate sodium (COLACE) 100 MG capsule Take 1 capsule (100 mg total) by mouth daily for 10 days., Starting Thu 10/30/2018, Until Sun 11/09/2018, Print        Note:  This document was prepared using Dragon voice recognition software and may include unintentional dictation errors.  Alona BeneJoshua Mozetta Murfin, MD Emergency Medicine    Diella Gillingham, Arlyss RepressJoshua G, MD 10/30/18 [phone]

## 2018-10-30 NOTE — Discharge Instructions (Signed)
You were seen in the ED for laceration to the peroneum. This was repaired but remains at increased risk of infection. Return to the ED immediately with any fever, chills, worsening pain, or pus-like drainage from the area. Take the medications as prescribed. The stitches will need to be removed in 7-10 days. Your PCP or the ED can remove these. I have also listed a surgeon who can follow up and remove the stitches given the location.

## 2018-11-03 ENCOUNTER — Encounter: Payer: Self-pay | Admitting: Internal Medicine

## 2018-11-06 ENCOUNTER — Ambulatory Visit (INDEPENDENT_AMBULATORY_CARE_PROVIDER_SITE_OTHER): Payer: BLUE CROSS/BLUE SHIELD | Admitting: Internal Medicine

## 2018-11-06 ENCOUNTER — Encounter: Payer: Self-pay | Admitting: Internal Medicine

## 2018-11-06 DIAGNOSIS — T07XXXA Unspecified multiple injuries, initial encounter: Secondary | ICD-10-CM | POA: Diagnosis not present

## 2018-11-06 DIAGNOSIS — F419 Anxiety disorder, unspecified: Secondary | ICD-10-CM

## 2018-11-06 DIAGNOSIS — I10 Essential (primary) hypertension: Secondary | ICD-10-CM

## 2018-11-06 MED ORDER — CEPHALEXIN 500 MG PO CAPS: 500.0000 mg | ORAL_CAPSULE | Freq: Three times a day (TID) | ORAL | 0 refills | Status: AC

## 2018-11-06 MED ORDER — CEPHALEXIN 500 MG PO CAPS: 500.0000 mg | ORAL_CAPSULE | Freq: Three times a day (TID) | ORAL | 0 refills | Status: DC

## 2018-11-06 MED FILL — CEPHALEXIN 500 MG CAPSULE: 500 | 7 days supply | Qty: 21 | Fill #0

## 2018-11-06 NOTE — Progress Notes (Signed)
Subjective:    Patient ID: Kyle Levy, male    DOB: 1976/02/16, 43 y.o.   MRN: [id number]  HPI  Here after unfortunate slip and fall with laceration to the perineum; seen Feb 6, 4 stitches placed and overall has done well since then.  Has takent he cephalexin, denies worsening pain, fever, swelling or drainage to underwear.  Not having to wear a pad.  Pt denies chest pain, increased sob or doe, wheezing, orthopnea, PND, increased LE swelling, palpitations, dizziness or syncope.  Pt denies new neurological symptoms such as new headache, or facial or extremity weakness or numbness   Pt denies polydipsia, polyuria.  Denies worsening depressive symptoms, suicidal ideation, or panic Past Medical History:  Diagnosis Date  . Anxiety 08/27/2011  . HTN (hypertension) 08/27/2011  . Hyperlipidemia 08/27/2011  . Hypertension   . Morbid obesity (HCC) 09/14/2015  . Psoriasis 09/14/2015   Past Surgical History:  Procedure Laterality Date  . KNEE ARTHROSCOPY     bilateral knee    reports that he has never smoked. He has never used smokeless tobacco. He reports current alcohol use. He reports that he does not use drugs. family history includes Heart disease in his father; Hypertension in his father; Stroke in his father. No Known Allergies Current Outpatient Medications on File Prior to Visit  Medication Sig Dispense Refill  . amLODipine (NORVASC) 10 MG tablet TAKE 1 TABLET BY MOUTH DAILY. MUST KEEP APPT FOR FUTURE REFILLS 90 tablet 3  . atorvastatin (LIPITOR) 20 MG tablet TAKE 1 TABLET BY MOUTH DAILY. MUST KEEP APPT FOR FUTURE REFILLS 90 tablet 3  . busPIRone (BUSPAR) 10 MG tablet Take 1 tablet (10 mg total) by mouth 3 (three) times daily as needed. 90 tablet 5  . docusate sodium (COLACE) 100 MG capsule Take 1 capsule (100 mg total) by mouth daily for 10 days. 10 capsule 0  . escitalopram (LEXAPRO) 20 MG tablet Take 1 tablet (20 mg total) by mouth daily. 90 tablet 2  . losartan (COZAAR) 100 MG tablet  TAKE 1 TABLET BY MOUTH DAILY. MUST KEEP APPT FOR FUTURE REFILLS 90 tablet 3  . Multiple Vitamin (MULTIVITAMIN) tablet Take 1 tablet by mouth daily.      . Omega-3 Fatty Acids (FISH OIL PO) Take by mouth daily.     No current facility-administered medications on file prior to visit.    Review of Systems  Constitutional: Negative for other unusual diaphoresis or sweats HENT: Negative for ear discharge or swelling Eyes: Negative for other worsening visual disturbances Respiratory: Negative for stridor or other swelling  Gastrointestinal: Negative for worsening distension or other blood Genitourinary: Negative for retention or other urinary change Musculoskeletal: Negative for other MSK pain or swelling Skin: Negative for color change or other new lesions Neurological: Negative for worsening tremors and other numbness  Psychiatric/Behavioral: Negative for worsening agitation or other fatigue \\All  other system neg per pt    Objective:   Physical Exam BP 124/90 (BP Location: Left Arm, Patient Position: Sitting, Cuff Size: Large)   Pulse 83   Temp 98.8 F (37.1 C) (Oral)   Wt (!) 378 lb (171.5 kg)   SpO2 94%   BMI 52.72 kg/m  VS noted, supermorbid obese Constitutional: Pt appears in NAD HENT: Head: NCAT.  Right Ear: External ear normal.  Left Ear: External ear normal.  Eyes: . Pupils are equal, round, and reactive to light. Conjunctivae and EOM are normal Nose: without d/c or deformity Neck: Neck supple. Gross normal ROM  Cardiovascular: Normal rate and regular rhythm.   Pulmonary/Chest: Effort normal and breath sounds without rales or wheezing.  Abd:  Soft, NT, ND, + BS, no organomegaly GU: normal male, with apparently loss of 2 placed sutures, and 2 remaining sutures are removed, unfortunately with some minor superficial dehiscne and trace bleeding packed with gauze at the site, no sign or redness, tender, swelling or drainage Neurological: Pt is alert. At baseline orientation,  motor grossly intact Skin: Skin is warm. No rashes, other new lesions, no LE edema Psychiatric: Pt behavior is normal without agitation , mild nervous No other exam findings Lab Results  Component Value Date   WBC 8.3 05/28/2018   HGB 13.8 05/28/2018   HCT 40.7 05/28/2018   PLT 323.0 05/28/2018   GLUCOSE 90 05/28/2018   CHOL 137 05/28/2018   TRIG 157.0 (H) 05/28/2018   HDL 32.90 (L) 05/28/2018   LDLDIRECT 85.0 04/10/2016   LDLCALC 73 05/28/2018   ALT 20 05/28/2018   AST 18 05/28/2018   NA 137 05/28/2018   K 3.8 05/28/2018   CL 101 05/28/2018   CREATININE 0.92 05/28/2018   BUN 16 05/28/2018   CO2 30 05/28/2018   TSH 2.02 05/28/2018   PSA 0.58 05/28/2018        Assessment & Plan:

## 2018-11-06 NOTE — Assessment & Plan Note (Signed)
Stitches removed, ok for repeat cephalexin x 7 day,  to f/u any worsening symptoms or concerns

## 2018-11-06 NOTE — Assessment & Plan Note (Signed)
stable overall by history and exam, recent data reviewed with pt, and pt to continue medical treatment as before,  to f/u any worsening symptoms or concerns  

## 2018-11-06 NOTE — Patient Instructions (Signed)
Your stitches were removed today  Please take all new medication as prescribed - the antibiotic  Please continue all other medications as before, and refills have been done if requested.  Please have the pharmacy call with any other refills you may need.  Please continue your efforts at being more active, low cholesterol diet, and weight control.  Please keep your appointments with your specialists as you may have planned

## 2018-12-22 NOTE — Assessment & Plan Note (Signed)
He is too heavy and this was reviewed. Emphasis on long term. Consider Bariatric counseling referral

## 2018-12-22 NOTE — Assessment & Plan Note (Signed)
He is benefiting from CPAP with improved sleep. Plan- continue CPAP auto 5-15

## 2019-02-09 MED FILL — ACETAMINOPHEN/COD #4 TABLET: 300-60 | 5 days supply | Qty: 20 | Fill #0

## 2019-05-27 ENCOUNTER — Other Ambulatory Visit: Payer: Self-pay | Admitting: Internal Medicine

## 2019-06-02 ENCOUNTER — Encounter: Payer: 59 | Admitting: Internal Medicine

## 2019-06-04 ENCOUNTER — Encounter: Payer: Self-pay | Admitting: Internal Medicine

## 2019-06-04 ENCOUNTER — Other Ambulatory Visit: Payer: Self-pay

## 2019-06-04 ENCOUNTER — Ambulatory Visit (INDEPENDENT_AMBULATORY_CARE_PROVIDER_SITE_OTHER): Payer: BC Managed Care – PPO | Admitting: Internal Medicine

## 2019-06-04 VITALS — BP 138/86 | HR 71 | Temp 97.7°F | Ht 71.0 in | Wt 383.0 lb

## 2019-06-04 DIAGNOSIS — Z23 Encounter for immunization: Secondary | ICD-10-CM | POA: Diagnosis not present

## 2019-06-04 DIAGNOSIS — Z0000 Encounter for general adult medical examination without abnormal findings: Secondary | ICD-10-CM

## 2019-06-04 MED ORDER — ATORVASTATIN CALCIUM 20 MG PO TABS: ORAL_TABLET | ORAL | 3 refills | Status: DC

## 2019-06-04 MED ORDER — AMLODIPINE BESYLATE 10 MG PO TABS: ORAL_TABLET | ORAL | 3 refills | Status: DC

## 2019-06-04 MED ORDER — LOSARTAN POTASSIUM 100 MG PO TABS: ORAL_TABLET | ORAL | 3 refills | Status: DC

## 2019-06-04 MED ORDER — BUSPIRONE HCL 10 MG PO TABS: 10.0000 mg | ORAL_TABLET | Freq: Three times a day (TID) | ORAL | 5 refills | Status: DC | PRN

## 2019-06-04 MED ORDER — ESCITALOPRAM OXALATE 20 MG PO TABS: 20.0000 mg | ORAL_TABLET | Freq: Every day | ORAL | 3 refills | Status: DC

## 2019-06-04 MED FILL — busPIRone HCL 10 MG TABS: 10 | 30 days supply | Qty: 90 | Fill #0

## 2019-06-04 NOTE — Progress Notes (Signed)
Subjective:    Patient ID: Kyle Levy, male    DOB: May 06, 1976, 43 y.o.   MRN: [id number]  HPI  Here for wellness and f/u;  Overall doing ok;  Pt denies Chest pain, worsening SOB, DOE, wheezing, orthopnea, PND, worsening LE edema, palpitations, dizziness or syncope.  Pt denies neurological change such as new headache, facial or extremity weakness.  Pt denies polydipsia, polyuria, or low sugar symptoms. Pt states overall good compliance with treatment and medications, good tolerability, and has been trying to follow appropriate diet.  Pt denies worsening depressive symptoms, suicidal ideation or panic. No fever, night sweats, wt loss, loss of appetite, or other constitutional symptoms.  Pt states good ability with ADL's, has low fall risk, home safety reviewed and adequate, no other significant changes in hearing or vision, and only occasionally active with exercise. Wt Readings from Last 3 Encounters:  06/04/19 (!) 383 lb (173.7 kg)  11/06/18 (!) 378 lb (171.5 kg)  10/30/18 (!) 372 lb (168.7 kg)   Past Medical History:  Diagnosis Date  . Anxiety 08/27/2011  . HTN (hypertension) 08/27/2011  . Hyperlipidemia 08/27/2011  . Hypertension   . Morbid obesity (HCC) 09/14/2015  . Psoriasis 09/14/2015   Past Surgical History:  Procedure Laterality Date  . KNEE ARTHROSCOPY     bilateral knee    reports that he has never smoked. He has never used smokeless tobacco. He reports current alcohol use. He reports that he does not use drugs. family history includes Heart disease in his father; Hypertension in his father; Stroke in his father. Not on File Current Outpatient Medications on File Prior to Visit  Medication Sig Dispense Refill  . Multiple Vitamin (MULTIVITAMIN) tablet Take 1 tablet by mouth daily.      . Omega-3 Fatty Acids (FISH OIL PO) Take by mouth daily.     No current facility-administered medications on file prior to visit.    Review of Systems Constitutional: Negative for other  unusual diaphoresis, sweats, appetite or weight changes HENT: Negative for other worsening hearing loss, ear pain, facial swelling, mouth sores or neck stiffness.   Eyes: Negative for other worsening pain, redness or other visual disturbance.  Respiratory: Negative for other stridor or swelling Cardiovascular: Negative for other palpitations or other chest pain  Gastrointestinal: Negative for worsening diarrhea or loose stools, blood in stool, distention or other pain Genitourinary: Negative for hematuria, flank pain or other change in urine volume.  Musculoskeletal: Negative for myalgias or other joint swelling.  Skin: Negative for other color change, or other wound or worsening drainage.  Neurological: Negative for other syncope or numbness. Hematological: Negative for other adenopathy or swelling Psychiatric/Behavioral: Negative for hallucinations, other worsening agitation, SI, self-injury, or new decreased concentration All other system neg per pt    Objective:   Physical Exam BP 138/86   Pulse 71   Temp 97.7 F (36.5 C) (Oral)   Ht 5\' 11"  (1.803 m)   Wt (!) 383 lb (173.7 kg)   SpO2 98%   BMI 53.42 kg/m  VS noted,  Constitutional: Pt is oriented to person, place, and time. Appears well-developed and well-nourished, in no significant distress and comfortable Head: Normocephalic and atraumatic  Eyes: Conjunctivae and EOM are normal. Pupils are equal, round, and reactive to light Right Ear: External ear normal without discharge Left Ear: External ear normal without discharge Nose: Nose without discharge or deformity Mouth/Throat: Oropharynx is without other ulcerations and moist  Neck: Normal range of motion. Neck supple. No  JVD present. No tracheal deviation present or significant neck LA or mass Cardiovascular: Normal rate, regular rhythm, normal heart sounds and intact distal pulses.   Pulmonary/Chest: WOB normal and breath sounds without rales or wheezing  Abdominal: Soft.  Bowel sounds are normal. NT. No HSM  Musculoskeletal: Normal range of motion. Exhibits no edema Lymphadenopathy: Has no other cervical adenopathy.  Neurological: Pt is alert and oriented to person, place, and time. Pt has normal reflexes. No cranial nerve deficit. Motor grossly intact, Gait intact Skin: Skin is warm and dry. No rash noted or new ulcerations Psychiatric:  Has normal mood and affect. Behavior is normal without agitation No other exam findings Lab Results  Component Value Date   WBC 8.3 05/28/2018   HGB 13.8 05/28/2018   HCT 40.7 05/28/2018   PLT 323.0 05/28/2018   GLUCOSE 90 05/28/2018   CHOL 137 05/28/2018   TRIG 157.0 (H) 05/28/2018   HDL 32.90 (L) 05/28/2018   LDLDIRECT 85.0 04/10/2016   LDLCALC 73 05/28/2018   ALT 20 05/28/2018   AST 18 05/28/2018   NA 137 05/28/2018   K 3.8 05/28/2018   CL 101 05/28/2018   CREATININE 0.92 05/28/2018   BUN 16 05/28/2018   CO2 30 05/28/2018   TSH 2.02 05/28/2018   PSA 0.58 05/28/2018          Assessment & Plan:

## 2019-06-04 NOTE — Assessment & Plan Note (Signed)

## 2019-06-04 NOTE — Patient Instructions (Addendum)
You had the flu shot today  Please continue all other medications as before, and refills have been done if requested.  Please have the pharmacy call with any other refills you may need.  Please continue your efforts at being more active, low cholesterol diet, and weight control.  You are otherwise up to date with prevention measures today.  Please keep your appointments with your specialists as you may have planned  Please go to the LAB in the Basement (turn left off the elevator) for the tests to be done at your convenience  You will be contacted by phone if any changes need to be made immediately.  Otherwise, you will receive a letter about your results with an explanation, but please check with MyChart first.  Please remember to sign up for MyChart if you have not done so, as this will be important to you in the future with finding out test results, communicating by private email, and scheduling acute appointments online when needed.  Please return in 1 year for your yearly visit, or sooner if needed, with Lab testing done 3-5 days before    

## 2019-06-26 ENCOUNTER — Ambulatory Visit (INDEPENDENT_AMBULATORY_CARE_PROVIDER_SITE_OTHER): Payer: BC Managed Care – PPO | Admitting: Family Medicine

## 2019-06-26 ENCOUNTER — Encounter: Payer: Self-pay | Admitting: Family Medicine

## 2019-06-26 ENCOUNTER — Other Ambulatory Visit: Payer: Self-pay

## 2019-06-26 ENCOUNTER — Ambulatory Visit: Payer: Self-pay

## 2019-06-26 VITALS — BP 130/80 | HR 70 | Ht 71.0 in

## 2019-06-26 DIAGNOSIS — M25512 Pain in left shoulder: Secondary | ICD-10-CM

## 2019-06-26 DIAGNOSIS — G8929 Other chronic pain: Secondary | ICD-10-CM

## 2019-06-26 DIAGNOSIS — M7552 Bursitis of left shoulder: Secondary | ICD-10-CM | POA: Diagnosis not present

## 2019-06-26 MED ORDER — PENNSAID 2 % TD SOLN: 2.0000 g | Freq: Two times a day (BID) | TRANSDERMAL | 3 refills | Status: DC

## 2019-06-26 NOTE — Patient Instructions (Signed)
Good to see you.  Ice 20 minutes 2 times daily. Usually after activity and before bed. Exercises 3 times a week.  pennsaid pinkie amount topically 2 times daily as needed.  Vitamin D 2000 IU daily  See me again in 6 weeks  

## 2019-06-26 NOTE — Progress Notes (Signed)
[facility] [address] [place], [place] [address] Phone: [phone] Subjective:   I Ronelle Nigh am serving as a Neurosurgeon for Dr. Antoine Primas.    CC: Left shoulder pain  MPN:[id number]  Aydian Dimmick is a 43 y.o. male coming in with complaint of left shoulder pain. Has neck pain but not sure if it is related to the shoulder. Left sided sleeper.   Onset- Chronic  Location - shoulder joint  Character- sharp, tight  Aggravating factors- buttoning pants (adduction), flexion Therapies tried- topical Severity-  6/10      Past Medical History:  Diagnosis Date  . Anxiety 08/27/2011  . HTN (hypertension) 08/27/2011  . Hyperlipidemia 08/27/2011  . Hypertension   . Morbid obesity (HCC) 09/14/2015  . Psoriasis 09/14/2015   Past Surgical History:  Procedure Laterality Date  . KNEE ARTHROSCOPY     bilateral knee   Social History   Socioeconomic History  . Marital status: Married    Spouse name: Not on file  . Number of children: Not on file  . Years of education: 78  . Highest education level: Not on file  Occupational History  . Occupation: Surveyor, quantity  Social Needs  . Financial resource strain: Not on file  . Food insecurity    Worry: Not on file    Inability: Not on file  . Transportation needs    Medical: Not on file    Non-medical: Not on file  Tobacco Use  . Smoking status: Never Smoker  . Smokeless tobacco: Never Used  Substance and Sexual Activity  . Alcohol use: Yes    Comment: social  . Drug use: No  . Sexual activity: Not on file  Lifestyle  . Physical activity    Days per week: Not on file    Minutes per session: Not on file  . Stress: Not on file  Relationships  . Social Musician on phone: Not on file    Gets together: Not on file    Attends religious service: Not on file    Active member of club or organization: Not on file    Attends meetings of clubs or organizations: Not on file   Relationship status: Not on file  Other Topics Concern  . Not on file  Social History Narrative  . Not on file   Not on File Family History  Problem Relation Age of Onset  . Heart disease Father   . Hypertension Father   . Stroke Father      Current Outpatient Medications (Cardiovascular):  .  amLODipine (NORVASC) 10 MG tablet, TAKE 1 TABLET BY MOUTH DAILY. MUST KEEP APPT FOR FUTURE REFILLS .  atorvastatin (LIPITOR) 20 MG tablet, TAKE 1 TABLET BY MOUTH DAILY. MUST KEEP APPT FOR FUTURE REFILLS .  losartan (COZAAR) 100 MG tablet, TAKE 1 TABLET BY MOUTH DAILY. MUST KEEP APPT FOR FUTURE REFILLS     Current Outpatient Medications (Other):  .  busPIRone (BUSPAR) 10 MG tablet, Take 1 tablet (10 mg total) by mouth 3 (three) times daily as needed. Marland Kitchen  escitalopram (LEXAPRO) 20 MG tablet, Take 1 tablet (20 mg total) by mouth daily. .  Multiple Vitamin (MULTIVITAMIN) tablet, Take 1 tablet by mouth daily.   .  Omega-3 Fatty Acids (FISH OIL PO), Take by mouth daily. .  Diclofenac Sodium (PENNSAID) 2 % SOLN, Place 2 g onto the skin 2 (two) times daily.    Past medical history, social, surgical and  family history all reviewed in electronic medical record.  No pertanent information unless stated regarding to the chief complaint.   Review of Systems:  No headache, visual changes, nausea, vomiting, diarrhea, constipation, dizziness, abdominal pain, skin rash, fevers, chills, night sweats, weight loss, swollen lymph nodes, body aches, joint swelling,  chest pain, shortness of breath, mood changes.  Positive muscle aches  Objective  Blood pressure 130/80, pulse 70, height 5\' 11"  (1.803 m), SpO2 97 %.    General: No apparent distress alert and oriented x3 mood and affect normal, dressed appropriately.  HEENT: Pupils equal, extraocular movements intact  Respiratory: Patient's speak in full sentences and does not appear short of breath  Cardiovascular: No lower extremity edema, non tender, no  erythema  Skin: Warm dry intact with no signs of infection or rash on extremities or on axial skeleton.  Abdomen: Soft nontender  Neuro: Cranial nerves II through XII are intact, neurovascularly intact in all extremities with 2+ DTRs and 2+ pulses.  Lymph: No lymphadenopathy of posterior or anterior cervical chain or axillae bilaterally.  Gait normal with good balance and coordination.  MSK:  Non tender with full range of motion and good stability and symmetric strength and tone of shoulders, elbows, wrist, hip, knee and ankles bilaterally.  Shoulder: left Inspection reveals no abnormalities, atrophy or asymmetry.  Difficult to assess secondary to patient's body habitus Palpation is normal with no tenderness over AC joint or bicipital groove. ROM is full in all planes passively. Rotator cuff strength normal throughout. signs of impingement with positive Neer and Hawkin's tests, but negative empty can sign. Speeds and Yergason's tests normal. No labral pathology noted with negative Obrien's, negative clunk and good stability. Normal scapular function observed. No painful arc and no drop arm sign. No apprehension sign  MSK US performed of: left This study was ordered, performed, and interpreted by Charlann Boxer D.O.  Shoulder: Difficult scan overall secondary to patient's body habitus Supraspinatus:  Appears normal on long and transverse views, Bursal bulge seen with shoulder abduction on impingement view. Infraspinatus:  Appears normal on long and transverse views. Significant increase in Doppler flow Subscapularis:  Appears normal on long and transverse views. Positive bursa Teres Minor:  Appears normal on long and transverse views. AC joint:  Capsule undistended, no geyser sign. Glenohumeral Joint:  Appears normal without effusion. Glenoid Labrum:  Intact without visualized tears. Biceps Tendon:  Appears normal on long and transverse views, no fraying of tendon, tendon located in  intertubercular groove, no subluxation with shoulder internal or external rotation.  Impression: Subacromial bursitis  97110; 15 additional minutes spent for Therapeutic exercises as stated in above notes.  This included exercises focusing on stretching, strengthening, with significant focus on eccentric aspects.   Long term goals include an improvement in range of motion, strength, endurance as well as avoiding reinjury. Patient's frequency would include in 1-2 times a day, 3-5 times a week for a duration of 6-12 weeks.Shoulder Exercises that included:  Basic scapular stabilization to include adduction and depression of scapula Scaption, focusing on proper movement and good control Internal and External rotation utilizing a theraband, with elbow tucked at side entire time Rows with theraband given    Proper technique shown and discussed handout in great detail with ATC.  All questions were discussed and answered.     Impression and Recommendations:     This case required medical decision making of moderate complexity. The above documentation has been reviewed and is accurate and complete Alroy Dust  Koren Bound, DO       Note: This dictation was prepared with Dragon dictation along with smaller phrase technology. Any transcriptional errors that result from this process are unintentional.

## 2019-06-26 NOTE — Assessment & Plan Note (Signed)
Bursitis noted.  Discussed icing regimen.  Patient declined any type of injection.  Topical anti-inflammatories given, which activities of doing which wants to avoid.  Follow-up in 4 to 6 weeks.  Continue to have pain we will consider the possibility of injections and formal physical therapy

## 2019-08-10 ENCOUNTER — Ambulatory Visit (INDEPENDENT_AMBULATORY_CARE_PROVIDER_SITE_OTHER): Payer: BC Managed Care – PPO | Admitting: Family Medicine

## 2019-08-10 ENCOUNTER — Encounter: Payer: Self-pay | Admitting: Family Medicine

## 2019-08-10 ENCOUNTER — Ambulatory Visit: Payer: Self-pay

## 2019-08-10 VITALS — BP 132/88 | HR 68 | Ht 71.0 in | Wt 384.0 lb

## 2019-08-10 DIAGNOSIS — G8929 Other chronic pain: Secondary | ICD-10-CM

## 2019-08-10 DIAGNOSIS — M25512 Pain in left shoulder: Secondary | ICD-10-CM

## 2019-08-10 DIAGNOSIS — M7552 Bursitis of left shoulder: Secondary | ICD-10-CM | POA: Diagnosis not present

## 2019-08-10 MED ORDER — VITAMIN D (ERGOCALCIFEROL) 1.25 MG (50000 UNIT) PO CAPS: 50000.0000 [IU] | ORAL_CAPSULE | ORAL | 0 refills | Status: DC

## 2019-08-10 MED ORDER — DICLOFENAC SODIUM 2 % EX SOLN: 2.0000 g | Freq: Two times a day (BID) | CUTANEOUS | 3 refills | Status: DC

## 2019-08-10 MED FILL — VIT D2 1.25 MG (50,000 UNIT: 1.25 MG | 84 days supply | Qty: 12 | Fill #0

## 2019-08-10 NOTE — Progress Notes (Signed)
[facility] [place], [place] [address] Phone: [phone] Subjective:   [name], am serving as a scribe for Dr. Hulan Saas.  I'm seeing this patient by the request  of:  Biagio Borg, MD   CC: Left shoulder pain  ASN:[id number]   06/26/2019 Bursitis noted.  Discussed icing regimen.  Patient declined any type of injection.  Topical anti-inflammatories given, which activities of doing which wants to avoid.  Follow-up in 4 to 6 weeks.  Continue to have pain we will consider the possibility of injections and formal physical therapy  Update 08/10/2019 Kyle Levy is a 43 y.o. male coming in with complaint of left shoulder pain. Patient states he is noticing come improvement. Has done HEP. Still having pain with lifting heavy items. Pain in anterior shoulder that radiates into posterior aspect.  Patient would state that he is feeling better from previous exam but it is slow.     Past Medical History:  Diagnosis Date  . Anxiety 08/27/2011  . HTN (hypertension) 08/27/2011  . Hyperlipidemia 08/27/2011  . Hypertension   . Morbid obesity ([place]) 09/14/2015  . Psoriasis 09/14/2015   Past Surgical History:  Procedure Laterality Date  . KNEE ARTHROSCOPY     bilateral knee   Social History   Socioeconomic History  . Marital status: Married    Spouse name: Not on file  . Number of children: Not on file  . Years of education: 2  . Highest education level: Not on file  Occupational History  . Occupation: Tour manager  Social Needs  . Financial resource strain: Not on file  . Food insecurity    Worry: Not on file    Inability: Not on file  . Transportation needs    Medical: Not on file    Non-medical: Not on file  Tobacco Use  . Smoking status: Never Smoker  . Smokeless tobacco: Never Used  Substance and Sexual Activity  . Alcohol use: Yes    Comment: social  . Drug use: No  . Sexual activity: Not on file   Lifestyle  . Physical activity    Days per week: Not on file    Minutes per session: Not on file  . Stress: Not on file  Relationships  . Social Herbalist on phone: Not on file    Gets together: Not on file    Attends religious service: Not on file    Active member of club or organization: Not on file    Attends meetings of clubs or organizations: Not on file    Relationship status: Not on file  Other Topics Concern  . Not on file  Social History Narrative  . Not on file   Not on File Family History  Problem Relation Age of Onset  . Heart disease Father   . Hypertension Father   . Stroke Father      Current Outpatient Medications (Cardiovascular):  .  amLODipine (NORVASC) 10 MG tablet, TAKE 1 TABLET BY MOUTH DAILY. MUST KEEP APPT FOR FUTURE REFILLS .  atorvastatin (LIPITOR) 20 MG tablet, TAKE 1 TABLET BY MOUTH DAILY. MUST KEEP APPT FOR FUTURE REFILLS .  losartan (COZAAR) 100 MG tablet, TAKE 1 TABLET BY MOUTH DAILY. MUST KEEP APPT FOR FUTURE REFILLS     Current Outpatient Medications (Other):  .  busPIRone (BUSPAR) 10 MG tablet, Take 1 tablet (10 mg total) by mouth 3 (three) times daily as needed. Marland Kitchen  Diclofenac Sodium (PENNSAID) 2 % SOLN, Place 2 g onto the skin 2 (two) times daily. Marland Kitchen  escitalopram (LEXAPRO) 20 MG tablet, Take 1 tablet (20 mg total) by mouth daily. .  Multiple Vitamin (MULTIVITAMIN) tablet, Take 1 tablet by mouth daily.   .  Omega-3 Fatty Acids (FISH OIL PO), Take by mouth daily. .  Diclofenac Sodium 2 % SOLN, Place 2 g onto the skin 2 (two) times daily. .  Vitamin D, Ergocalciferol, (DRISDOL) 1.25 MG (50000 UT) CAPS capsule, Take 1 capsule (50,000 Units total) by mouth every 7 (seven) days.    Past medical history, social, surgical and family history all reviewed in electronic medical record.  No pertanent information unless stated regarding to the chief complaint.   Review of Systems:  No headache, visual changes, nausea, vomiting,  diarrhea, constipation, dizziness, abdominal pain, skin rash, fevers, chills, night sweats, weight loss, swollen lymph nodes, body aches, joint swelling, muscle aches, chest pain, shortness of breath, mood changes.   Objective  Blood pressure 132/88, pulse 68, height 5\' 11"  (1.803 m), weight (!) 384 lb (174.2 kg), SpO2 98 %.    General: No apparent distress alert and oriented x3 mood and affect normal, dressed appropriately.  Morbidly obese HEENT: Pupils equal, extraocular movements intact  Respiratory: Patient's speak in full sentences and does not appear short of breath  Cardiovascular: Trace lower extremity edema, non tender, no erythema  Skin: Warm dry intact with no signs of infection or rash on extremities or on axial skeleton.  Abdomen: Soft nontender  Neuro: Cranial nerves II through XII are intact, neurovascularly intact in all extremities with 2+ DTRs and 2+ pulses.  Lymph: No lymphadenopathy of posterior or anterior cervical chain or axillae bilaterally.  Gait antalgic MSK:  Non tender with full range of motion and good stability and symmetric strength and tone of  elbows, wrist, hip, knee and ankles bilaterally.  Left shoulder exam shows the patient does have positive impingement still noted.  With .  Patient does have near full range of motion though.  Patient's rotator cuff strength 4+ out of 5 compared to contralateral side.  Contralateral shoulder unremarkable  Limited musculoskeletal ultrasound was performed and interpreted by [employer]  Limited ultrasound of patient's left shoulder shows the patient still has hypoechoic changes around the bicep tendon noted.  No true tearing appreciated.  Rotator cuff difficult to assess secondary to patient's body habitus but no true tear appreciated.  Impression: Continued bursitis with tendinitis   Impression and Recommendations:     This case required medical decision making of moderate complexity. The above  documentation has been reviewed and is accurate and complete Judi Saa, DO       Note: This dictation was prepared with Dragon dictation along with smaller phrase technology. Any transcriptional errors that result from this process are unintentional.

## 2019-08-10 NOTE — Assessment & Plan Note (Signed)
Patient is making improvement.  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  Declined injection again.  Topical anti-inflammatories prescribed, once weekly vitamin D prescribed.  Discussed continuing the exercises and follow-up again in 2 months

## 2019-08-10 NOTE — Patient Instructions (Signed)
Once weekly vitamin d Pennsaid 2x a day [phone] See me in 2 months

## 2019-08-31 ENCOUNTER — Other Ambulatory Visit: Payer: Self-pay | Admitting: Internal Medicine

## 2019-10-06 ENCOUNTER — Ambulatory Visit (INDEPENDENT_AMBULATORY_CARE_PROVIDER_SITE_OTHER): Payer: BC Managed Care – PPO | Admitting: Family Medicine

## 2019-10-06 ENCOUNTER — Encounter: Payer: Self-pay | Admitting: Family Medicine

## 2019-10-06 ENCOUNTER — Other Ambulatory Visit: Payer: Self-pay

## 2019-10-06 DIAGNOSIS — M7552 Bursitis of left shoulder: Secondary | ICD-10-CM | POA: Diagnosis not present

## 2019-10-06 NOTE — Assessment & Plan Note (Signed)
Patient has made significant progress at this time.  No change in management.  We discussed trying to do more icing on a regular basis.  As long as patient continues to improve with increasing activity patient will follow-up more on an as-needed basis but we will schedule an appointment in 2 months just in case.

## 2019-10-06 NOTE — Progress Notes (Signed)
[facility] [address] [place] [address] Phone: [phone] Subjective:   Kyle Levy, am serving as a scribe for Dr. Antoine Primas. This visit occurred during the SARS-CoV-2 public health emergency.  Safety protocols were in place, including screening questions prior to the visit, additional usage of staff PPE, and extensive cleaning of exam room while observing appropriate contact time as indicated for disinfecting solutions.    CC: Left shoulder pain  [id number]   08/10/2019 Patient is making improvement.  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  Declined injection again.  Topical anti-inflammatories prescribed, once weekly vitamin D prescribed.  Discussed continuing the exercises and follow-up again in 2 months  Update 10/06/2019 Kyle Levy is a 44 y.o. male coming in with complaint of left shoulder pain. Patient states that he has been using the Mirror and doing some boxing workouts. Soreness afterwards. Pain when he sleeps on the left side. Has only had to Pennsaid in the mornings.  Would state feeling 85% better overall.  Motivated to work out and lose weight.     Past Medical History:  Diagnosis Date  . Anxiety 08/27/2011  . HTN (hypertension) 08/27/2011  . Hyperlipidemia 08/27/2011  . Hypertension   . Morbid obesity (HCC) 09/14/2015  . Psoriasis 09/14/2015   Past Surgical History:  Procedure Laterality Date  . KNEE ARTHROSCOPY     bilateral knee   Social History   Socioeconomic History  . Marital status: Married    Spouse name: Not on file  . Number of children: Not on file  . Years of education: 38  . Highest education level: Not on file  Occupational History  . Occupation: Surveyor, quantity  Tobacco Use  . Smoking status: Never Smoker  . Smokeless tobacco: Never Used  Substance and Sexual Activity  . Alcohol use: Yes    Comment: social  . Drug use: No  . Sexual  activity: Not on file  Other Topics Concern  . Not on file  Social History Narrative  . Not on file   Social Determinants of Health   Financial Resource Strain:   . Difficulty of Paying Living Expenses: Not on file  Food Insecurity:   . Worried About Programme researcher, broadcasting/film/video in the Last Year: Not on file  . Ran Out of Food in the Last Year: Not on file  Transportation Needs:   . Lack of Transportation (Medical): Not on file  . Lack of Transportation (Non-Medical): Not on file  Physical Activity:   . Days of Exercise per Week: Not on file  . Minutes of Exercise per Session: Not on file  Stress:   . Feeling of Stress : Not on file  Social Connections:   . Frequency of Communication with Friends and Family: Not on file  . Frequency of Social Gatherings with Friends and Family: Not on file  . Attends Religious Services: Not on file  . Active Member of Clubs or Organizations: Not on file  . Attends Banker Meetings: Not on file  . Marital Status: Not on file   Not on File Family History  Problem Relation Age of Onset  . Heart disease Father   . Hypertension Father   . Stroke Father      Current Outpatient Medications (Cardiovascular):  .  amLODipine (NORVASC) 10 MG tablet, TAKE 1 TABLET DAILY. Marland Kitchen  atorvastatin (LIPITOR) 20 MG tablet, TAKE 1 TABLET DAILY. Marland Kitchen  losartan (COZAAR) 100 MG  tablet, TAKE 1 TABLET DAILY.     Current Outpatient Medications (Other):  .  busPIRone (BUSPAR) 10 MG tablet, Take 1 tablet (10 mg total) by mouth 3 (three) times daily as needed. .  Diclofenac Sodium (PENNSAID) 2 % SOLN, Place 2 g onto the skin 2 (two) times daily. .  Diclofenac Sodium 2 % SOLN, Place 2 g onto the skin 2 (two) times daily. Marland Kitchen  escitalopram (LEXAPRO) 20 MG tablet, Take 1 tablet (20 mg total) by mouth daily. .  Multiple Vitamin (MULTIVITAMIN) tablet, Take 1 tablet by mouth daily.   .  Omega-3 Fatty Acids (FISH OIL PO), Take by mouth daily. .  Vitamin D, Ergocalciferol,  (DRISDOL) 1.25 MG (50000 UT) CAPS capsule, Take 1 capsule (50,000 Units total) by mouth every 7 (seven) days.    Past medical history, social, surgical and family history all reviewed in electronic medical record.  No pertanent information unless stated regarding to the chief complaint.   Review of Systems:  No headache, visual changes, nausea, vomiting, diarrhea, constipation, dizziness, abdominal pain, skin rash, fevers, chills, night sweats, weight loss, swollen lymph nodes, body aches, joint swelling, muscle aches, chest pain, shortness of breath, mood changes.   Objective  Blood pressure 118/90, pulse 79, height 5\' 11"  (1.803 m), weight (!) 391 lb (177.4 kg), SpO2 98 %.    General: No apparent distress alert and oriented x3 mood and affect normal, dressed appropriately.  Morbidly obese HEENT: Pupils equal, extraocular movements intact  Respiratory: Patient's speak in full sentences and does not appear short of breath  Cardiovascular: Trace lower extremity edema, non tender, no erythema  Skin: Warm dry intact with no signs of infection or rash on extremities or on axial skeleton.  Abdomen: Soft nontender  Neuro: Cranial nerves II through XII are intact, neurovascularly intact in all extremities with 2+ DTRs and 2+ pulses.  Lymph: No lymphadenopathy of posterior or anterior cervical chain or axillae bilaterally.  Gait normal with good balance and coordination.  MSK: Left shoulder exam has some very mild impingement noted and very mild crossover.  Otherwise 5 out of 5 strength of the rotator cuff near full range of motion.    Impression and Recommendations:      The above documentation has been reviewed and is accurate and complete Lyndal Pulley, DO       Note: This dictation was prepared with Dragon dictation along with smaller phrase technology. Any transcriptional errors that result from this process are unintentional.

## 2019-10-06 NOTE — Patient Instructions (Signed)
Continue exercises Eat within working out [employer] after workout  See me in 2 months if not perfect

## 2019-10-11 ENCOUNTER — Ambulatory Visit: Payer: BC Managed Care – PPO | Attending: Internal Medicine

## 2019-10-11 DIAGNOSIS — Z23 Encounter for immunization: Secondary | ICD-10-CM | POA: Insufficient documentation

## 2019-10-11 NOTE — Progress Notes (Signed)
   Covid-19 Vaccination Clinic  Name:  Jahrel Borthwick    MRN: [id number] DOB: Oct 06, 1975  10/11/2019  Mr. Leandro was observed post Covid-19 immunization for 15 minutes without incidence. He was provided with Vaccine Information Sheet and instruction to access the V-Safe system.   Mr. Mayabb was instructed to call 911 with any severe reactions post vaccine: Marland Kitchen Difficulty breathing  . Swelling of your face and throat  . A fast heartbeat  . A bad rash all over your body  . Dizziness and weakness

## 2019-10-31 ENCOUNTER — Ambulatory Visit: Payer: BC Managed Care – PPO | Attending: Internal Medicine

## 2019-10-31 DIAGNOSIS — Z23 Encounter for immunization: Secondary | ICD-10-CM

## 2019-10-31 NOTE — Progress Notes (Signed)
   Covid-19 Vaccination Clinic  Name:  Zelig Gacek    MRN: [id number] DOB: 01-Nov-1975  10/31/2019  Mr. Godsey was observed post Covid-19 immunization for 15 minutes without incidence. He was provided with Vaccine Information Sheet and instruction to access the V-Safe system.   Mr. Emily was instructed to call 911 with any severe reactions post vaccine: Marland Kitchen Difficulty breathing  . Swelling of your face and throat  . A fast heartbeat  . A bad rash all over your body  . Dizziness and weakness    Immunizations Administered    Name Date Dose VIS Date Route   Pfizer COVID-19 Vaccine 10/31/2019 12:12 PM 0.3 mL 09/04/2019 Intramuscular   Manufacturer: [employer], [employer]   Lot: [id number]   NDC: [address]-7

## 2019-12-08 ENCOUNTER — Ambulatory Visit: Payer: BC Managed Care – PPO | Admitting: Family Medicine

## 2019-12-14 ENCOUNTER — Other Ambulatory Visit: Payer: Self-pay | Admitting: Internal Medicine

## 2020-01-04 ENCOUNTER — Encounter: Payer: Self-pay | Admitting: Internal Medicine

## 2020-01-04 MED ORDER — LEVOCETIRIZINE DIHYDROCHLORIDE 5 MG PO TABS: 5.0000 mg | ORAL_TABLET | Freq: Every evening | ORAL | 3 refills | Status: DC

## 2020-01-04 MED FILL — LEVOCETIRIZINE 5 MG TABLET: 5 | 30 days supply | Qty: 30 | Fill #0

## 2020-01-04 NOTE — Addendum Note (Signed)
Addended by: Deatra James on: 01/04/2020 11:40 AM   Modules accepted: Orders

## 2020-01-04 NOTE — Telephone Encounter (Signed)
Pt sent email requesting MD to send rx in for Zyzal for his allergies. Pls advise since MD is out of the office.Marland KitchenRaechel Chute

## 2020-02-08 MED FILL — LEVOCETIRIZINE 5 MG TABLET: 5 | 30 days supply | Qty: 30 | Fill #1

## 2020-04-20 ENCOUNTER — Encounter: Payer: Self-pay | Admitting: Internal Medicine

## 2020-04-20 MED ORDER — BUSPIRONE HCL 10 MG PO TABS: 10.0000 mg | ORAL_TABLET | Freq: Three times a day (TID) | ORAL | 0 refills | Status: DC | PRN

## 2020-06-07 ENCOUNTER — Other Ambulatory Visit: Payer: Self-pay | Admitting: Internal Medicine

## 2020-06-07 MED ORDER — ESCITALOPRAM OXALATE 10 MG PO TABS: 10.0000 mg | ORAL_TABLET | Freq: Every day | ORAL | 0 refills | Status: DC

## 2020-06-07 MED FILL — ESCITALOPRAM 10 MG TABLET: 10 | 30 days supply | Qty: 30 | Fill #0

## 2020-06-07 NOTE — Telephone Encounter (Signed)
lexapro done 1 mo only to [facility]  Pt due for rov - ok to let him know

## 2020-06-09 ENCOUNTER — Encounter: Payer: Self-pay | Admitting: Family Medicine

## 2020-06-13 ENCOUNTER — Encounter: Payer: Self-pay | Admitting: Internal Medicine

## 2020-06-13 ENCOUNTER — Other Ambulatory Visit: Payer: Self-pay

## 2020-06-13 ENCOUNTER — Encounter: Payer: Self-pay | Admitting: Family Medicine

## 2020-06-13 ENCOUNTER — Ambulatory Visit (INDEPENDENT_AMBULATORY_CARE_PROVIDER_SITE_OTHER): Payer: BC Managed Care – PPO | Admitting: Family Medicine

## 2020-06-13 VITALS — BP 140/80 | HR 66 | Ht 71.0 in | Wt >= 6400 oz

## 2020-06-13 DIAGNOSIS — S39012A Strain of muscle, fascia and tendon of lower back, initial encounter: Secondary | ICD-10-CM | POA: Diagnosis not present

## 2020-06-13 DIAGNOSIS — G4733 Obstructive sleep apnea (adult) (pediatric): Secondary | ICD-10-CM | POA: Diagnosis not present

## 2020-06-13 MED ORDER — TIZANIDINE HCL 4 MG PO TABS: 4.0000 mg | ORAL_TABLET | Freq: Four times a day (QID) | ORAL | 1 refills | Status: DC | PRN

## 2020-06-13 MED FILL — tiZANidine HCL 4 MG TABS: 4 | 7 days supply | Qty: 30 | Fill #0

## 2020-06-13 NOTE — Progress Notes (Signed)
I, Christoper Fabian, LAT, ATC, am serving as scribe for Dr. Clementeen Graham.  Kyle Levy is a 44 y.o. male who presents to [facility] at [facility] today for low back, L hip and L leg pain.  He was last seen by Dr. Katrinka Blazing on 10/06/19 for his L shoulder.  Since his last visit w/ Dr. Katrinka Blazing, pt reports LBP and L hip/leg pain x one week w/ no known MOI.  He reports having a hx of LBP and has been seen in the past at [facility].  He states that he has the majority of his symptoms in his L glute and radiating into his L thigh and lower leg.  He denies any numbness/tingling or weakness in his L LE.  Aggravating factors include prolonged standing and walking.  His symptoms are relieved somewhat w/ forward flexion.  He has been taking IBU, using Pennsaid and doing some of his old home exercises that he had been taught at PT previously.  Diagnostic testing: Pelvis and sacrum/coccyx XR- 10/30/18   Pertinent review of systems: No fevers or chills  Relevant historical information: Morbid obesity.  Is try to lose weight on his own and having trouble.   Exam:  BP 140/80 (BP Location: Right Arm, Patient Position: Sitting, Cuff Size: Large)   Pulse 66   Ht 5\' 11"  (1.803 m)   Wt (!) 400 lb 3.2 oz (181.5 kg)   SpO2 96%   BMI 55.82 kg/m  General: Well Developed, well nourished, and in no acute distress.   MSK: L-spine normal-appearing Nontender midline. Tender palpation left lumbar paraspinal musculature. Decreased lumbar motion.  Lower extremity strength reflexes and sensation are intact.  Left hip normal-appearing Normal motion. Tender palpation greater trochanter mildly.  Hip abduction strength slightly diminished 4+/5.  External rotation strength normal.  Some pain with resisted external rotation and hip abduction strength testing.  X-ray images pelvis obtained October 30, 2018 personally and independently interpreted today. No acute fractures or significant degenerative  changes.   Assessment and Plan: 44 y.o. male with left low back pain and lateral hip/buttocks pain.  Most likely lumbosacral strain and hip abductor tendinopathy.  Plan for physical therapy.  Also use heating pad TENS unit and will prescribe tizanidine.  Additionally patient has chronic difficulties with his morbid obesity.  His obesity does interact and will worsen his back pain and hip pain.  He is tried losing weight on his own.  We discussed some options.  He would like referral to [facility] health medical supervised weight loss clinic.  Provided information for this in referral.  We also discussed possibility of bariatric surgery he would like to avoid this if possible as his father had a bad experience.  Additionally sleep apnea is noted that is worsened by his obesity and is a comorbidity.   PDMP not reviewed this encounter. Orders Placed This Encounter  Procedures  . Ambulatory referral to Physical Therapy    Referral Priority:   Routine    Referral Type:   Physical Medicine    Referral Reason:   Specialty Services Required    Requested Specialty:   Physical Therapy  . Ambulatory referral to Family Practice    Referral Priority:   Routine    Referral Type:   Consultation    Referral Reason:   Specialty Services Required    Referred to Provider:   [facility], MD    Requested Specialty:   [facility]    Number of Visits  Requested:   1   Meds ordered this encounter  Medications  . tiZANidine (ZANAFLEX) 4 MG tablet    Sig: Take 1 tablet (4 mg total) by mouth every 6 (six) hours as needed for muscle spasms.    Dispense:  30 tablet    Refill:  1     Discussed warning signs or symptoms. Please see discharge instructions. Patient expresses understanding.   The above documentation has been reviewed and is accurate and complete Clementeen Graham, M.D.

## 2020-06-13 NOTE — Patient Instructions (Addendum)
Thank you for coming in today.  Get labs today ordered by Dr Jonny Ruiz,  Use heating pad and TENS unit.   Follow up with PT and with weight loss.   [facility] [phone]  [address] [place] [place], [place] [address]  Recheck with me in 6 weeks if not better.  Ok to contact me or return sooner if needed.   TENS UNIT: This is helpful for muscle pain and spasm.   Search and Purchase a TENS 7000 2nd edition at  www.tenspros.com or www.Amazon.com It should be less than $30.     TENS unit instructions: Do not shower or bathe with the unit on . Turn the unit off before removing electrodes or batteries . If the electrodes lose stickiness add a drop of water to the electrodes after they are disconnected from the unit and place on plastic sheet. If you continued to have difficulty, call the TENS unit company to purchase more electrodes. . Do not apply lotion on the skin area prior to use. Make sure the skin is clean and dry as this will help prolong the life of the electrodes. . After use, always check skin for unusual red areas, rash or other skin difficulties. If there are any skin problems, does not apply electrodes to the same area. . Never remove the electrodes from the unit by pulling the wires. . Do not use the TENS unit or electrodes other than as directed. . Do not change electrode placement without consultating your therapist or physician. Marland Kitchen Keep 2 fingers with between each electrode. . Wear time ratio is 2:1, on to off times.    For example on for 30 minutes off for 15 minutes and then on for 30 minutes off for 15 minutes      Lumbosacral Strain Lumbosacral strain is an injury that causes pain in the lower back (lumbosacral spine). This injury usually happens from overstretching the muscles or ligaments along your spine. Ligaments are cord-like tissues that connect bones to other bones. A strain can affect one or more muscles or ligaments. What are the  causes? This condition may be caused by:  A hard, direct hit to the back.  Overstretching the lower back muscles. This may result from: ? A fall. ? Lifting something heavy. ? Repetitive movements such as bending or crouching. What increases the risk? The following factors may make you more likely to develop this condition:  Participating in sports or activities that involve: ? A sudden twist of the back. ? Pushing or pulling motions.  Being overweight or obese.  Having poor strength and flexibility, especially tight hamstrings or weak muscles in the back or abdomen.  Having too much of a curve in the lower back.  Having a pelvis that is tilted forward. What are the signs or symptoms? The main symptom of this condition is pain in the lower back, at the site of the strain. Pain may also be felt down one or both legs. How is this diagnosed? This condition is diagnosed based on your symptoms, your medical history, and a physical exam. During the physical exam, your health care provider may push on certain areas of your back to find the source of your pain. You may be asked to bend forward, backward, and side to side to check your pain and range of motion. You may also have imaging tests, such as X-rays and an MRI. How is this treated? This condition may be treated by:  Applying heat and  cold on the affected area.  Taking medicines to help relieve pain and relax your muscles.  Taking NSAIDs, such as ibuprofen, to help reduce swelling and discomfort.  Doing stretching and strengthening exercises for your lower back. Symptoms usually improve within several weeks of treatment. However, recovery time varies. When your symptoms improve, gradually return to your normal routine as soon as possible to reduce pain, avoid stiffness, and keep muscle strength. Follow these instructions at home: Medicines  Take over-the-counter and prescription medicines only as told by your health care  provider.  Ask your health care provider if the medicine prescribed to you: ? Requires you to avoid driving or using heavy machinery. ? Can cause constipation. You may need to take these actions to prevent or treat constipation:  Drink enough fluid to keep your urine pale yellow.  Take over-the-counter or prescription medicines.  Eat foods that are high in fiber, such as beans, whole grains, and fresh fruits and vegetables.  Limit foods that are high in fat and processed sugars, such as fried or sweet foods. Managing pain, stiffness, and swelling      If directed, put ice on the injured area. To do this: ? Put ice in a plastic bag. ? Place a towel between your skin and the bag. ? Leave the ice on for 20 minutes, 2-3 times a day.  If directed, apply heat on the affected area as often as told by your health care provider. Use the heat source that your health care provider recommends, such as a moist heat pack or a heating pad. ? Place a towel between your skin and the heat source. ? Leave the heat on for 20-30 minutes. ? Remove the heat if your skin turns bright red. This is especially important if you are unable to feel pain, heat, or cold. You may have a greater risk of getting burned. Activity  Rest as told by your health care provider.  Do not stay in bed. Staying in bed for more than 1-2 days can delay your recovery.  Return to your normal activities as told by your health care provider. Ask your health care provider what activities are safe for you.  Avoid activities that take a lot of energy for as long as told by your health care provider.  Do exercises as told by your health care provider. This includes stretching and strengthening exercises. General instructions  Sit up and stand up straight. Avoid leaning forward when you sit, or hunching over when you stand.  Do not use any products that contain nicotine or tobacco, such as cigarettes, e-cigarettes, and chewing  tobacco. If you need help quitting, ask your health care provider.  Keep all follow-up visits as told by your health care provider. This is important. How is this prevented?   Use correct form when playing sports and lifting heavy objects.  Use good posture when sitting and standing.  Maintain a healthy weight.  Sleep on a mattress with medium firmness to support your back.  Do at least 150 minutes of moderate-intensity exercise each week, such as brisk walking or water aerobics. Try a form of exercise that takes stress off your back, such as swimming or stationary cycling.  Maintain physical fitness, including: ? Strength. ? Flexibility. Contact a health care provider if:  Your back pain does not improve after several weeks of treatment.  Your symptoms get worse. Get help right away if:  Your back pain is severe.  You cannot stand or walk.  You have difficulty controlling when you urinate or when you have a bowel movement.  You feel nauseous or you vomit.  Your feet or legs get very cold, turn pale, or look blue.  You have numbness, tingling, weakness, or problems using your arms or legs.  You develop any of the following: ? Shortness of breath. ? Dizziness. ? Pain in your legs. ? Weakness in your buttocks or legs. Summary  Lumbosacral strain is an injury that causes pain in the lower back (lumbosacral spine).  This injury usually happens from overstretching the muscles or ligaments along your spine.  This condition may be caused by a direct hit to the lower back or by overstretching the lower back muscles.  Symptoms usually improve within several weeks of treatment. This information is not intended to replace advice given to you by your health care provider. Make sure you discuss any questions you have with your health care provider. Document Revised: 02/03/2019 Document Reviewed: 02/03/2019 Elsevier Patient Education  2020 [employer].

## 2020-06-20 ENCOUNTER — Telehealth: Payer: Self-pay | Admitting: Family Medicine

## 2020-06-20 NOTE — Addendum Note (Signed)
Addended by: Debbe Odea R on: 06/20/2020 11:43 AM   Modules accepted: Orders

## 2020-06-20 NOTE — Telephone Encounter (Signed)
Ordered and faxed.

## 2020-06-20 NOTE — Telephone Encounter (Signed)
Patient called asking if his PT order could be sent to [facility]. Fax# [phone]

## 2020-06-22 ENCOUNTER — Encounter: Payer: Self-pay | Admitting: Family Medicine

## 2020-06-23 MED ORDER — METHOCARBAMOL 500 MG PO TABS: 500.0000 mg | ORAL_TABLET | Freq: Three times a day (TID) | ORAL | 1 refills | Status: DC

## 2020-06-23 MED FILL — METHOCARBAMOL 500 MG TABS: 500 | 10 days supply | Qty: 30 | Fill #0

## 2020-06-28 DIAGNOSIS — M2560 Stiffness of unspecified joint, not elsewhere classified: Secondary | ICD-10-CM | POA: Diagnosis not present

## 2020-06-28 DIAGNOSIS — R293 Abnormal posture: Secondary | ICD-10-CM | POA: Diagnosis not present

## 2020-06-28 DIAGNOSIS — M5442 Lumbago with sciatica, left side: Secondary | ICD-10-CM | POA: Diagnosis not present

## 2020-06-28 DIAGNOSIS — S39012A Strain of muscle, fascia and tendon of lower back, initial encounter: Secondary | ICD-10-CM | POA: Diagnosis not present

## 2020-06-29 ENCOUNTER — Other Ambulatory Visit: Payer: Self-pay

## 2020-06-29 ENCOUNTER — Ambulatory Visit (INDEPENDENT_AMBULATORY_CARE_PROVIDER_SITE_OTHER): Payer: BC Managed Care – PPO | Admitting: Family Medicine

## 2020-06-29 ENCOUNTER — Encounter (INDEPENDENT_AMBULATORY_CARE_PROVIDER_SITE_OTHER): Payer: Self-pay | Admitting: Family Medicine

## 2020-06-29 VITALS — BP 153/90 | HR 64 | Temp 98.1°F | Ht 71.0 in | Wt 393.0 lb

## 2020-06-29 DIAGNOSIS — R0602 Shortness of breath: Secondary | ICD-10-CM

## 2020-06-29 DIAGNOSIS — G8929 Other chronic pain: Secondary | ICD-10-CM

## 2020-06-29 DIAGNOSIS — Z1331 Encounter for screening for depression: Secondary | ICD-10-CM

## 2020-06-29 DIAGNOSIS — R5383 Other fatigue: Secondary | ICD-10-CM | POA: Diagnosis not present

## 2020-06-29 DIAGNOSIS — E7849 Other hyperlipidemia: Secondary | ICD-10-CM

## 2020-06-29 DIAGNOSIS — R7301 Impaired fasting glucose: Secondary | ICD-10-CM | POA: Diagnosis not present

## 2020-06-29 DIAGNOSIS — Z6841 Body Mass Index (BMI) 40.0 and over, adult: Secondary | ICD-10-CM

## 2020-06-29 DIAGNOSIS — Z0289 Encounter for other administrative examinations: Secondary | ICD-10-CM

## 2020-06-29 DIAGNOSIS — Z9189 Other specified personal risk factors, not elsewhere classified: Secondary | ICD-10-CM | POA: Diagnosis not present

## 2020-06-29 DIAGNOSIS — E65 Localized adiposity: Secondary | ICD-10-CM

## 2020-06-29 DIAGNOSIS — F419 Anxiety disorder, unspecified: Secondary | ICD-10-CM

## 2020-06-29 DIAGNOSIS — Z9989 Dependence on other enabling machines and devices: Secondary | ICD-10-CM

## 2020-06-29 DIAGNOSIS — I10 Essential (primary) hypertension: Secondary | ICD-10-CM | POA: Diagnosis not present

## 2020-06-29 DIAGNOSIS — G4733 Obstructive sleep apnea (adult) (pediatric): Secondary | ICD-10-CM | POA: Diagnosis not present

## 2020-06-30 LAB — CBC WITH DIFFERENTIAL/PLATELET
Basophils Absolute: 0.1 10*3/uL (ref 0.0–0.2)
Basos: 1 %
EOS (ABSOLUTE): 0.2 10*3/uL (ref 0.0–0.4)
Eos: 3 %
Hemoglobin: 14.7 g/dL (ref 13.0–17.7)
Immature Grans (Abs): 0 10*3/uL (ref 0.0–0.1)
Immature Granulocytes: 0 %
Lymphocytes Absolute: 1.7 10*3/uL (ref 0.7–3.1)
Lymphs: 26 %
MCH: 28.6 pg (ref 26.6–33.0)
MCHC: 33.9 g/dL (ref 31.5–35.7)
MCV: 84 fL (ref 79–97)
Monocytes Absolute: 0.4 10*3/uL (ref 0.1–0.9)
Monocytes: 5 %
Neutrophils Absolute: 4.3 10*3/uL (ref 1.4–7.0)
Neutrophils: 65 %
Platelets: 330 10*3/uL (ref 150–450)
RBC: 5.14 x10E6/uL (ref 4.14–5.80)
RDW: 13.7 % (ref 11.6–15.4)
WBC: 6.6 10*3/uL (ref 3.4–10.8)

## 2020-06-30 LAB — COMPREHENSIVE METABOLIC PANEL
ALT: 35 IU/L (ref 0–44)
AST: 26 IU/L (ref 0–40)
Albumin/Globulin Ratio: 1.6 (ref 1.2–2.2)
Albumin: 4.9 g/dL (ref 4.0–5.0)
Alkaline Phosphatase: 103 IU/L (ref 44–121)
BUN/Creatinine Ratio: 16 (ref 9–20)
BUN: 13 mg/dL (ref 6–24)
Bilirubin Total: 0.6 mg/dL (ref 0.0–1.2)
CO2: 22 mmol/L (ref 20–29)
Calcium: 9.5 mg/dL (ref 8.7–10.2)
Chloride: 99 mmol/L (ref 96–106)
Creatinine, Ser: 0.8 mg/dL (ref 0.76–1.27)
GFR calc Af Amer: 126 mL/min/{1.73_m2} (ref >59)
GFR calc non Af Amer: 109 mL/min/{1.73_m2} (ref >59)
Globulin, Total: 3.1 g/dL (ref 1.5–4.5)
Glucose: 86 mg/dL (ref 65–99)
Potassium: 4.1 mmol/L (ref 3.5–5.2)
Sodium: 138 mmol/L (ref 134–144)
Total Protein: 8 g/dL (ref 6.0–8.5)

## 2020-06-30 LAB — INSULIN, RANDOM: INSULIN: 34.2 u[IU]/mL — ABNORMAL HIGH (ref 2.6–24.9)

## 2020-06-30 LAB — ANEMIA PANEL
Ferritin: 132 ng/mL (ref 30–400)
Folate, Hemolysate: 530 ng/mL
Folate, RBC: 1224 ng/mL (ref >498)
Hematocrit: 43.3 % (ref 37.5–51.0)
Iron Saturation: 28 % (ref 15–55)
Iron: 83 ug/dL (ref 38–169)
Retic Ct Pct: 1.6 % (ref 0.6–2.6)
Total Iron Binding Capacity: 293 ug/dL (ref 250–450)
UIBC: 210 ug/dL (ref 111–343)
Vitamin B-12: 449 pg/mL (ref 232–1245)

## 2020-06-30 LAB — HEMOGLOBIN A1C
Est. average glucose Bld gHb Est-mCnc: 123 mg/dL
Hgb A1c MFr Bld: 5.9 % — ABNORMAL HIGH (ref 4.8–5.6)

## 2020-06-30 LAB — VITAMIN D 25 HYDROXY (VIT D DEFICIENCY, FRACTURES): Vit D, 25-Hydroxy: 31.9 ng/mL (ref 30.0–100.0)

## 2020-06-30 LAB — LIPID PANEL WITH LDL/HDL RATIO
Cholesterol, Total: 183 mg/dL (ref 100–199)
HDL: 32 mg/dL — ABNORMAL LOW (ref >39)
LDL Chol Calc (NIH): 114 mg/dL — ABNORMAL HIGH (ref 0–99)
LDL/HDL Ratio: 3.6 ratio (ref 0.0–3.6)
Triglycerides: 212 mg/dL — ABNORMAL HIGH (ref 0–149)
VLDL Cholesterol Cal: 37 mg/dL (ref 5–40)

## 2020-06-30 LAB — TSH: TSH: 1.81 u[IU]/mL (ref 0.450–4.500)

## 2020-06-30 LAB — T4, FREE: Free T4: 0.95 ng/dL (ref 0.82–1.77)

## 2020-06-30 NOTE — Progress Notes (Signed)
Dear Dr. Denyse Levy,   Thank you for referring Kyle Levy to our clinic. The following note includes my evaluation and treatment recommendations.  Chief Complaint:   OBESITY Kyle Levy (MR# [id number]) is a 44 y.o. male who presents for evaluation and treatment of obesity and related comorbidities. Current BMI is Body mass index is 54.81 kg/m. Kyle Levy has been struggling with his weight for many years and has been unsuccessful in either losing weight, maintaining weight loss, or reaching his healthy weight goal.  Kyle Levy is currently in the action stage of change and ready to dedicate time achieving and maintaining a healthier weight. Kyle Levy is interested in becoming our patient and working on intensive lifestyle modifications including (but not limited to) diet and exercise for weight loss.  Kyle Levy's habits were reviewed today and are as follows: His family eats meals together, he thinks his family will eat healthier with him, he struggles with family and or coworkers weight loss sabotage, his desired weight loss is 100+ pounds, he has been heavy most of his life, he started gaining weight after college, his heaviest weight ever was 400 pounds, he craves sweets at night, he snacks frequently in the evenings, he is frequently drinking liquids with calories, he frequently makes poor food choices, he frequently eats larger portions than normal and he struggles with emotional eating.  Depression Screen Kyle Levy's Food and Mood (modified PHQ-9) score was 10.  Depression screen PHQ 2/9 06/29/2020  Decreased Interest 1  Down, Depressed, Hopeless 2  PHQ - 2 Score 3  Altered sleeping 1  Tired, decreased energy 1  Change in appetite 2  Feeling bad or failure about yourself  2  Trouble concentrating 1  Moving slowly or fidgety/restless 0  Suicidal thoughts 0  PHQ-9 Score 10  Difficult doing work/chores Somewhat difficult   Assessment/Plan:   1. Other fatigue Kyle Levy admits to daytime somnolence  and denies waking up still tired. Patent has a history of symptoms of daytime fatigue and snoring. Kyle Levy generally gets 6 hours of sleep per night, and states that he has generally restful sleep. Snoring is present. Apneic episodes are present. Epworth Sleepiness Score is 11.  Kyle Levy does not feel that his weight is causing his energy to be lower than it should be. Fatigue may be related to obesity, depression or many other causes. Labs will be ordered, and in the meanwhile, Kyle Levy will focus on self care including making healthy food choices, increasing physical activity and focusing on stress reduction.  - EKG 12-Lead - Anemia panel - Hemoglobin A1c - Insulin, random - T4, free - TSH - VITAMIN D 25 Hydroxy (Vit-D Deficiency, Fractures)  2. SOB (shortness of breath) on exertion Kyle Levy notes increasing shortness of breath with exercising and seems to be worsening over time with weight gain. He notes getting out of breath sooner with activity than he used to. This has gotten worse recently. Kyle Levy denies shortness of breath at rest or orthopnea.  Kyle Levy does feel that he gets out of breath more easily that he used to when he exercises. Kyle Levy's shortness of breath appears to be obesity related and exercise induced. He has agreed to work on weight loss and gradually increase exercise to treat his exercise induced shortness of breath. Will continue to monitor closely.  - EKG 12-Lead  3. OSA on CPAP Kyle Levy has a diagnosis of sleep apnea. He reports that he is using a CPAP regularly.   Goal: Treatment of OSA via CPAP compliance and weight  loss. . Plasma ghrelin levels (appetite or "hunger hormone") are significantly higher in OSA patients than in BMI-matched controls, but decrease to levels similar to those of obese patients without OSA after CPAP treatment.  . Weight loss improves OSA by several mechanisms, including reduction in fatty tissue in the throat (i.e. parapharyngeal fat) and the tongue. Loss  of abdominal fat increases mediastinal traction on the upper airway making it less likely to collapse during sleep. . Studies have also shown that compliance with CPAP treatment improves leptin (hunger inhibitory hormone) imbalance.  4. Essential hypertension Elevated today. He is taking Norvasc 10 mg daily, and Cozaar 100 mg daily. Plan: Monitor home BP. Diet: Avoid buying foods that are: processed, frozen, or prepackaged to avoid excess salt. Recheck BP at next visit. The patient understands monitoring parameters and red flags.    BP Readings from Last 3 Encounters:  06/29/20 (!) 153/90  06/13/20 140/80  10/06/19 118/90   - Anemia panel - CBC with Differential/Platelet - Comprehensive metabolic panel - Lipid Panel With LDL/HDL Ratio - T4, free - TSH  5. Other hyperlipidemia Cardiovascular risk and specific lipid/LDL goals reviewed.  We discussed several lifestyle modifications today and [name] will continue to work on diet, exercise and weight loss efforts. He takes Lipitor 20 mg daily.   Lab Results  Component Value Date   ALT 35 06/29/2020   AST 26 06/29/2020   ALKPHOS 103 06/29/2020   BILITOT 0.6 06/29/2020   Lab Results  Component Value Date   CHOL 183 06/29/2020   HDL 32 (L) 06/29/2020   LDLCALC 114 (H) 06/29/2020   LDLDIRECT 85.0 04/10/2016   TRIG 212 (H) 06/29/2020   CHOLHDL 4 05/28/2018   - Lipid Panel With LDL/HDL Ratio  6. Other chronic pain We will continue to monitor symptoms as they relate to his weight loss journey. This issue directly impacts care plan for optimization of BMI and metabolic health as it impacts the patient's ability to make lifestyle changes.  7. Visceral obesity Strong family history of heart disease.  Current visceral fat rating: 36. Visceral adipose tissue is a hormonally active component of total body fat. This body composition phenotype is associated with medical disorders such as metabolic syndrome, cardiovascular disease and several  malignancies including prostate, breast, and colorectal cancers. Goal: Lose 7-10% of starting weight. Visceral fat rating should be < 13.  8. Anxiety He is taking Buspar 10 mg three times daily as needed and Lexapro 20 mg daily.  Behavior modification techniques were discussed today to help Kyle Levy deal with his anxiety.    9. Depression screening Kyle Levy was screened for depression today as part of his new patient workup.  PHQ-9 is 10.  Kyle Levy had a positive depression screening. Depression is commonly associated with obesity and often results in emotional eating behaviors. We will monitor this closely and work on CBT to help improve the non-hunger eating patterns. Referral to Psychology may be required if no improvement is seen as he continues in our clinic.  10. At risk for heart disease Kyle Levy was given approximately 15 minutes of coronary artery disease prevention counseling today. He is 44 y.o. male and has risk factors for heart disease including obesity. We discussed intensive lifestyle modifications today with an emphasis on specific weight loss instructions and strategies.   11. Class 3 severe obesity with serious comorbidity and body mass index (BMI) of 50.0 to 59.9 in adult, unspecified obesity type (HCC)  Kyle Levy is currently in the action stage of  change and his goal is to continue with weight loss efforts. I recommend Kyle Levy begin the structured treatment plan as follows:  He has agreed to the Category 4 Plan +1000 calories (increase protein at breakfast/lunch).  Okay avocado, full calorie bread, hummus.  Exercise goals: No exercise has been prescribed at this time.   Behavioral modification strategies: increasing lean protein intake, decreasing simple carbohydrates, increasing vegetables and increasing water intake.  He was informed of the importance of frequent follow-up visits to maximize his success with intensive lifestyle modifications for his multiple health conditions. He was  informed we would discuss his lab results at his next visit unless there is a critical issue that needs to be addressed sooner. Kyle Levy agreed to keep his next visit at the agreed upon time to discuss these results.  Objective:   Blood pressure (!) 153/90, pulse 64, temperature 98.1 F (36.7 C), temperature source Oral, height 5\' 11"  (1.803 m), weight (!) 393 lb (178.3 kg), SpO2 97 %. Body mass index is 54.81 kg/m.  EKG: Normal sinus rhythm, rate 66 bpm.  Indirect Calorimeter completed today shows a VO2 of 548 and a REE of 3816.  His calculated basal metabolic rate is thus his basal metabolic rate is better than expected.  General: Cooperative, alert, well developed, in no acute distress. HEENT: Conjunctivae and lids unremarkable. Cardiovascular: Regular rhythm.  Lungs: Normal work of breathing. Neurologic: No focal deficits.   Lab Results  Component Value Date   CREATININE 0.80 06/29/2020   BUN 13 06/29/2020   NA 138 06/29/2020   K 4.1 06/29/2020   CL 99 06/29/2020   CO2 22 06/29/2020   Lab Results  Component Value Date   ALT 35 06/29/2020   AST 26 06/29/2020   ALKPHOS 103 06/29/2020   BILITOT 0.6 06/29/2020   Lab Results  Component Value Date   HGBA1C 5.9 (H) 06/29/2020   Lab Results  Component Value Date   INSULIN WILL FOLLOW 06/29/2020   Lab Results  Component Value Date   TSH 1.810 06/29/2020   Lab Results  Component Value Date   CHOL 183 06/29/2020   HDL 32 (L) 06/29/2020   LDLCALC 114 (H) 06/29/2020   LDLDIRECT 85.0 04/10/2016   TRIG 212 (H) 06/29/2020   CHOLHDL 4 05/28/2018   Lab Results  Component Value Date   WBC 6.6 06/29/2020   HGB 14.7 06/29/2020   HCT 43.3 06/29/2020   MCV 84 06/29/2020   PLT 330 06/29/2020   Lab Results  Component Value Date   IRON 83 06/29/2020   TIBC 293 06/29/2020   FERRITIN 132 06/29/2020   Attestation Statements:   Reviewed by clinician on day of visit: allergies, medications, problem list, medical history,  surgical history, family history, social history, and previous encounter notes.  I, 08/29/2020, CMA, am acting as transcriptionist for Insurance claims handler, DO  I have reviewed the above documentation for accuracy and completeness, and I agree with the above. Helane Rima, DO

## 2020-07-04 DIAGNOSIS — S39012A Strain of muscle, fascia and tendon of lower back, initial encounter: Secondary | ICD-10-CM | POA: Diagnosis not present

## 2020-07-12 ENCOUNTER — Other Ambulatory Visit: Payer: Self-pay

## 2020-07-12 ENCOUNTER — Other Ambulatory Visit: Payer: Self-pay | Admitting: Internal Medicine

## 2020-07-12 ENCOUNTER — Encounter: Payer: Self-pay | Admitting: Internal Medicine

## 2020-07-12 ENCOUNTER — Ambulatory Visit (INDEPENDENT_AMBULATORY_CARE_PROVIDER_SITE_OTHER): Payer: BC Managed Care – PPO | Admitting: Internal Medicine

## 2020-07-12 VITALS — BP 140/86 | HR 67 | Temp 98.6°F | Ht 71.0 in | Wt 396.0 lb

## 2020-07-12 DIAGNOSIS — Z23 Encounter for immunization: Secondary | ICD-10-CM

## 2020-07-12 DIAGNOSIS — E782 Mixed hyperlipidemia: Secondary | ICD-10-CM

## 2020-07-12 DIAGNOSIS — Z0000 Encounter for general adult medical examination without abnormal findings: Secondary | ICD-10-CM

## 2020-07-12 DIAGNOSIS — R7303 Prediabetes: Secondary | ICD-10-CM | POA: Diagnosis not present

## 2020-07-12 DIAGNOSIS — I10 Essential (primary) hypertension: Secondary | ICD-10-CM | POA: Diagnosis not present

## 2020-07-12 DIAGNOSIS — Z1159 Encounter for screening for other viral diseases: Secondary | ICD-10-CM

## 2020-07-12 DIAGNOSIS — E559 Vitamin D deficiency, unspecified: Secondary | ICD-10-CM

## 2020-07-12 MED ORDER — ATORVASTATIN CALCIUM 20 MG PO TABS
ORAL_TABLET | ORAL | 3 refills | Status: DC
Start: 2020-07-12 — End: 2020-09-01

## 2020-07-12 MED ORDER — BUSPIRONE HCL 10 MG PO TABS
10.0000 mg | ORAL_TABLET | Freq: Three times a day (TID) | ORAL | 3 refills | Status: DC | PRN
Start: 2020-07-12 — End: 2021-07-14

## 2020-07-12 MED ORDER — ESCITALOPRAM OXALATE 20 MG PO TABS
20.0000 mg | ORAL_TABLET | Freq: Every day | ORAL | 3 refills | Status: DC
Start: 2020-07-12 — End: 2020-07-20

## 2020-07-12 MED ORDER — AMLODIPINE BESYLATE 10 MG PO TABS
ORAL_TABLET | ORAL | 3 refills | Status: DC
Start: 2020-07-12 — End: 2020-09-01

## 2020-07-12 MED ORDER — LOSARTAN POTASSIUM 100 MG PO TABS
ORAL_TABLET | ORAL | 3 refills | Status: DC
Start: 2020-07-12 — End: 2020-09-01

## 2020-07-12 MED FILL — ESCITALOPRAM 20 MG TABLET: 20 | 90 days supply | Qty: 90 | Fill #0

## 2020-07-12 MED FILL — busPIRone HCL 10 MG TABS: 10 | 30 days supply | Qty: 90 | Fill #0

## 2020-07-12 NOTE — Progress Notes (Signed)
Subjective:    Patient ID: Kyle Levy, male    DOB: 1975-10-12, 44 y.o.   MRN: [id number]  HPI  Here for wellness and f/u;  Overall doing ok;  Pt denies Chest pain, worsening SOB, DOE, wheezing, orthopnea, PND, worsening LE edema, palpitations, dizziness or syncope.  Pt denies neurological change such as new headache, facial or extremity weakness.  Pt denies polydipsia, polyuria, or low sugar symptoms. Pt states overall good compliance with treatment and medications, good tolerability, and has been trying to follow appropriate diet.  Pt denies worsening depressive symptoms, suicidal ideation or panic. No fever, night sweats, wt loss, loss of appetite, or other constitutional symptoms.  Pt states good ability with ADL's, has low fall risk, home safety reviewed and adequate, no other significant changes in hearing or vision, and only occasionally active with exercise.  No new complaints Past Medical History:  Diagnosis Date  . Anxiety 08/27/2011  . Back pain   . HTN (hypertension) 08/27/2011  . Hyperlipidemia 08/27/2011  . Hypertension   . Morbid obesity (HCC) 09/14/2015  . OSA on CPAP   . Psoriasis 09/14/2015   Past Surgical History:  Procedure Laterality Date  . KNEE ARTHROSCOPY     bilateral knee    reports that he has never smoked. He has never used smokeless tobacco. He reports current alcohol use. He reports that he does not use drugs. family history includes Alcoholism in his father; Diabetes in his father; Eating disorder in his father; Heart disease in his father; Hyperlipidemia in his father; Hypertension in his father; Obesity in his father; Sleep apnea in his father and mother; Stroke in his father. No Known Allergies Current Outpatient Medications on File Prior to Visit  Medication Sig Dispense Refill  . Diclofenac Sodium (PENNSAID) 2 % SOLN Place 2 g onto the skin 2 (two) times daily. 112 g 3  . levocetirizine (XYZAL) 5 MG tablet Take 1 tablet (5 mg total) by mouth every  evening. 30 tablet 3  . methocarbamol (ROBAXIN) 500 MG tablet Take 1 tablet (500 mg total) by mouth 3 (three) times daily. 30 tablet 1  . Multiple Vitamin (MULTIVITAMIN) tablet Take 1 tablet by mouth daily.      . Omega-3 Fatty Acids (FISH OIL PO) Take by mouth daily.     No current facility-administered medications on file prior to visit.   Review of Systems All otherwise neg per pt     Objective:   Physical Exam BP 140/86 (BP Location: Left Arm, Patient Position: Sitting, Cuff Size: Large)   Pulse 67   Temp 98.6 F (37 C) (Oral)   Ht 5\' 11"  (1.803 m)   Wt (!) 396 lb (179.6 kg)   SpO2 97%   BMI 55.23 kg/m  VS noted,  Constitutional: Pt appears in NAD HENT: Head: NCAT.  Right Ear: External ear normal.  Left Ear: External ear normal.  Eyes: . Pupils are equal, round, and reactive to light. Conjunctivae and EOM are normal Nose: without d/c or deformity Neck: Neck supple. Gross normal ROM Cardiovascular: Normal rate and regular rhythm.   Pulmonary/Chest: Effort normal and breath sounds without rales or wheezing.  Abd:  Soft, NT, ND, + BS, no organomegaly Neurological: Pt is alert. At baseline orientation, motor grossly intact Skin: Skin is warm. No rashes, other new lesions, no LE edema Psychiatric: Pt behavior is normal without agitation  All otherwise neg per pt Lab Results  Component Value Date   WBC 6.6 06/29/2020   HGB  14.7 06/29/2020   HCT 43.3 06/29/2020   PLT 330 06/29/2020   GLUCOSE 86 06/29/2020   CHOL 183 06/29/2020   TRIG 212 (H) 06/29/2020   HDL 32 (L) 06/29/2020   LDLDIRECT 85.0 04/10/2016   LDLCALC 114 (H) 06/29/2020   ALT 35 06/29/2020   AST 26 06/29/2020   NA 138 06/29/2020   K 4.1 06/29/2020   CL 99 06/29/2020   CREATININE 0.80 06/29/2020   BUN 13 06/29/2020   CO2 22 06/29/2020   TSH 1.810 06/29/2020   PSA 0.58 05/28/2018   HGBA1C 5.9 (H) 06/29/2020      Assessment & Plan:

## 2020-07-12 NOTE — Patient Instructions (Addendum)
You had the flu shot today  Please continue all other medications as before, and refills have been done if requested.  Please have the pharmacy call with any other refills you may need.  Please continue your efforts at being more active, low cholesterol diet, and weight control.  You are otherwise up to date with prevention measures today.  Please keep your appointments with your specialists as you may have planned  Please make an Appointment to return for your 1 year visit, or sooner if needed, with Lab testing by Appointment as well, to be done about 3-5 days before at the FIRST FLOOR Lab (so this is for TWO appointments - please see the scheduling desk as you leave)  

## 2020-07-13 ENCOUNTER — Encounter (INDEPENDENT_AMBULATORY_CARE_PROVIDER_SITE_OTHER): Payer: Self-pay | Admitting: Family Medicine

## 2020-07-13 ENCOUNTER — Ambulatory Visit (INDEPENDENT_AMBULATORY_CARE_PROVIDER_SITE_OTHER): Payer: BC Managed Care – PPO | Admitting: Family Medicine

## 2020-07-13 VITALS — BP 166/93 | HR 70 | Temp 98.1°F | Ht 71.0 in | Wt 393.0 lb

## 2020-07-13 DIAGNOSIS — Z9189 Other specified personal risk factors, not elsewhere classified: Secondary | ICD-10-CM

## 2020-07-13 DIAGNOSIS — S39012A Strain of muscle, fascia and tendon of lower back, initial encounter: Secondary | ICD-10-CM | POA: Diagnosis not present

## 2020-07-13 DIAGNOSIS — E782 Mixed hyperlipidemia: Secondary | ICD-10-CM | POA: Diagnosis not present

## 2020-07-13 DIAGNOSIS — R7303 Prediabetes: Secondary | ICD-10-CM

## 2020-07-13 DIAGNOSIS — I10 Essential (primary) hypertension: Secondary | ICD-10-CM

## 2020-07-13 DIAGNOSIS — Z6841 Body Mass Index (BMI) 40.0 and over, adult: Secondary | ICD-10-CM

## 2020-07-13 NOTE — Progress Notes (Signed)
Chief Complaint:   OBESITY Kyle Levy is here to discuss his progress with his obesity treatment plan along with follow-up of his obesity related diagnoses.   Today's visit was #: 2 Starting weight: 393 lbs Starting date: 06/29/2020 Today's weight: 393 lbs Today's date: 07/13/2020 Total lbs lost to date: 0 Body mass index is 54.81 kg/m.   Interim History: RMR 3716.  Bioimpedence shows fat is down 6 pounds and muscle is up 5 pounds.    Nutrition Plan: Kyle Levy has been tracking in [facility]. Calories:  2800, protein 125 grams, carbohydrates 173 mg, fiber 14 grams (may not be logging all food).   Hunger is moderately controlled controlled. Cravings are moderately controlled controlled.  Activity: None. Sleep is restful.   Assessment/Plan:   1. Prediabetes New.  Discussed labs with patient today.  Discussed metformin and Saxenda.  He would like to hold on starting another medication.    He will continue to focus on protein-rich, low simple carbohydrate foods. We reviewed the importance of hydration, regular exercise for stress reduction, and restorative sleep.  Goal is HgbA1c < 5.7 and insulin level closer to 5.   Lab Results  Component Value Date   HGBA1C 5.9 (H) 06/29/2020   Lab Results  Component Value Date   INSULIN 34.2 (H) 06/29/2020   2. Mixed hyperlipidemia Discussed labs with patient today.  He denies any chest pain, claudication or myalgias.  He is taking Lipitor 20 mg daily.The current medical regimen is effective;  continue present plan and medications.  Lab Results  Component Value Date   ALT 35 06/29/2020   AST 26 06/29/2020   ALKPHOS 103 06/29/2020   BILITOT 0.6 06/29/2020   Lab Results  Component Value Date   CHOL 183 06/29/2020   HDL 32 (L) 06/29/2020   LDLCALC 114 (H) 06/29/2020   LDLDIRECT 85.0 04/10/2016   TRIG 212 (H) 06/29/2020   CHOLHDL 4 05/28/2018   3. Essential hypertension Discussed labs with patient today.  Elevated. Medications: Norvasc 10 mg  daily, Cozaar 100 mg daily. Plan: Monitor home BP. Diet: Avoid buying foods that are: processed, frozen, or prepackaged to avoid excess salt. Recheck BP at next visit. The patient understands monitoring parameters and red flags.   BP Readings from Last 3 Encounters:  07/13/20 (!) 166/93  07/12/20 140/86  06/29/20 (!) 153/90   Lab Results  Component Value Date   CREATININE 0.80 06/29/2020   4. At risk for diabetes mellitus Kyle Levy was given approximately 15 minutes of diabetes education and counseling today. We discussed intensive lifestyle modifications today with an emphasis on weight loss as well as increasing exercise and decreasing simple carbohydrates in his diet. We also reviewed medication options with an emphasis on risk versus benefit of those discussed.   5. Class 3 severe obesity with serious comorbidity and body mass index (BMI) of 50.0 to 59.9 in adult, unspecified obesity type (HCC)  Kyle Levy is currently in the action stage of change. As such, his goal is to continue with weight loss efforts.   Nutrition goals: He has agreed to the Category 4 Plan.  Watch for hidden calories.   Exercise goals: Walk/bike for 30 mintues 3 times per week.  PT once weekly.  Behavioral modification strategies: decreasing simple carbohydrates.  Kyle Levy has agreed to follow-up with our clinic in 2-3 weeks. He was informed of the importance of frequent follow-up visits to maximize his success with intensive lifestyle modifications for his multiple health conditions.   Objective:  Blood pressure (!) 166/93, pulse 70, temperature 98.1 F (36.7 C), temperature source Oral, height 5\' 11"  (1.803 m), weight (!) 393 lb (178.3 kg), SpO2 97 %. Body mass index is 54.81 kg/m.  General: Cooperative, alert, well developed, in no acute distress. HEENT: Conjunctivae and lids unremarkable. Cardiovascular: Regular rhythm.  Lungs: Normal work of breathing. Neurologic: No focal deficits.   Lab Results    Component Value Date   CREATININE 0.80 06/29/2020   BUN 13 06/29/2020   NA 138 06/29/2020   K 4.1 06/29/2020   CL 99 06/29/2020   CO2 22 06/29/2020   Lab Results  Component Value Date   ALT 35 06/29/2020   AST 26 06/29/2020   ALKPHOS 103 06/29/2020   BILITOT 0.6 06/29/2020   Lab Results  Component Value Date   HGBA1C 5.9 (H) 06/29/2020   Lab Results  Component Value Date   INSULIN 34.2 (H) 06/29/2020   Lab Results  Component Value Date   TSH 1.810 06/29/2020   Lab Results  Component Value Date   CHOL 183 06/29/2020   HDL 32 (L) 06/29/2020   LDLCALC 114 (H) 06/29/2020   LDLDIRECT 85.0 04/10/2016   TRIG 212 (H) 06/29/2020   CHOLHDL 4 05/28/2018   Lab Results  Component Value Date   WBC 6.6 06/29/2020   HGB 14.7 06/29/2020   HCT 43.3 06/29/2020   MCV 84 06/29/2020   PLT 330 06/29/2020   Lab Results  Component Value Date   IRON 83 06/29/2020   TIBC 293 06/29/2020   FERRITIN 132 06/29/2020   Attestation Statements:   Reviewed by clinician on day of visit: allergies, medications, problem list, medical history, surgical history, family history, social history, and previous encounter notes.  I, 08/29/2020, CMA, am acting as transcriptionist for Insurance claims handler, DO  I have reviewed the above documentation for accuracy and completeness, and I agree with the above. Helane Rima, DO

## 2020-07-17 ENCOUNTER — Encounter: Payer: Self-pay | Admitting: Internal Medicine

## 2020-07-17 DIAGNOSIS — E559 Vitamin D deficiency, unspecified: Secondary | ICD-10-CM | POA: Insufficient documentation

## 2020-07-17 NOTE — Assessment & Plan Note (Signed)

## 2020-07-17 NOTE — Assessment & Plan Note (Signed)
stable overall by history and exam, recent data reviewed with pt, and pt to continue medical treatment as before,  to f/u any worsening symptoms or concerns  

## 2020-07-17 NOTE — Assessment & Plan Note (Signed)
Lab Results  Component Value Date   LDLCALC 114 (H) 06/29/2020  for lower chol diet

## 2020-07-17 NOTE — Assessment & Plan Note (Signed)
For oral replacement 

## 2020-07-18 DIAGNOSIS — S39012A Strain of muscle, fascia and tendon of lower back, initial encounter: Secondary | ICD-10-CM | POA: Diagnosis not present

## 2020-07-20 ENCOUNTER — Other Ambulatory Visit: Payer: Self-pay | Admitting: Internal Medicine

## 2020-07-25 ENCOUNTER — Ambulatory Visit: Payer: BC Managed Care – PPO | Admitting: Family Medicine

## 2020-07-25 DIAGNOSIS — S39012A Strain of muscle, fascia and tendon of lower back, initial encounter: Secondary | ICD-10-CM | POA: Diagnosis not present

## 2020-07-27 ENCOUNTER — Encounter (INDEPENDENT_AMBULATORY_CARE_PROVIDER_SITE_OTHER): Payer: Self-pay | Admitting: Adult Health

## 2020-07-27 ENCOUNTER — Ambulatory Visit (INDEPENDENT_AMBULATORY_CARE_PROVIDER_SITE_OTHER): Payer: BC Managed Care – PPO | Admitting: Adult Health

## 2020-07-27 ENCOUNTER — Other Ambulatory Visit: Payer: Self-pay

## 2020-07-27 VITALS — BP 145/87 | HR 73 | Temp 98.4°F | Ht 71.0 in | Wt 388.0 lb

## 2020-07-27 DIAGNOSIS — R7303 Prediabetes: Secondary | ICD-10-CM

## 2020-07-27 DIAGNOSIS — I10 Essential (primary) hypertension: Secondary | ICD-10-CM | POA: Diagnosis not present

## 2020-07-27 DIAGNOSIS — Z6841 Body Mass Index (BMI) 40.0 and over, adult: Secondary | ICD-10-CM

## 2020-07-27 DIAGNOSIS — E559 Vitamin D deficiency, unspecified: Secondary | ICD-10-CM

## 2020-07-27 NOTE — Progress Notes (Signed)
Chief Complaint:   OBESITY Kyle Levy is here to discuss his progress with his obesity treatment plan along with follow-up of his obesity related diagnoses. Kyle Levy is on the Category 4 Plan and states he is following his eating plan approximately 90% of the time. Kyle Levy states he is walking 20-30 minutes 3 times per week.  Today's visit was #: 3 Starting weight: 393 lbs Starting date: 06/29/2020 Today's weight: 388 lbs Today's date: 07/28/2020 Total lbs lost to date: 5 Total lbs lost since last in-office visit: 5  Interim History: Kyle Levy is seeing PT biweekly to treat lumbosacral strain. Pain is lessening and he has been able to walk without difficulty. He has really focused on protein at each meal and remaining well hydrated with water.  Subjective:   Essential hypertension. Systolic blood pressure is slightly elevated at today's office visit. Kyle Levy is on losartan 100 mg and amlodipine 10 mg daily- he takes these medications each morning and reports taking both doses today.  He denies tobacco/vape use.   BP Readings from Last 3 Encounters:  07/27/20 (!) 145/87  07/13/20 (!) 166/93  07/12/20 140/86   Lab Results  Component Value Date   CREATININE 0.80 06/29/2020   CREATININE 0.92 05/28/2018   CREATININE 0.95 05/06/2017   Prediabetes.  Kyle Levy has a diagnosis of prediabetes based on his elevated HgA1c and was informed this puts him at greater risk of developing diabetes. He continues to work on diet and exercise to decrease his risk of diabetes. He denies nausea or hypoglycemia. 06/29/2020 blood glucose 86, A1c 5.9 with an insulin level of 34.2. Metformin and GLP have been discussed at previous office visits, but he would like to normalize A1c with lifestyle modifications.  Lab Results  Component Value Date   HGBA1C 5.9 (H) 06/29/2020   Lab Results  Component Value Date   INSULIN 34.2 (H) 06/29/2020   Vitamin D deficiency. Vitamin D level on 06/29/2020 was 31.9, below  goal of 50. Kyle Levy is on OTC Vitamin D supplementation, unsure of dosage.   Ref. Range 06/29/2020 11:43  Vitamin D, 25-Hydroxy Latest Ref Range: 30.0 - 100.0 ng/mL 31.9   Assessment/Plan:   Essential hypertension. Kyle Levy is working on healthy weight loss and exercise to improve blood pressure control. We will watch for signs of hypotension as he continues his lifestyle modifications. He will continue his current antihypertensive therapy as directed.   Prediabetes. Kyle Levy will continue to work on weight loss, exercise, and decreasing simple carbohydrates to help decrease the risk of diabetes. He will continue to follow the Category 4 meal plan.  Vitamin D deficiency. Low Vitamin D level contributes to fatigue and are associated with obesity, breast, and colon cancer. He was instructed to provide dosage of OTC Vitamin D3 via My Chart.  Class 3 severe obesity with serious comorbidity and body mass index (BMI) of 50.0 to 59.9 in adult, unspecified obesity type (HCC).  Kyle Levy is currently in the action stage of change. As such, his goal is to continue with weight loss efforts. He has agreed to the Category 4 Plan.    Handout was provided on Additional Breakfast Options.  Exercise goals: Kyle Levy will continue walking 20-30 minutes 3 times per week and PT biweekly.   Behavioral modification strategies: increasing lean protein intake, decreasing simple carbohydrates, meal planning and cooking strategies and planning for success.  [name] has agreed to follow-up with our clinic in 2 weeks. He was informed of the importance of frequent follow-up visits to  maximize his success with intensive lifestyle modifications for his multiple health conditions.   Objective:   Blood pressure (!) 145/87, pulse 73, temperature 98.4 F (36.9 C), height 5\' 11"  (1.803 m), weight (!) 388 lb (176 kg), SpO2 99 %. Body mass index is 54.12 kg/m.  General: Cooperative, alert, well developed, in no acute distress. HEENT:  Conjunctivae and lids unremarkable. Cardiovascular: Regular rhythm.  Lungs: Normal work of breathing. Neurologic: No focal deficits.   Lab Results  Component Value Date   CREATININE 0.80 06/29/2020   BUN 13 06/29/2020   NA 138 06/29/2020   K 4.1 06/29/2020   CL 99 06/29/2020   CO2 22 06/29/2020   Lab Results  Component Value Date   ALT 35 06/29/2020   AST 26 06/29/2020   ALKPHOS 103 06/29/2020   BILITOT 0.6 06/29/2020   Lab Results  Component Value Date   HGBA1C 5.9 (H) 06/29/2020   Lab Results  Component Value Date   INSULIN 34.2 (H) 06/29/2020   Lab Results  Component Value Date   TSH 1.810 06/29/2020   Lab Results  Component Value Date   CHOL 183 06/29/2020   HDL 32 (L) 06/29/2020   LDLCALC 114 (H) 06/29/2020   LDLDIRECT 85.0 04/10/2016   TRIG 212 (H) 06/29/2020   CHOLHDL 4 05/28/2018   Lab Results  Component Value Date   WBC 6.6 06/29/2020   HGB 14.7 06/29/2020   HCT 43.3 06/29/2020   MCV 84 06/29/2020   PLT 330 06/29/2020   Lab Results  Component Value Date   IRON 83 06/29/2020   TIBC 293 06/29/2020   FERRITIN 132 06/29/2020   Attestation Statements:   Reviewed by clinician on day of visit: allergies, medications, problem list, medical history, surgical history, family history, social history, and previous encounter notes.  Time spent on visit including pre-visit chart review and post-visit charting and care was 28 minutes.   I, 08/29/2020, am acting as [place] [employer] for Energy manager, NP-C   I have reviewed the above documentation for accuracy and completeness, and I agree with the above. -  Alp Goldwater d. Kelsea Mousel, NP-C

## 2020-07-28 ENCOUNTER — Other Ambulatory Visit (INDEPENDENT_AMBULATORY_CARE_PROVIDER_SITE_OTHER): Payer: Self-pay

## 2020-07-28 NOTE — Telephone Encounter (Signed)
FYI- I updated his med list.

## 2020-07-28 NOTE — Telephone Encounter (Signed)
Kyle Levy pt 

## 2020-08-02 ENCOUNTER — Ambulatory Visit: Payer: BC Managed Care – PPO | Attending: Internal Medicine

## 2020-08-02 DIAGNOSIS — Z23 Encounter for immunization: Secondary | ICD-10-CM

## 2020-08-02 NOTE — Progress Notes (Signed)
   Covid-19 Vaccination Clinic  Name:  Kyle Levy    MRN: [id number] DOB: 08-22-1976  08/02/2020  Mr. Kyle Levy was observed post Covid-19 immunization for 15 minutes without incident. He was provided with Vaccine Information Sheet and instruction to access the V-Safe system.   Mr. Kyle Levy was instructed to call 911 with any severe reactions post vaccine: Marland Kitchen Difficulty breathing  . Swelling of face and throat  . A fast heartbeat  . A bad rash all over body  . Dizziness and weakness

## 2020-08-06 ENCOUNTER — Encounter (HOSPITAL_BASED_OUTPATIENT_CLINIC_OR_DEPARTMENT_OTHER): Payer: Self-pay

## 2020-08-06 ENCOUNTER — Other Ambulatory Visit (HOSPITAL_BASED_OUTPATIENT_CLINIC_OR_DEPARTMENT_OTHER): Payer: Self-pay | Admitting: Emergency Medicine

## 2020-08-06 ENCOUNTER — Emergency Department (HOSPITAL_BASED_OUTPATIENT_CLINIC_OR_DEPARTMENT_OTHER)
Admission: EM | Admit: 2020-08-06 | Discharge: 2020-08-06 | Disposition: A | Discharge status: Home / Self Care | Payer: BC Managed Care – PPO | Attending: Emergency Medicine | Admitting: Emergency Medicine

## 2020-08-06 ENCOUNTER — Other Ambulatory Visit: Payer: Self-pay

## 2020-08-06 DIAGNOSIS — Z79899 Other long term (current) drug therapy: Secondary | ICD-10-CM | POA: Diagnosis not present

## 2020-08-06 DIAGNOSIS — M5432 Sciatica, left side: Secondary | ICD-10-CM | POA: Insufficient documentation

## 2020-08-06 DIAGNOSIS — I10 Essential (primary) hypertension: Secondary | ICD-10-CM | POA: Insufficient documentation

## 2020-08-06 DIAGNOSIS — M5430 Sciatica, unspecified side: Secondary | ICD-10-CM

## 2020-08-06 DIAGNOSIS — M549 Dorsalgia, unspecified: Secondary | ICD-10-CM | POA: Diagnosis not present

## 2020-08-06 DIAGNOSIS — M5442 Lumbago with sciatica, left side: Secondary | ICD-10-CM | POA: Diagnosis not present

## 2020-08-06 MED ORDER — METHYLPREDNISOLONE 4 MG PO TBPK: ORAL_TABLET | ORAL | 0 refills | Status: DC

## 2020-08-06 MED ORDER — HYDROCODONE-ACETAMINOPHEN 5-325 MG PO TABS
1.0000 | ORAL_TABLET | ORAL | 0 refills | Status: DC | PRN
Start: 2020-08-06 — End: 2020-08-08

## 2020-08-06 MED ORDER — DEXAMETHASONE SODIUM PHOSPHATE 10 MG/ML IJ SOLN
10.0000 mg | Freq: Once | INTRAMUSCULAR | Status: AC
  Administered 2020-08-06: 10 mg via INTRAMUSCULAR
  Filled 2020-08-06: qty 1

## 2020-08-06 MED ORDER — KETOROLAC TROMETHAMINE 60 MG/2ML IM SOLN
60.0000 mg | Freq: Once | INTRAMUSCULAR | Status: AC
  Administered 2020-08-06: 60 mg via INTRAMUSCULAR
  Filled 2020-08-06: qty 2

## 2020-08-06 MED FILL — HYDROCODON-APAP 5-325: 5-325 | 2 days supply | Qty: 10 | Fill #0

## 2020-08-06 MED FILL — METHYLPREDNISOLONE 4 MG TBP: 4 | 6 days supply | Qty: 21 | Fill #0

## 2020-08-06 NOTE — ED Provider Notes (Signed)
[facility] EMERGENCY DEPARTMENT Provider Note   CSN: [id number] Arrival date & time: 08/06/20  0263     History Chief Complaint  Patient presents with  . Back Pain    Kyle Levy is a 44 y.o. male.  Pt presents to the ED today with left sided back pain radiating down his left leg.  Pt has a hx of back pain which he's been managing with PT and with weight loss.  He helped his daughter put together an [employer] bed last night and moved a Child psychotherapist.  It did not hurt right away, but this morning; pain flared.  Pt is able to ambulate.         Past Medical History:  Diagnosis Date  . Anxiety 08/27/2011  . Back pain   . HTN (hypertension) 08/27/2011  . Hyperlipidemia 08/27/2011  . Hypertension   . Morbid obesity (HCC) 09/14/2015  . OSA on CPAP   . Psoriasis 09/14/2015    Patient Active Problem List   Diagnosis Date Noted  . Vitamin D deficiency 07/17/2020  . Prediabetes 07/13/2020  . Chronic bursitis of left shoulder 06/26/2019  . Laceration of multiple sites of skin 11/06/2018  . Seasonal and perennial allergic rhinitis 11/14/2017  . Obstructive sleep apnea 03/07/2017  . Psoriasis 09/14/2015  . Class 3 severe obesity with serious comorbidity and body mass index (BMI) of 50.0 to 59.9 in adult (HCC) 09/14/2015  . Hematochezia 05/01/2012  . Essential hypertension 08/27/2011  . Mixed hyperlipidemia 08/27/2011  . Anxiety 08/27/2011  . Preventative health care 08/21/2011    Past Surgical History:  Procedure Laterality Date  . KNEE ARTHROSCOPY     bilateral knee       Family History  Problem Relation Age of Onset  . Heart disease Father   . Hypertension Father   . Stroke Father   . Diabetes Father   . Hyperlipidemia Father   . Sleep apnea Father   . Alcoholism Father   . Eating disorder Father   . Obesity Father   . Sleep apnea Mother     Social History   Tobacco Use  . Smoking status: Never Smoker  . Smokeless tobacco: Never Used  Substance Use  Topics  . Alcohol use: Yes    Comment: social  . Drug use: No    Home Medications Prior to Admission medications   Medication Sig Start Date End Date Taking? Authorizing Provider  amLODipine (NORVASC) 10 MG tablet TAKE 1 TABLET DAILY. 07/12/20   Corwin Levins, MD  atorvastatin (LIPITOR) 20 MG tablet TAKE 1 TABLET DAILY. 07/12/20   Corwin Levins, MD  busPIRone (BUSPAR) 10 MG tablet Take 1 tablet (10 mg total) by mouth 3 (three) times daily as needed. 07/12/20   Corwin Levins, MD  Diclofenac Sodium (PENNSAID) 2 % SOLN Place 2 g onto the skin 2 (two) times daily. 06/26/19   Judi Saa, DO  Ergocalciferol (VITAMIN D2) 50 MCG (2000 UT) TABS Take by mouth. 2 po qd per pt    [provider]  escitalopram (LEXAPRO) 20 MG tablet TAKE 1 TABLET DAILY 07/20/20   Corwin Levins, MD  HYDROcodone-acetaminophen (NORCO/VICODIN) 5-325 MG tablet Take 1 tablet by mouth every 4 (four) hours as needed. 08/06/20   Jacalyn Lefevre, MD  levocetirizine (XYZAL) 5 MG tablet Take 1 tablet (5 mg total) by mouth every evening. 01/04/20   Myrlene Broker, MD  losartan (COZAAR) 100 MG tablet TAKE 1 TABLET DAILY. 07/12/20  Corwin Levins, MD  methocarbamol (ROBAXIN) 500 MG tablet Take 1 tablet (500 mg total) by mouth 3 (three) times daily. 06/23/20   Rodolph Bong, MD  methylPREDNISolone (MEDROL DOSEPAK) 4 MG TBPK tablet Take as directed 08/06/20   Jacalyn Lefevre, MD  Multiple Vitamin (MULTIVITAMIN) tablet Take 1 tablet by mouth daily.      [provider]  Omega-3 Fatty Acids (FISH OIL PO) Take by mouth daily.    [provider]    Allergies    Patient has no known allergies.  Review of Systems   Review of Systems  Musculoskeletal: Positive for back pain.  All other systems reviewed and are negative.   Physical Exam Updated Vital Signs BP (!) 161/105 (BP Location: Right Arm)   Pulse 73   Temp 98.2 F (36.8 C) (Oral)   Resp 20   SpO2 96%   Physical Exam Vitals and nursing  note reviewed.  Constitutional:      Appearance: Normal appearance. He is obese.  HENT:     Head: Normocephalic and atraumatic.     Right Ear: External ear normal.     Left Ear: External ear normal.     Nose: Nose normal.     Mouth/Throat:     Mouth: Mucous membranes are moist.     Pharynx: Oropharynx is clear.  Eyes:     Extraocular Movements: Extraocular movements intact.     Conjunctiva/sclera: Conjunctivae normal.     Pupils: Pupils are equal, round, and reactive to light.  Cardiovascular:     Rate and Rhythm: Normal rate and regular rhythm.     Pulses: Normal pulses.     Heart sounds: Normal heart sounds.  Pulmonary:     Effort: Pulmonary effort is normal.     Breath sounds: Normal breath sounds.  Abdominal:     General: Abdomen is flat. Bowel sounds are normal.     Palpations: Abdomen is soft.  Musculoskeletal:     Cervical back: Normal range of motion and neck supple.       Back:  Skin:    General: Skin is warm.     Capillary Refill: Capillary refill takes less than 2 seconds.  Neurological:     General: No focal deficit present.     Mental Status: He is alert and oriented to person, place, and time.  Psychiatric:        Mood and Affect: Mood normal.        Behavior: Behavior normal.     ED Results / Procedures / Treatments   Labs (all labs ordered are listed, but only abnormal results are displayed) Labs Reviewed - No data to display  EKG None  Radiology No results found.  Procedures Procedures (including critical care time)  Medications Ordered in ED Medications  dexamethasone (DECADRON) injection 10 mg (has no administration in time range)  ketorolac (TORADOL) injection 60 mg (has no administration in time range)    ED Course  I have reviewed the triage vital signs and the nursing notes.  Pertinent labs & imaging results that were available during my care of the patient were reviewed by me and considered in my medical decision making (see  chart for details).    MDM Rules/Calculators/A&P                          Pt will be given toradol and decadron in ED.  He will be d/c with a medrol  dose pack and a short course of lortab.  He is already on robaxin.  He is encouraged to continue his PT and attempts at weight loss.  Return if worse.  Final Clinical Impression(s) / ED Diagnoses Final diagnoses:  Acute sciatica    Rx / DC Orders ED Discharge Orders         Ordered    methylPREDNISolone (MEDROL DOSEPAK) 4 MG TBPK tablet        08/06/20 0901    HYDROcodone-acetaminophen (NORCO/VICODIN) 5-325 MG tablet  Every 4 hours PRN        08/06/20 0901           Jacalyn Lefevre, MD 08/06/20 [id number]

## 2020-08-06 NOTE — ED Triage Notes (Signed)
He states he is undergoing "on-going physical therapy for left-sided back pain". He tells me he is here d/t a pain flare this morning. He is ambulatory and in no distress.

## 2020-08-07 ENCOUNTER — Encounter: Payer: Self-pay | Admitting: Physician Assistant

## 2020-08-07 ENCOUNTER — Telehealth: Payer: BC Managed Care – PPO | Admitting: Physician Assistant

## 2020-08-07 DIAGNOSIS — M5442 Lumbago with sciatica, left side: Secondary | ICD-10-CM

## 2020-08-07 MED ORDER — TIZANIDINE HCL 2 MG PO CAPS: 2.0000 mg | ORAL_CAPSULE | Freq: Three times a day (TID) | ORAL | 0 refills | Status: DC

## 2020-08-07 NOTE — Progress Notes (Signed)
We are sorry that you are not feeling well.  Here is how we plan to help!  Based on what you have shared with me it looks like you mostly have acute back pain.  Acute back pain is defined as musculoskeletal pain that can resolve in 1-3 weeks with conservative treatment.  I have prescribed Tizanidine 2 mg three times daily as needed which is a muscle relaxer  Some patients experience stomach irritation or in increased heartburn with anti-inflammatory drugs.  Please keep in mind that muscle relaxer's can cause fatigue and should not be taken while at work or driving.  Back pain is very common.  The pain often gets better over time.  The cause of back pain is usually not dangerous.  Most people can learn to manage their back pain on their own.  PlePlease be advised, the muscle relaxant and the narcotic pain medicine you have been prescribed, can cause increase in sedation. Avoid driving, operating machinery, or any other activity that requires your full alertness while on both medications. Marland Kitchen ase be advised, the muscle relaxant and the narcotic pain medicine you have been prescribed, can cause increase in sedation. Avoid driving, operating machinery, or any other activity that requires your full alertness while on both medications. .   Home Care  Stay active.  Start with short walks on flat ground if you can.  Try to walk farther each day.  Do not sit, drive or stand in one place for more than 30 minutes.  Do not stay in bed.  Do not avoid exercise or work.  Activity can help your back heal faster.  Be careful when you bend or lift an object.  Bend at your knees, keep the object close to you, and do not twist.  Sleep on a firm mattress.  Lie on your side, and bend your knees.  If you lie on your back, put a pillow under your knees.  Only take medicines as told by your doctor.  Put ice on the injured area.  Put ice in a plastic bag  Place a towel between your skin and the bag  Leave the ice  on for 15-20 minutes, 3-4 times a day for the first 2-3 days. 210 After that, you can switch between ice and heat packs.  Ask your doctor about back exercises or massage.  Avoid feeling anxious or stressed.  Find good ways to deal with stress, such as exercise.  Get Help Right Way If:  Your pain does not go away with rest or medicine.  Your pain does not go away in 1 week.  You have new problems.  You do not feel well.  The pain spreads into your legs.  You cannot control when you poop (bowel movement) or pee (urinate)  You feel sick to your stomach (nauseous) or throw up (vomit)  You have belly (abdominal) pain.  You feel like you may pass out (faint).  If you develop a fever.  Make Sure you:  Understand these instructions.  Will watch your condition  Will get help right away if you are not doing well or get worse.  Your e-visit answers were reviewed by a board certified advanced clinical practitioner to complete your personal care plan.  Depending on the condition, your plan could have included both over the counter or prescription medications.  If there is a problem please reply  once you have received a response from your provider.  Your safety is important to [place].  If you have drug allergies check your prescription carefully.    You can use MyChart to ask questions about today's visit, request a non-urgent call back, or ask for a work or school excuse for 24 hours related to this e-Visit. If it has been greater than 24 hours you will need to follow up with your provider, or enter a new e-Visit to address those concerns.  You will get an e-mail in the next two days asking about your experience.  I hope that your e-visit has been valuable and will speed your recovery. Thank you for using e-visits.  .I spent 5-10 minutes on review and completion of this note- Illa Level [facility]

## 2020-08-08 ENCOUNTER — Other Ambulatory Visit: Payer: Self-pay

## 2020-08-08 ENCOUNTER — Ambulatory Visit (INDEPENDENT_AMBULATORY_CARE_PROVIDER_SITE_OTHER): Payer: BC Managed Care – PPO

## 2020-08-08 ENCOUNTER — Encounter: Payer: Self-pay | Admitting: Family Medicine

## 2020-08-08 ENCOUNTER — Ambulatory Visit (INDEPENDENT_AMBULATORY_CARE_PROVIDER_SITE_OTHER): Payer: BC Managed Care – PPO | Admitting: Family Medicine

## 2020-08-08 ENCOUNTER — Other Ambulatory Visit: Payer: Self-pay | Admitting: Family Medicine

## 2020-08-08 VITALS — BP 162/102 | HR 77 | Ht 71.0 in | Wt 396.2 lb

## 2020-08-08 DIAGNOSIS — M48061 Spinal stenosis, lumbar region without neurogenic claudication: Secondary | ICD-10-CM

## 2020-08-08 DIAGNOSIS — M4317 Spondylolisthesis, lumbosacral region: Secondary | ICD-10-CM | POA: Diagnosis not present

## 2020-08-08 DIAGNOSIS — M5416 Radiculopathy, lumbar region: Secondary | ICD-10-CM

## 2020-08-08 DIAGNOSIS — M4316 Spondylolisthesis, lumbar region: Secondary | ICD-10-CM | POA: Diagnosis not present

## 2020-08-08 DIAGNOSIS — M545 Low back pain, unspecified: Secondary | ICD-10-CM | POA: Diagnosis not present

## 2020-08-08 MED ORDER — HYDROCODONE-ACETAMINOPHEN 10-325 MG PO TABS: 1.0000 | ORAL_TABLET | Freq: Three times a day (TID) | ORAL | 0 refills | Status: DC | PRN

## 2020-08-08 MED ORDER — GABAPENTIN 300 MG PO CAPS
300.0000 mg | ORAL_CAPSULE | Freq: Three times a day (TID) | ORAL | 3 refills | Status: DC | PRN
Start: 2020-08-08 — End: 2020-09-01

## 2020-08-08 MED ORDER — PREDNISONE 50 MG PO TABS: 50.0000 mg | ORAL_TABLET | Freq: Every day | ORAL | 0 refills | Status: DC

## 2020-08-08 MED FILL — GABAPENTIN 300 MG CAPSULE: 300 | 10 days supply | Qty: 90 | Fill #0

## 2020-08-08 MED FILL — predniSONE 50 MG TABS: 50 | 5 days supply | Qty: 5 | Fill #0

## 2020-08-08 MED FILL — HYDROCODON-APAP 10-325: 10-325 | 5 days supply | Qty: 15 | Fill #0

## 2020-08-08 NOTE — Progress Notes (Signed)
I, Philbert Riser, LAT, ATC acting as a scribe for Clementeen Graham, MD.  Kyle Levy is a 44 y.o. male who presents to [facility] at [facility] today for f/u from ED visit on 08/06/20 w/ L-sided back pn radiating down L leg. Pt was previously seen by Dr. Denyse Amass on 06/13/20 for a lumbosacral strain and was advised to use heating pad, TENS unit, and to f/u w/ PT and with weight loss. Today, pt reports pn in R buttock that radiates down posterior aspect of leg, down to great toe. Pt reports relief for about 4-6 hrs from intramuscular steroid injection received from ED. 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 Pt reports he received his COVID booster Tuesday, 08/02/20.  He denies any bowel or bladder dysfunction or groin numbness.  He does note a little bit of left leg weakness.  Radiating pn: yes LE numbness/tingling: yes LE weakness yes Rx tried: TENS unit   Pertinent review of systems: No fevers or chills  Relevant historical information: Morbid obesity   Exam:  BP (!) 162/102 (BP Location: Right Arm, Patient Position: Sitting, Cuff Size: Large)   Pulse 77   Ht 5\' 11"  (1.803 m)   Wt (!) 396 lb 3.2 oz (179.7 kg)   SpO2 96%   BMI 55.26 kg/m  General: Well Developed, well nourished, and in no acute distress.   MSK: L-spine nontender midline.  Limited lumbar motion.  Positive left-sided slump test. Lower extremity strength is intact with the exception of foot and great toe dorsiflexion which is diminished 4/5 left compared to right. Antalgic gait.    Lab and Radiology Results X-ray images L-spine obtained today personally and independently interpreted. Spondylolisthesis grade 1 L5-S1.  No acute fractures.  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    Assessment and Plan: 44 y.o. male with left leg pain new in the setting of subacute to chronic low back pain.  Patient lumbar  radiculopathy at L5 dermatomal pattern with some weakness to foot dorsiflexion and great toe dorsiflexion.  This all corresponds with what appears to be spondylolisthesis at L5-S1.  Given his significantly worsening new neurologic symptoms with some weakness will proceed with stat MRI.  I have contacted the imaging center and he is scheduled for today at [facility] at 4:30 PM.  Check back likely Wednesday to review MRI findings.  In the meantime we will modify current medication protocol.  Will increase steroids for Medrol Dosepak to prednisone 50 for 5 days.  We will discontinue tizanidine and add gabapentin for nerve pain.  Additionally will increase hydrocodone dose from 5 mg to 10 mg size pills.  Recheck back soon.  Recommend laxatives with hydrocodone to avoid constipation.   PDMP reviewed during this encounter. Orders Placed This Encounter  Procedures  . MR Lumbar Spine Wo Contrast    Standing Status:   Future    Standing Expiration Date:   08/08/2021    Order Specific Question:   What is the patient's sedation requirement?    Answer:   No Sedation    Order Specific Question:   Does the patient have a pacemaker or implanted devices?    Answer:   No    Order Specific Question:   Preferred imaging location?    Answer:   08/10/2021 (table limit-350lbs)  . DG Lumbar Spine 2-3 Views    Standing Status:   Future  Number of Occurrences:   1    Standing Expiration Date:   08/08/2021    Order Specific Question:   Reason for Exam (SYMPTOM  OR DIAGNOSIS REQUIRED)    Answer:   eval lt sciatica    Order Specific Question:   Preferred imaging location?    Answer:   [facility]   Meds ordered this encounter  Medications  . gabapentin (NEURONTIN) 300 MG capsule    Sig: Take 1-3 capsules (300-900 mg total) by mouth 3 (three) times daily as needed.    Dispense:  90 capsule    Refill:  3  . predniSONE (DELTASONE) 50 MG tablet    Sig: Take 1 tablet (50 mg total) by  mouth daily.    Dispense:  5 tablet    Refill:  0  . HYDROcodone-acetaminophen (NORCO) 10-325 MG tablet    Sig: Take 1 tablet by mouth every 8 (eight) hours as needed.    Dispense:  15 tablet    Refill:  0     Discussed warning signs or symptoms. Please see discharge instructions. Patient expresses understanding.   The above documentation has been reviewed and is accurate and complete Clementeen Graham, M.D.  Symptoms are severe

## 2020-08-08 NOTE — Telephone Encounter (Signed)
Appt made for today, 08/08/2020.

## 2020-08-08 NOTE — Patient Instructions (Addendum)
Thank you for coming in today.  STOP medrol dosepack and Tizanidine.   START prednisone 50mg  daily.   START norco 10 as needed. Use sparingly. Take with laxitivie (sennakot or ducolax).   START gabapentin 1-3 up to 3x dialy for nerve pain.   GET MRI today.  [facility] at [facility] Get there early.  Address: [address], [place], [place]  Recheck after MRI likely Wednesday.    Please get an Xray today before you leave

## 2020-08-09 ENCOUNTER — Telehealth: Payer: Self-pay | Admitting: Family Medicine

## 2020-08-09 DIAGNOSIS — M5416 Radiculopathy, lumbar region: Secondary | ICD-10-CM

## 2020-08-09 DIAGNOSIS — M4317 Spondylolisthesis, lumbosacral region: Secondary | ICD-10-CM

## 2020-08-09 NOTE — Progress Notes (Signed)
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  This is associated with a bulging disc and causing a pinched nerve down your leg.  The good news is this should be treatable.  I have already ordered an epidural steroid injection.  Please call [facility] at [phone] to schedule the injection.  Reasonable to follow-up with me in the near future to review the MRI or the aftermath of the injection.  Research spondylolisthesis.  This is basically what you have and can be treatable with injection and physical therapy.

## 2020-08-09 NOTE — Telephone Encounter (Signed)
Ordered epidural steroid injection.  Advised patient on [phone] to schedule injection.

## 2020-08-09 NOTE — Telephone Encounter (Signed)
The order has been faxed to [facility] imaging. Patient viewed results through mychart so he has the number to call

## 2020-08-10 ENCOUNTER — Ambulatory Visit (INDEPENDENT_AMBULATORY_CARE_PROVIDER_SITE_OTHER): Payer: BC Managed Care – PPO | Admitting: Family Medicine

## 2020-08-11 ENCOUNTER — Ambulatory Visit
Admission: RE | Admit: 2020-08-11 | Discharge: 2020-08-11 | Disposition: A | Discharge status: Home / Self Care | Payer: BC Managed Care – PPO | Source: Ambulatory Visit | Attending: Family Medicine | Admitting: Family Medicine

## 2020-08-11 ENCOUNTER — Encounter: Payer: Self-pay | Admitting: Family Medicine

## 2020-08-11 DIAGNOSIS — M5416 Radiculopathy, lumbar region: Secondary | ICD-10-CM

## 2020-08-11 DIAGNOSIS — M4317 Spondylolisthesis, lumbosacral region: Secondary | ICD-10-CM

## 2020-08-11 MED ORDER — METHYLPREDNISOLONE ACETATE 40 MG/ML INJ SUSP (RADIOLOG
120.0000 mg | Freq: Once | INTRAMUSCULAR | Status: AC
  Administered 2020-08-11: 120 mg via EPIDURAL

## 2020-08-11 MED ORDER — IOPAMIDOL (ISOVUE-M 200) INJECTION 41%
1.0000 mL | Freq: Once | INTRAMUSCULAR | Status: AC
  Administered 2020-08-11: 1 mL via EPIDURAL

## 2020-08-11 NOTE — Discharge Instructions (Signed)

## 2020-08-12 ENCOUNTER — Other Ambulatory Visit: Payer: Self-pay | Admitting: Family Medicine

## 2020-08-12 MED ORDER — HYDROCODONE-ACETAMINOPHEN 10-325 MG PO TABS: 1.0000 | ORAL_TABLET | Freq: Three times a day (TID) | ORAL | 0 refills | Status: DC | PRN

## 2020-08-12 MED FILL — HYDROCODON-APAP 10-325: 10-325 | 3 days supply | Qty: 10 | Fill #0

## 2020-08-15 ENCOUNTER — Encounter (INDEPENDENT_AMBULATORY_CARE_PROVIDER_SITE_OTHER): Payer: Self-pay | Admitting: Family Medicine

## 2020-08-17 MED FILL — GABAPENTIN 300 MG CAPSULE: 300 | 10 days supply | Qty: 90 | Fill #1

## 2020-08-22 DIAGNOSIS — S39012A Strain of muscle, fascia and tendon of lower back, initial encounter: Secondary | ICD-10-CM | POA: Diagnosis not present

## 2020-08-23 ENCOUNTER — Encounter: Payer: Self-pay | Admitting: Family Medicine

## 2020-08-23 DIAGNOSIS — M5416 Radiculopathy, lumbar region: Secondary | ICD-10-CM

## 2020-08-23 DIAGNOSIS — M4317 Spondylolisthesis, lumbosacral region: Secondary | ICD-10-CM

## 2020-08-24 ENCOUNTER — Other Ambulatory Visit: Payer: Self-pay | Admitting: Family Medicine

## 2020-08-24 MED ORDER — HYDROCODONE-ACETAMINOPHEN 10-325 MG PO TABS: 1.0000 | ORAL_TABLET | Freq: Three times a day (TID) | ORAL | 0 refills | Status: DC | PRN

## 2020-08-24 MED FILL — HYDROCODON-APAP 10-325: 10-325 | 3 days supply | Qty: 10 | Fill #0

## 2020-08-30 ENCOUNTER — Ambulatory Visit (INDEPENDENT_AMBULATORY_CARE_PROVIDER_SITE_OTHER): Payer: BC Managed Care – PPO | Admitting: Family Medicine

## 2020-08-30 ENCOUNTER — Encounter (INDEPENDENT_AMBULATORY_CARE_PROVIDER_SITE_OTHER): Payer: Self-pay | Admitting: Family Medicine

## 2020-08-30 ENCOUNTER — Other Ambulatory Visit: Payer: Self-pay

## 2020-08-30 VITALS — BP 152/91 | HR 66 | Temp 97.9°F | Ht 71.0 in | Wt 383.0 lb

## 2020-08-30 DIAGNOSIS — I10 Essential (primary) hypertension: Secondary | ICD-10-CM

## 2020-08-30 DIAGNOSIS — E65 Localized adiposity: Secondary | ICD-10-CM | POA: Diagnosis not present

## 2020-08-30 DIAGNOSIS — E559 Vitamin D deficiency, unspecified: Secondary | ICD-10-CM | POA: Diagnosis not present

## 2020-08-30 DIAGNOSIS — M5416 Radiculopathy, lumbar region: Secondary | ICD-10-CM

## 2020-08-30 DIAGNOSIS — Z6841 Body Mass Index (BMI) 40.0 and over, adult: Secondary | ICD-10-CM

## 2020-08-30 DIAGNOSIS — Z9189 Other specified personal risk factors, not elsewhere classified: Secondary | ICD-10-CM | POA: Diagnosis not present

## 2020-08-30 DIAGNOSIS — R7303 Prediabetes: Secondary | ICD-10-CM | POA: Diagnosis not present

## 2020-08-30 MED FILL — GABAPENTIN 300 MG CAPSULE: 300 | 10 days supply | Qty: 90 | Fill #2

## 2020-08-30 NOTE — Progress Notes (Signed)
Chief Complaint:   OBESITY Kyle Levy is here to discuss his progress with his obesity treatment plan along with follow-up of his obesity related diagnoses.   Today's visit was #: 4 Starting weight: 393 lbs Starting date: 06/29/2020 Today's weight: 383 lbs Today's date: 08/30/2020 Total lbs lost to date: 10 lbs Body mass index is 53.42 kg/m.  Total weight loss percentage to date: -2.54%  Interim History: Kyle Levy struggles with snacking at night since his back pain has worsened.  He says he has not been tracking his intake as well.  He has his second ESI scheduled for this week.  He prefers no medications. Nutrition Plan: the Category 4 Plan for 65% of the time.  Anti-obesity medications: None.  Hunger is poorly controlled.  Cravings are poorly controlled. Activity: None at this time due to back pain.  Assessment/Plan:   1. Prediabetes Not at goal. Goal is HgbA1c < 5.7.  Medication: None.  He will continue to focus on protein-rich, low simple carbohydrate foods. We reviewed the importance of hydration, regular exercise for stress reduction, and restorative sleep.   Lab Results  Component Value Date   HGBA1C 5.9 (H) 06/29/2020   Lab Results  Component Value Date   INSULIN 34.2 (H) 06/29/2020   2. Essential hypertension Not at goal. Medications: Norvasc 10 mg daily and Cozaar 100 mg daily.   Plan: Avoid buying foods that are: processed, frozen, or prepackaged to avoid excess salt. We will continue to monitor symptoms as they relate to his weight loss journey.  BP Readings from Last 3 Encounters:  08/30/20 (!) 152/91  08/11/20 (!) 183/108  08/08/20 (!) 162/102   Lab Results  Component Value Date   CREATININE 0.80 06/29/2020   3. Visceral obesity Current visceral fat rating: 34. Visceral fat rating should be < 13. Visceral adipose tissue is a hormonally active component of total body fat. This body composition phenotype is associated with medical disorders such as metabolic  syndrome, cardiovascular disease and several malignancies including prostate, breast, and colorectal cancers. Starting goal: Lose 7-10% of starting weight.   4. Vitamin D deficiency Not at goal. Current vitamin D is 31.9 tested on 06/29/2020. Optimal goal > 50 ng/dL.   Plan:  []   Continue Vitamin D @50 ,000 IU every week. [x]   Continue home supplement daily. [x]   Follow-up for routine testing of Vitamin D at least 2-3 times per year to avoid over-replacement.  5. Lumbar radiculopathy Worse.  He will be getting an MRI through Sports Medicine.  He will have an ESI in [place]. We will continue to monitor symptoms as they relate to his weight loss journey.  6. At risk for activity intolerance Kyle Levy was given approximately 10 minutes of exercise intolerance counseling today. He is 44 y.o. male and has risk factors exercise intolerance including obesity. We discussed intensive lifestyle modifications today with an emphasis on specific weight loss instructions and strategies. Kyle Levy will slowly increase activity as tolerated.   7. Class 3 severe obesity with serious comorbidity and body mass index (BMI) of 50.0 to 59.9 in adult, unspecified obesity type [facility])  Course: Kyle Levy is currently in the action stage of change. As such, his goal is to continue with weight loss efforts.   Nutrition goals: He has agreed to the Category 4 Plan.   Exercise goals: As tolerated.  Behavioral modification strategies: increasing lean protein intake, decreasing simple carbohydrates, increasing vegetables and increasing water intake.  Kyle Levy has agreed to follow-up with our clinic in  4 weeks. He was informed of the importance of frequent follow-up visits to maximize his success with intensive lifestyle modifications for his multiple health conditions.   Objective:   Blood pressure (!) 152/91, pulse 66, temperature 97.9 F (36.6 C), temperature source Oral, height 5\' 11"  (1.803 m), weight (!) 383 lb (173.7 kg), SpO2  97 %. Body mass index is 53.42 kg/m.  General: Cooperative, alert, well developed, in no acute distress. HEENT: Conjunctivae and lids unremarkable. Cardiovascular: Regular rhythm.  Lungs: Normal work of breathing. Neurologic: No focal deficits.   Lab Results  Component Value Date   CREATININE 0.80 06/29/2020   BUN 13 06/29/2020   NA 138 06/29/2020   K 4.1 06/29/2020   CL 99 06/29/2020   CO2 22 06/29/2020   Lab Results  Component Value Date   ALT 35 06/29/2020   AST 26 06/29/2020   ALKPHOS 103 06/29/2020   BILITOT 0.6 06/29/2020   Lab Results  Component Value Date   HGBA1C 5.9 (H) 06/29/2020   Lab Results  Component Value Date   INSULIN 34.2 (H) 06/29/2020   Lab Results  Component Value Date   TSH 1.810 06/29/2020   Lab Results  Component Value Date   CHOL 183 06/29/2020   HDL 32 (L) 06/29/2020   LDLCALC 114 (H) 06/29/2020   LDLDIRECT 85.0 04/10/2016   TRIG 212 (H) 06/29/2020   CHOLHDL 4 05/28/2018   Lab Results  Component Value Date   WBC 6.6 06/29/2020   HGB 14.7 06/29/2020   HCT 43.3 06/29/2020   MCV 84 06/29/2020   PLT 330 06/29/2020   Lab Results  Component Value Date   IRON 83 06/29/2020   TIBC 293 06/29/2020   FERRITIN 132 06/29/2020   Attestation Statements:   Reviewed by clinician on day of visit: allergies, medications, problem list, medical history, surgical history, family history, social history, and previous encounter notes.  I, 08/29/2020, CMA, am acting as transcriptionist for Insurance claims handler, DO  I have reviewed the above documentation for accuracy and completeness, and I agree with the above. Helane Rima, DO

## 2020-08-31 ENCOUNTER — Other Ambulatory Visit: Payer: BC Managed Care – PPO

## 2020-08-31 DIAGNOSIS — S39012A Strain of muscle, fascia and tendon of lower back, initial encounter: Secondary | ICD-10-CM | POA: Diagnosis not present

## 2020-09-01 ENCOUNTER — Other Ambulatory Visit: Payer: Self-pay | Admitting: Internal Medicine

## 2020-09-01 ENCOUNTER — Encounter: Payer: Self-pay | Admitting: Family Medicine

## 2020-09-01 ENCOUNTER — Ambulatory Visit
Admission: RE | Admit: 2020-09-01 | Discharge: 2020-09-01 | Disposition: A | Discharge status: Home / Self Care | Payer: BC Managed Care – PPO | Source: Ambulatory Visit | Attending: Family Medicine | Admitting: Family Medicine

## 2020-09-01 ENCOUNTER — Other Ambulatory Visit: Payer: Self-pay

## 2020-09-01 DIAGNOSIS — M4317 Spondylolisthesis, lumbosacral region: Secondary | ICD-10-CM

## 2020-09-01 DIAGNOSIS — M47817 Spondylosis without myelopathy or radiculopathy, lumbosacral region: Secondary | ICD-10-CM | POA: Diagnosis not present

## 2020-09-01 DIAGNOSIS — M5416 Radiculopathy, lumbar region: Secondary | ICD-10-CM

## 2020-09-01 MED ORDER — METHYLPREDNISOLONE ACETATE 40 MG/ML INJ SUSP (RADIOLOG
120.0000 mg | Freq: Once | INTRAMUSCULAR | Status: AC
  Administered 2020-09-01: 120 mg via EPIDURAL

## 2020-09-01 MED ORDER — IOPAMIDOL (ISOVUE-M 200) INJECTION 41%
1.0000 mL | Freq: Once | INTRAMUSCULAR | Status: AC
  Administered 2020-09-01: 1 mL via EPIDURAL

## 2020-09-01 MED ORDER — GABAPENTIN 300 MG PO CAPS
300.0000 mg | ORAL_CAPSULE | Freq: Three times a day (TID) | ORAL | 3 refills | Status: DC | PRN
Start: 2020-09-01 — End: 2022-07-12

## 2020-09-01 NOTE — Discharge Instructions (Signed)

## 2020-09-06 ENCOUNTER — Encounter (INDEPENDENT_AMBULATORY_CARE_PROVIDER_SITE_OTHER): Payer: Self-pay | Admitting: Adult Health

## 2020-09-13 ENCOUNTER — Ambulatory Visit (INDEPENDENT_AMBULATORY_CARE_PROVIDER_SITE_OTHER): Payer: BC Managed Care – PPO | Admitting: Adult Health

## 2020-10-12 ENCOUNTER — Telehealth (INDEPENDENT_AMBULATORY_CARE_PROVIDER_SITE_OTHER): Payer: No Typology Code available for payment source | Admitting: Family Medicine

## 2020-10-12 ENCOUNTER — Encounter (INDEPENDENT_AMBULATORY_CARE_PROVIDER_SITE_OTHER): Payer: Self-pay | Admitting: Family Medicine

## 2020-10-12 ENCOUNTER — Other Ambulatory Visit: Payer: Self-pay

## 2020-10-12 DIAGNOSIS — E559 Vitamin D deficiency, unspecified: Secondary | ICD-10-CM

## 2020-10-12 DIAGNOSIS — M545 Low back pain, unspecified: Secondary | ICD-10-CM

## 2020-10-12 DIAGNOSIS — R7303 Prediabetes: Secondary | ICD-10-CM

## 2020-10-12 DIAGNOSIS — I10 Essential (primary) hypertension: Secondary | ICD-10-CM | POA: Diagnosis not present

## 2020-10-12 DIAGNOSIS — Z9189 Other specified personal risk factors, not elsewhere classified: Secondary | ICD-10-CM

## 2020-10-12 DIAGNOSIS — Z6841 Body Mass Index (BMI) 40.0 and over, adult: Secondary | ICD-10-CM

## 2020-10-12 NOTE — Progress Notes (Signed)
TeleHealth Visit:  Due to the COVID-19 pandemic, this visit was completed with telemedicine (audio/video) technology to reduce patient and provider exposure as well as to preserve personal protective equipment.   Kyle Levy has verbally consented to this TeleHealth visit. The patient is located at home, the provider is located at the [facility] office. The participants in this visit include the listed provider and patient and. The visit was conducted today via MyChart.  OBESITY Kyle Levy is here to discuss his progress with his obesity treatment plan along with follow-up of his obesity related diagnoses.   Today's visit was #: 5 Starting weight: 393 lbs Starting date: 06/29/2020 Today's date: 10/12/2020  Interim History: Kyle Levy says that he struggled through the holidays.  He had to find a new job.  He is working for home this week due to the weather.  He says he is trying something new for breakfast, and it is very satisfying.  He endorses having a sweet tooth at night, but he says he is making good choices.  He prefers to be on no medications.  Nutrition Plan: the Category 4 Plan for 20% of the time.  Activity: Light walking.  Assessment/Plan:   1. Vitamin D deficiency Improving, but not optimized. Current vitamin D is 31.9, tested on 06/29/2020. Optimal goal > 50 ng/dL.   Plan:  []   Continue Vitamin D @50 ,000 IU every week. [x]   Continue home supplement of 5,000 IU daily. [x]   Follow-up for routine testing of Vitamin D at least 2-3 times per year to avoid over-replacement.  2. Prediabetes Improving, but not optimized. Goal is HgbA1c < 5.7.  He will continue to focus on protein-rich, low simple carbohydrate foods. We reviewed the importance of hydration, regular exercise for stress reduction, and restorative sleep.   Lab Results  Component Value Date   HGBA1C 5.9 (H) 06/29/2020   Lab Results  Component Value Date   INSULIN 34.2 (H) 06/29/2020   3. Essential  hypertension Elevated today.  Medications: amlodipine 10 mg daily and losartan 100 mg daily.   Plan: Avoid buying foods that are: processed, frozen, or prepackaged to avoid excess salt. We will continue to monitor symptoms as they relate to his weight loss journey.  BP Readings from Last 3 Encounters:  09/01/20 (!) 158/106  08/30/20 (!) 152/91  08/11/20 (!) 183/108   Lab Results  Component Value Date   CREATININE 0.80 06/29/2020   4. Low back pain Much better after 2nd ESI.  He says he is ready to start exercising.  5. Class 3 severe obesity with serious comorbidity and body mass index (BMI) of 50.0 to 59.9 in adult, unspecified obesity type (HCC)  Kyle Levy is currently in the action stage of change. As such, his goal is to continue with weight loss efforts. He has agreed to the Category 4 Plan with different breakfast options.   Exercise goals: Increase slowly.  Behavioral modification strategies: increasing lean protein intake, decreasing simple carbohydrates, increasing vegetables, increasing water intake and keeping a strict food journal.  Kyle Levy has agreed to follow-up with our clinic in 2-3 weeks. He was informed of the importance of frequent follow-up visits to maximize his success with intensive lifestyle modifications for his multiple health conditions.  Objective:   VITALS: Per patient if applicable, see vitals. GENERAL: Alert and in no acute distress. CARDIOPULMONARY: No increased WOB. Speaking in clear sentences.  PSYCH: Pleasant and cooperative. Speech normal rate and rhythm. Affect is appropriate. Insight and judgement are appropriate.  Attention is focused, linear, and appropriate.  NEURO: Oriented as arrived to appointment on time with no prompting.   Lab Results  Component Value Date   CREATININE 0.80 06/29/2020   BUN 13 06/29/2020   NA 138 06/29/2020   K 4.1 06/29/2020   CL 99 06/29/2020   CO2 22 06/29/2020   Lab Results  Component Value Date   ALT 35  06/29/2020   AST 26 06/29/2020   ALKPHOS 103 06/29/2020   BILITOT 0.6 06/29/2020   Lab Results  Component Value Date   HGBA1C 5.9 (H) 06/29/2020   Lab Results  Component Value Date   INSULIN 34.2 (H) 06/29/2020   Lab Results  Component Value Date   TSH 1.810 06/29/2020   Lab Results  Component Value Date   CHOL 183 06/29/2020   HDL 32 (L) 06/29/2020   LDLCALC 114 (H) 06/29/2020   LDLDIRECT 85.0 04/10/2016   TRIG 212 (H) 06/29/2020   CHOLHDL 4 05/28/2018   Lab Results  Component Value Date   WBC 6.6 06/29/2020   HGB 14.7 06/29/2020   HCT 43.3 06/29/2020   MCV 84 06/29/2020   PLT 330 06/29/2020   Lab Results  Component Value Date   IRON 83 06/29/2020   TIBC 293 06/29/2020   FERRITIN 132 06/29/2020   Attestation Statements:   Reviewed by clinician on day of visit: allergies, medications, problem list, medical history, surgical history, family history, social history, and previous encounter notes.  I, Insurance claims handler, CMA, am acting as transcriptionist for Helane Rima, DO  I have reviewed the above documentation for accuracy and completeness, and I agree with the above. Helane Rima, DO

## 2020-10-24 MED FILL — ESCITALOPRAM 20 MG TABLET: 20 | 90 days supply | Qty: 90 | Fill #0

## 2020-11-16 ENCOUNTER — Other Ambulatory Visit: Payer: Self-pay

## 2020-11-16 ENCOUNTER — Other Ambulatory Visit (INDEPENDENT_AMBULATORY_CARE_PROVIDER_SITE_OTHER): Payer: Self-pay | Admitting: Family Medicine

## 2020-11-16 ENCOUNTER — Encounter (INDEPENDENT_AMBULATORY_CARE_PROVIDER_SITE_OTHER): Payer: Self-pay | Admitting: Family Medicine

## 2020-11-16 ENCOUNTER — Ambulatory Visit (INDEPENDENT_AMBULATORY_CARE_PROVIDER_SITE_OTHER): Payer: No Typology Code available for payment source | Admitting: Family Medicine

## 2020-11-16 VITALS — BP 148/90 | HR 69 | Temp 98.0°F | Ht 71.0 in | Wt 390.0 lb

## 2020-11-16 DIAGNOSIS — E8881 Metabolic syndrome: Secondary | ICD-10-CM

## 2020-11-16 DIAGNOSIS — R7303 Prediabetes: Secondary | ICD-10-CM | POA: Diagnosis not present

## 2020-11-16 DIAGNOSIS — G4733 Obstructive sleep apnea (adult) (pediatric): Secondary | ICD-10-CM

## 2020-11-16 DIAGNOSIS — M5416 Radiculopathy, lumbar region: Secondary | ICD-10-CM | POA: Diagnosis not present

## 2020-11-16 DIAGNOSIS — E782 Mixed hyperlipidemia: Secondary | ICD-10-CM

## 2020-11-16 DIAGNOSIS — F419 Anxiety disorder, unspecified: Secondary | ICD-10-CM

## 2020-11-16 DIAGNOSIS — R632 Polyphagia: Secondary | ICD-10-CM | POA: Diagnosis not present

## 2020-11-16 DIAGNOSIS — I10 Essential (primary) hypertension: Secondary | ICD-10-CM

## 2020-11-16 DIAGNOSIS — G8929 Other chronic pain: Secondary | ICD-10-CM

## 2020-11-16 DIAGNOSIS — Z9189 Other specified personal risk factors, not elsewhere classified: Secondary | ICD-10-CM | POA: Diagnosis not present

## 2020-11-16 DIAGNOSIS — E559 Vitamin D deficiency, unspecified: Secondary | ICD-10-CM

## 2020-11-16 DIAGNOSIS — Z6841 Body Mass Index (BMI) 40.0 and over, adult: Secondary | ICD-10-CM

## 2020-11-16 DIAGNOSIS — E65 Localized adiposity: Secondary | ICD-10-CM | POA: Insufficient documentation

## 2020-11-16 MED ORDER — WEGOVY 0.25 MG/0.5ML ~~LOC~~ SOAJ: 0.2500 mg | SUBCUTANEOUS | 0 refills | Status: DC

## 2020-11-16 NOTE — Progress Notes (Signed)
Chief Complaint:   OBESITY Kyle Levy is here to discuss his progress with his obesity treatment plan along with follow-up of his obesity related diagnoses.   Today's visit was #: 6 Starting weight: 393 lbs Starting date: 06/29/2020 Today's weight: 390 lbs Today's date: 11/16/2020 Total lbs lost to date: 3 lbs Body mass index is 54.39 kg/m.  Total weight loss percentage to date: -0.76%  Interim History:  Kyle Levy is using [employer].  Breakfast and snacks are on plan.  Kyle Levy is now working from home; this is day 3.  Kyle Levy will be back in the office in July.  Kyle Levy says Kyle Levy has less motivation and has been doing less exercise.  Kyle Levy says Kyle Levy has cleared an area for the [place].  Nutrition Plan: Category 4 Plan for 20% of the time. Activity: More activity.  Assessment/Plan:   1. Essential hypertension Elevated today. Medications: Norvasc 10 mg daily, Cozaar 100 mg daily.   Plan: Avoid buying foods that are: processed, frozen, or prepackaged to avoid excess salt. We will continue to monitor closely alongside his PCP and/or Specialist.  Regular follow up with PCP and specialists was also encouraged.   BP Readings from Last 3 Encounters:  11/16/20 (!) 148/90  09/01/20 (!) 158/106  08/30/20 (!) 152/91   Lab Results  Component Value Date   CREATININE 0.80 11/16/2020   2. Polyphagia Uncontrolled.  Current treatment: None. Polyphagia refers to excessive feelings of hunger.  Plan:  Start Wegovy 0.25 mg subcutaneously weekly, as per below.  Kyle Levy will continue to focus on protein-rich, low simple carbohydrate foods. We reviewed the importance of hydration, regular exercise for stress reduction, and restorative sleep.  - Start Semaglutide-Weight Management (WEGOVY) 0.25 MG/0.5ML SOAJ; Inject 0.25 mg into the skin once a week.  Dispense: 2 mL; Refill: 0  3. Prediabetes Not at goal. Goal is HgbA1c < 5.7.  Medication: None.    Plan:  Kyle Levy will continue to focus on protein-rich, low simple  carbohydrate foods. We reviewed the importance of hydration, regular exercise for stress reduction, and restorative sleep.  Will check labs today, as per below.  Lab Results  Component Value Date   HGBA1C 5.8 (H) 11/16/2020   Lab Results  Component Value Date   INSULIN 34.2 (H) 06/29/2020   - Comprehensive metabolic panel - Hemoglobin A1c  4. Chronic radicular pain of lower back Much better after 2nd ESI.  Kyle Levy says Kyle Levy is ready to start exercising.  5. Mixed hyperlipidemia Course: Not at goal. Lipid-lowering medications: Lipitor 20 mg daily, Omega-3.   Plan: Dietary changes: Increase soluble fiber, decrease simple carbohydrates, decrease saturated fat. Exercise changes: Moderate to vigorous-intensity aerobic activity 150 minutes per week or as tolerated. We will continue to monitor along with PCP/specialists as it pertains to his weight loss journey.  Will check lipid panel today.  Lab Results  Component Value Date   CHOL 169 11/16/2020   HDL 34 (L) 11/16/2020   LDLCALC 106 (H) 11/16/2020   LDLDIRECT 85.0 04/10/2016   TRIG 161 (H) 11/16/2020   CHOLHDL 5.0 11/16/2020   Lab Results  Component Value Date   ALT 27 11/16/2020   AST 19 11/16/2020   ALKPHOS 116 11/16/2020   BILITOT 0.6 11/16/2020   The 10-year ASCVD risk score Denman George DC Jr., et al., 2013) is: 3%   Values used to calculate the score:     Age: 45 years     Sex: Male     Is Non-Hispanic African  American: No     Diabetic: No     Tobacco smoker: No     Systolic Blood Pressure: 148 mmHg     Is BP treated: Yes     HDL Cholesterol: 34 mg/dL     Total Cholesterol: 169 mg/dL  - Lipid panel  6. Visceral obesity Current visceral fat rating: 34. Visceral fat rating should be < 13. Visceral adipose tissue is a hormonally active component of total body fat. This body composition phenotype is associated with medical disorders such as metabolic syndrome, cardiovascular disease and several malignancies including prostate,  breast, and colorectal cancers. Starting goal: Lose 7-10% of starting weight.   7. Vitamin D deficiency Not optimized. Current vitamin D is 31.9, tested on 06/29/2020. Optimal goal > 50 ng/dL.  Kyle Levy is taking vitamin D 5,000 IU daily.  Plan: Continue current OTC vitamin D supplementation.  Will check vitamin D level today.  - VITAMIN D 25 Hydroxy (Vit-D Deficiency, Fractures)  8. Metabolic syndrome Starting goal: Lose 7-10% of starting weight. Kyle Levy will continue to focus on protein-rich, low simple carbohydrate foods. We reviewed the importance of hydration, regular exercise for stress reduction, and restorative sleep.  We will continue to check lab work every 3 months, with 10% weight loss, or should any other concerns arise.  9. Obstructive sleep apnea OSA is a cause of systemic hypertension and is associated with an increased incidence of stroke, heart failure, atrial fibrillation, and coronary heart disease. Severe OSA increases all-cause mortality and  cardiovascular mortality.   Goal: Treatment of OSA via CPAP compliance and weight loss. . Plasma ghrelin levels (appetite or "hunger hormone") are significantly higher in OSA patients than in BMI-matched controls, but decrease to levels similar to those of obese patients without OSA after CPAP treatment.  . Weight loss improves OSA by several mechanisms, including reduction in fatty tissue in the throat (i.e. parapharyngeal fat) and the tongue. Loss of abdominal fat increases mediastinal traction on the upper airway making it less likely to collapse during sleep. . Studies have also shown that compliance with CPAP treatment improves leptin (hunger inhibitory hormone) imbalance.  - CBC with Differential/Platelet  10. Anxiety Kyle Levy is taking Buspar 10 mg three times daily as needed for anxiety.  Kyle Levy is also on Lexapro 20 mg daily.  Plan:  Continue medications as prescribed.  Behavior modification techniques were discussed today to help Kyle Levy deal  with his anxiety.    11. At risk for nausea Kyle Levy was given approximately 8 minutes of nausea prevention counseling today. Kyle Levy is at risk for nausea due to his new or current medication. Kyle Levy was encouraged to titrate his medication slowly, make sure to stay hydrated, eat smaller portions throughout the day, and avoid high fat meals.   12. 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 As such, his goal is to continue with weight loss efforts.   Nutrition goals: Kyle Levy has agreed to the Category 4 Plan.   Exercise goals: For substantial health benefits, adults should do at least 150 minutes (2 hours and 30 minutes) a week of moderate-intensity, or 75 minutes (1 hour and 15 minutes) a week of vigorous-intensity aerobic physical activity, or an equivalent combination of moderate- and vigorous-intensity aerobic activity. Aerobic activity should be performed in episodes of at least 10 minutes, and preferably, it should be spread throughout  the week.  Behavioral modification strategies: increasing lean protein intake, decreasing simple carbohydrates, increasing vegetables and increasing water intake.  Cobie has agreed to follow-up with our clinic in 2 weeks. Kyle Levy was informed of the importance of frequent follow-up visits to maximize his success with intensive lifestyle modifications for his multiple health conditions.   Objective:   Blood pressure (!) 148/90, pulse 69, temperature 98 F (36.7 C), temperature source Oral, height 5\' 11"  (1.803 m), weight (!) 390 lb (176.9 kg), SpO2 97 %. Body mass index is 54.39 kg/m.  General: Cooperative, alert, well developed, in no acute distress. HEENT: Conjunctivae and lids unremarkable. Cardiovascular: Regular rhythm.  Lungs: Normal work of breathing. Neurologic: No focal deficits.   Lab Results  Component  Value Date   CREATININE 0.80 06/29/2020   BUN 13 06/29/2020   NA 138 06/29/2020   K 4.1 06/29/2020   CL 99 06/29/2020   CO2 22 06/29/2020   Lab Results  Component Value Date   ALT 35 06/29/2020   AST 26 06/29/2020   ALKPHOS 103 06/29/2020   BILITOT 0.6 06/29/2020   Lab Results  Component Value Date   HGBA1C 5.9 (H) 06/29/2020   Lab Results  Component Value Date   INSULIN 34.2 (H) 06/29/2020   Lab Results  Component Value Date   TSH 1.810 06/29/2020   Lab Results  Component Value Date   CHOL 183 06/29/2020   HDL 32 (L) 06/29/2020   LDLCALC 114 (H) 06/29/2020   LDLDIRECT 85.0 04/10/2016   TRIG 212 (H) 06/29/2020   CHOLHDL 4 05/28/2018   Lab Results  Component Value Date   WBC 6.6 06/29/2020   HGB 14.7 06/29/2020   HCT 43.3 06/29/2020   MCV 84 06/29/2020   PLT 330 06/29/2020   Lab Results  Component Value Date   IRON 83 06/29/2020   TIBC 293 06/29/2020   FERRITIN 132 06/29/2020   Attestation Statements:   Reviewed by clinician on day of visit: allergies, medications, problem list, medical history, surgical history, family history, social history, and previous encounter notes.  I, 08/29/2020, CMA, am acting as transcriptionist for Insurance claims handler, DO  I have reviewed the above documentation for accuracy and completeness, and I agree with the above. Helane Rima, DO

## 2020-11-17 LAB — CBC WITH DIFFERENTIAL/PLATELET
Basophils Absolute: 0.1 10*3/uL (ref 0.0–0.2)
Basos: 1 %
EOS (ABSOLUTE): 0.2 10*3/uL (ref 0.0–0.4)
Eos: 2 %
Hematocrit: 45.5 % (ref 37.5–51.0)
Hemoglobin: 15.1 g/dL (ref 13.0–17.7)
Immature Grans (Abs): 0 10*3/uL (ref 0.0–0.1)
Immature Granulocytes: 0 %
Lymphocytes Absolute: 1.6 10*3/uL (ref 0.7–3.1)
Lymphs: 22 %
MCH: 27.9 pg (ref 26.6–33.0)
MCHC: 33.2 g/dL (ref 31.5–35.7)
MCV: 84 fL (ref 79–97)
Monocytes Absolute: 0.4 10*3/uL (ref 0.1–0.9)
Monocytes: 6 %
Neutrophils Absolute: 4.7 10*3/uL (ref 1.4–7.0)
Neutrophils: 69 %
Platelets: 345 10*3/uL (ref 150–450)
RBC: 5.42 x10E6/uL (ref 4.14–5.80)
RDW: 13.9 % (ref 11.6–15.4)
WBC: 6.9 10*3/uL (ref 3.4–10.8)

## 2020-11-17 LAB — LIPID PANEL
Chol/HDL Ratio: 5 ratio (ref 0.0–5.0)
Cholesterol, Total: 169 mg/dL (ref 100–199)
HDL: 34 mg/dL — ABNORMAL LOW (ref >39)
LDL Chol Calc (NIH): 106 mg/dL — ABNORMAL HIGH (ref 0–99)
Triglycerides: 161 mg/dL — ABNORMAL HIGH (ref 0–149)
VLDL Cholesterol Cal: 29 mg/dL (ref 5–40)

## 2020-11-17 LAB — COMPREHENSIVE METABOLIC PANEL
ALT: 27 IU/L (ref 0–44)
AST: 19 IU/L (ref 0–40)
Albumin/Globulin Ratio: 1.5 (ref 1.2–2.2)
Albumin: 4.7 g/dL (ref 4.0–5.0)
Alkaline Phosphatase: 116 IU/L (ref 44–121)
BUN/Creatinine Ratio: 15 (ref 9–20)
BUN: 12 mg/dL (ref 6–24)
Bilirubin Total: 0.6 mg/dL (ref 0.0–1.2)
CO2: 23 mmol/L (ref 20–29)
Calcium: 9.3 mg/dL (ref 8.7–10.2)
Chloride: 98 mmol/L (ref 96–106)
Creatinine, Ser: 0.8 mg/dL (ref 0.76–1.27)
GFR calc Af Amer: 126 mL/min/{1.73_m2} (ref >59)
GFR calc non Af Amer: 109 mL/min/{1.73_m2} (ref >59)
Globulin, Total: 3.2 g/dL (ref 1.5–4.5)
Glucose: 99 mg/dL (ref 65–99)
Potassium: 4.1 mmol/L (ref 3.5–5.2)
Sodium: 137 mmol/L (ref 134–144)
Total Protein: 7.9 g/dL (ref 6.0–8.5)

## 2020-11-17 LAB — VITAMIN D 25 HYDROXY (VIT D DEFICIENCY, FRACTURES): Vit D, 25-Hydroxy: 39.5 ng/mL (ref 30.0–100.0)

## 2020-11-17 LAB — HEMOGLOBIN A1C
Est. average glucose Bld gHb Est-mCnc: 120 mg/dL
Hgb A1c MFr Bld: 5.8 % — ABNORMAL HIGH (ref 4.8–5.6)

## 2020-11-18 ENCOUNTER — Encounter (INDEPENDENT_AMBULATORY_CARE_PROVIDER_SITE_OTHER): Payer: Self-pay | Admitting: Family Medicine

## 2020-11-24 NOTE — Telephone Encounter (Signed)
Last OV with Dr Wallace 

## 2020-11-28 NOTE — Telephone Encounter (Signed)
Key: [id number]

## 2020-11-30 ENCOUNTER — Other Ambulatory Visit (INDEPENDENT_AMBULATORY_CARE_PROVIDER_SITE_OTHER): Payer: Self-pay | Admitting: Physician Assistant

## 2020-11-30 ENCOUNTER — Other Ambulatory Visit: Payer: Self-pay

## 2020-11-30 ENCOUNTER — Encounter (INDEPENDENT_AMBULATORY_CARE_PROVIDER_SITE_OTHER): Payer: Self-pay | Admitting: Physician Assistant

## 2020-11-30 ENCOUNTER — Ambulatory Visit (INDEPENDENT_AMBULATORY_CARE_PROVIDER_SITE_OTHER): Payer: Commercial Managed Care - PPO | Admitting: Physician Assistant

## 2020-11-30 VITALS — BP 139/86 | HR 72 | Temp 98.0°F | Ht 71.0 in | Wt 392.0 lb

## 2020-11-30 DIAGNOSIS — Z9189 Other specified personal risk factors, not elsewhere classified: Secondary | ICD-10-CM

## 2020-11-30 DIAGNOSIS — Z6841 Body Mass Index (BMI) 40.0 and over, adult: Secondary | ICD-10-CM | POA: Diagnosis not present

## 2020-11-30 DIAGNOSIS — R7303 Prediabetes: Secondary | ICD-10-CM

## 2020-11-30 DIAGNOSIS — E782 Mixed hyperlipidemia: Secondary | ICD-10-CM | POA: Diagnosis not present

## 2020-11-30 MED ORDER — OZEMPIC (0.25 OR 0.5 MG/DOSE) 2 MG/1.5ML ~~LOC~~ SOPN: 0.2500 mg | PEN_INJECTOR | SUBCUTANEOUS | 0 refills | Status: DC

## 2020-11-30 MED FILL — OZEMPIC 0.25 OR 0.5 MG/DOSE: 2 | 30 days supply | Qty: 2 | Fill #0

## 2020-12-06 ENCOUNTER — Encounter (INDEPENDENT_AMBULATORY_CARE_PROVIDER_SITE_OTHER): Payer: Self-pay

## 2020-12-06 NOTE — Progress Notes (Signed)
Chief Complaint:   OBESITY Kyle Levy is here to discuss his progress with his obesity treatment plan along with follow-up of his obesity related diagnoses. Kyle Levy is on the Category 4 Plan and states he is following his eating plan approximately 35-40% of the time. Kyle Levy states he is walking for 30+ minutes 2-3 times per week.  Today's visit was #: 7 Starting weight: 393 lbs Starting date: 06/29/2020 Today's weight: 392 lbs Today's date: 11/30/2020 Total lbs lost to date: 1 Total lbs lost since last in-office visit: 0  Interim History: Kyle Levy reports that he has no issues with the plan. He craves sugar and finds that he overeats his snacks. He is frustrated that [place] was not approved.  Subjective:   1. Pre-diabetes Kyle Levy is not on medications, and he reports cravings. Last A1c was 5.8.  2. Mixed hyperlipidemia Kyle Levy is on atorvastatin. Last lipid panel was not at goal.  3. At risk for diabetes mellitus Kyle Levy is at higher than average risk for developing diabetes due to obesity.   Assessment/Plan:   1. Pre-diabetes Kyle Levy agreed to start Ozempic 0.25 mg q weekly with no refills. He will continue to work on weight loss, exercise, and decreasing simple carbohydrates to help decrease the risk of diabetes.   - Semaglutide,0.25 or 0.5MG /DOS, (OZEMPIC, 0.25 OR 0.5 MG/DOSE,) 2 MG/1.5ML SOPN; Inject 0.25 mg into the skin once a week.  Dispense: 1.5 mL; Refill: 0  2. Mixed hyperlipidemia Cardiovascular risk and specific lipid/LDL goals reviewed. We discussed several lifestyle modifications today. Kyle Levy will continue his medications, and meal plan, and will continue to work on exercise and weight loss efforts. Orders and follow up as documented in patient record.   Counseling Intensive lifestyle modifications are the first line treatment for this issue. . Dietary changes: Increase soluble fiber. Decrease simple carbohydrates. . Exercise changes: Moderate to vigorous-intensity aerobic  activity 150 minutes per week if tolerated. . Lipid-lowering medications: see documented in medical record.  3. At risk for diabetes mellitus Kyle Levy was given approximately 15 minutes of diabetes education and counseling today. We discussed intensive lifestyle modifications today with an emphasis on weight loss as well as increasing exercise and decreasing simple carbohydrates in his diet. We also reviewed medication options with an emphasis on risk versus benefit of those discussed.   Repetitive spaced learning was employed today to elicit superior memory formation and behavioral change.  4. Class 3 severe obesity with serious comorbidity and body mass index (BMI) of 50.0 to 59.9 in adult, unspecified obesity type (HCC) Kyle Levy is currently in the action stage of change. As such, his goal is to continue with weight loss efforts. He has agreed to the Category 4 Plan.   Better "sweet tooth" options were given today.  Exercise goals: As is.  Behavioral modification strategies: keeping healthy foods in the home and better snacking choices.  Kyle Levy has agreed to follow-up with our clinic in 2 weeks. He was informed of the importance of frequent follow-up visits to maximize his success with intensive lifestyle modifications for his multiple health conditions.   Objective:   Blood pressure 139/86, pulse 72, temperature 98 F (36.7 C), height 5\' 11"  (1.803 m), weight (!) 392 lb (177.8 kg), SpO2 97 %. Body mass index is 54.67 kg/m.  General: Cooperative, alert, well developed, in no acute distress. HEENT: Conjunctivae and lids unremarkable. Cardiovascular: Regular rhythm.  Lungs: Normal work of breathing. Neurologic: No focal deficits.   Lab Results  Component Value Date  CREATININE 0.80 11/16/2020   BUN 12 11/16/2020   NA 137 11/16/2020   K 4.1 11/16/2020   CL 98 11/16/2020   CO2 23 11/16/2020   Lab Results  Component Value Date   ALT 27 11/16/2020   AST 19 11/16/2020   ALKPHOS 116  11/16/2020   BILITOT 0.6 11/16/2020   Lab Results  Component Value Date   HGBA1C 5.8 (H) 11/16/2020   HGBA1C 5.9 (H) 06/29/2020   Lab Results  Component Value Date   INSULIN 34.2 (H) 06/29/2020   Lab Results  Component Value Date   TSH 1.810 06/29/2020   Lab Results  Component Value Date   CHOL 169 11/16/2020   HDL 34 (L) 11/16/2020   LDLCALC 106 (H) 11/16/2020   LDLDIRECT 85.0 04/10/2016   TRIG 161 (H) 11/16/2020   CHOLHDL 5.0 11/16/2020   Lab Results  Component Value Date   WBC 6.9 11/16/2020   HGB 15.1 11/16/2020   HCT 45.5 11/16/2020   MCV 84 11/16/2020   PLT 345 11/16/2020   Lab Results  Component Value Date   IRON 83 06/29/2020   TIBC 293 06/29/2020   FERRITIN 132 06/29/2020   Attestation Statements:   Reviewed by clinician on day of visit: allergies, medications, problem list, medical history, surgical history, family history, social history, and previous encounter notes.   Trude Mcburney, am acting as transcriptionist for [employer], PA-C.  I have reviewed the above documentation for accuracy and completeness, and I agree with the above. -  *Alois Cliche, PA-C

## 2020-12-12 ENCOUNTER — Other Ambulatory Visit: Payer: Self-pay

## 2020-12-12 ENCOUNTER — Encounter: Payer: Self-pay | Admitting: Internal Medicine

## 2020-12-12 MED ORDER — AMLODIPINE BESYLATE 10 MG PO TABS: 10.0000 mg | ORAL_TABLET | Freq: Every day | ORAL | 3 refills | Status: DC

## 2020-12-12 MED ORDER — LOSARTAN POTASSIUM 100 MG PO TABS: 100.0000 mg | ORAL_TABLET | Freq: Every day | ORAL | 3 refills | Status: DC

## 2020-12-12 MED ORDER — ATORVASTATIN CALCIUM 20 MG PO TABS: 20.0000 mg | ORAL_TABLET | Freq: Every day | ORAL | 3 refills | Status: DC

## 2020-12-14 ENCOUNTER — Ambulatory Visit (INDEPENDENT_AMBULATORY_CARE_PROVIDER_SITE_OTHER): Payer: No Typology Code available for payment source | Admitting: Family Medicine

## 2020-12-15 ENCOUNTER — Ambulatory Visit (INDEPENDENT_AMBULATORY_CARE_PROVIDER_SITE_OTHER): Payer: Commercial Managed Care - PPO | Admitting: Family Medicine

## 2020-12-15 ENCOUNTER — Encounter (INDEPENDENT_AMBULATORY_CARE_PROVIDER_SITE_OTHER): Payer: Self-pay | Admitting: Family Medicine

## 2020-12-15 ENCOUNTER — Other Ambulatory Visit (INDEPENDENT_AMBULATORY_CARE_PROVIDER_SITE_OTHER): Payer: Self-pay | Admitting: Family Medicine

## 2020-12-15 ENCOUNTER — Other Ambulatory Visit: Payer: Self-pay

## 2020-12-15 VITALS — BP 146/87 | HR 69 | Temp 98.1°F | Ht 71.0 in | Wt 387.0 lb

## 2020-12-15 DIAGNOSIS — R7303 Prediabetes: Secondary | ICD-10-CM | POA: Diagnosis not present

## 2020-12-15 DIAGNOSIS — I10 Essential (primary) hypertension: Secondary | ICD-10-CM | POA: Diagnosis not present

## 2020-12-15 DIAGNOSIS — Z9189 Other specified personal risk factors, not elsewhere classified: Secondary | ICD-10-CM

## 2020-12-15 DIAGNOSIS — Z6841 Body Mass Index (BMI) 40.0 and over, adult: Secondary | ICD-10-CM

## 2020-12-15 MED ORDER — OZEMPIC (0.25 OR 0.5 MG/DOSE) 2 MG/1.5ML ~~LOC~~ SOPN: 0.2500 mg | PEN_INJECTOR | SUBCUTANEOUS | 0 refills | Status: DC

## 2020-12-20 NOTE — Progress Notes (Signed)
Chief Complaint:   OBESITY Kyle Levy is here to discuss his progress with his obesity treatment plan along with follow-up of his obesity related diagnoses. Kyle Levy is on the Category 4 Plan and states he is following his eating plan approximately 75% of the time. Kyle Levy states he is walking and bike riding for 20-30 minutes 2-3 times per week.  Today's visit was #: 8 Starting weight: 393 lbs Starting date: 06/29/2020 Today's weight: 387 lbs Today's date: 12/15/2020 Total lbs lost to date: 6 Total lbs lost since last in-office visit: 5  Interim History: Kyle Levy continues to do well with weight loss on his plan. He is staying active. He is doing better at following his plan, and he is making better snacking choices. He is still dealing with some sabotage (unintentionally) from his family.  Subjective:   1. Pre-diabetes Kyle Levy is stable on Ozempic, and he is tolerating it well. He denies nausea or vomiting, and he is doing better with diet and exercise too.  2. Essential hypertension Kyle Levy's blood pressure is still above goal, and he is not checking his BGs at home. It has improved since he has started working on diet and exercise.  3. At risk for heart disease Kyle Levy is at a higher than average risk for cardiovascular disease due to obesity.   Assessment/Plan:   1. Pre-diabetes Kyle Levy will continue to work on weight loss, exercise, and decreasing simple carbohydrates to help decrease the risk of diabetes. We will refill Ozempic for 1 month.  - Semaglutide,0.25 or 0.5MG /DOS, (OZEMPIC, 0.25 OR 0.5 MG/DOSE,) 2 MG/1.5ML SOPN; Inject 0.25 mg into the skin once a week.  Dispense: 1.5 mL; Refill: 0  2. Essential hypertension Kyle Levy will continue his medications, may consider adding hydrochlorothiazide in the future. He will continue working on healthy weight loss and exercise to improve blood pressure control. We will continue to monitor closely as he continues his lifestyle modifications.  3. At  risk for heart disease Kyle Levy was given approximately 15 minutes of coronary artery disease prevention counseling today. He is 45 y.o. male and has risk factors for heart disease including obesity. We discussed intensive lifestyle modifications today with an emphasis on specific weight loss instructions and strategies.   Repetitive spaced learning was employed today to elicit superior memory formation and behavioral change.  4. Obesity with current BMI of 54.0 Kyle Levy is currently in the action stage of change. As such, his goal is to continue with weight loss efforts. He has agreed to the Category 4 Plan.   Exercise goals: As is.  Behavioral modification strategies: better snacking choices and dealing with family or coworker sabotage.  Kyle Levy has agreed to follow-up with our clinic in 2 weeks. He was informed of the importance of frequent follow-up visits to maximize his success with intensive lifestyle modifications for his multiple health conditions.   Objective:   Blood pressure (!) 146/87, pulse 69, temperature 98.1 F (36.7 C), height 5\' 11"  (1.803 m), weight (!) 387 lb (175.5 kg), SpO2 97 %. Body mass index is 53.98 kg/m.  General: Cooperative, alert, well developed, in no acute distress. HEENT: Conjunctivae and lids unremarkable. Cardiovascular: Regular rhythm.  Lungs: Normal work of breathing. Neurologic: No focal deficits.   Lab Results  Component Value Date   CREATININE 0.80 11/16/2020   BUN 12 11/16/2020   NA 137 11/16/2020   K 4.1 11/16/2020   CL 98 11/16/2020   CO2 23 11/16/2020   Lab Results  Component Value Date  ALT 27 11/16/2020   AST 19 11/16/2020   ALKPHOS 116 11/16/2020   BILITOT 0.6 11/16/2020   Lab Results  Component Value Date   HGBA1C 5.8 (H) 11/16/2020   HGBA1C 5.9 (H) 06/29/2020   Lab Results  Component Value Date   INSULIN 34.2 (H) 06/29/2020   Lab Results  Component Value Date   TSH 1.810 06/29/2020   Lab Results  Component Value Date    CHOL 169 11/16/2020   HDL 34 (L) 11/16/2020   LDLCALC 106 (H) 11/16/2020   LDLDIRECT 85.0 04/10/2016   TRIG 161 (H) 11/16/2020   CHOLHDL 5.0 11/16/2020   Lab Results  Component Value Date   WBC 6.9 11/16/2020   HGB 15.1 11/16/2020   HCT 45.5 11/16/2020   MCV 84 11/16/2020   PLT 345 11/16/2020   Lab Results  Component Value Date   IRON 83 06/29/2020   TIBC 293 06/29/2020   FERRITIN 132 06/29/2020   Attestation Statements:   Reviewed by clinician on day of visit: allergies, medications, problem list, medical history, surgical history, family history, social history, and previous encounter notes.   I, Burt Knack, am acting as transcriptionist for Quillian Quince, MD.  I have reviewed the above documentation for accuracy and completeness, and I agree with the above. -  Quillian Quince, MD

## 2020-12-28 ENCOUNTER — Ambulatory Visit (INDEPENDENT_AMBULATORY_CARE_PROVIDER_SITE_OTHER): Payer: Commercial Managed Care - PPO | Admitting: Physician Assistant

## 2020-12-28 ENCOUNTER — Other Ambulatory Visit: Payer: Self-pay

## 2020-12-28 ENCOUNTER — Encounter (INDEPENDENT_AMBULATORY_CARE_PROVIDER_SITE_OTHER): Payer: Self-pay | Admitting: Physician Assistant

## 2020-12-28 VITALS — BP 131/76 | HR 76 | Temp 97.6°F | Ht 71.0 in | Wt 386.0 lb

## 2020-12-28 DIAGNOSIS — Z6841 Body Mass Index (BMI) 40.0 and over, adult: Secondary | ICD-10-CM | POA: Diagnosis not present

## 2020-12-28 DIAGNOSIS — E782 Mixed hyperlipidemia: Secondary | ICD-10-CM

## 2020-12-29 ENCOUNTER — Other Ambulatory Visit (HOSPITAL_COMMUNITY): Payer: Self-pay

## 2021-01-04 NOTE — Progress Notes (Signed)
Chief Complaint:   OBESITY Kyle Levy is here to discuss his progress with his obesity treatment plan along with follow-up of his obesity related diagnoses. Kyle Levy is on the Category 4 Plan and states he is following his eating plan approximately 60-70% of the time. Kyle Levy states he is walking and riding his bike fo 30 minutes 3 times per week.  Today's visit was #: 9 Starting weight: 393 lbs Starting date: 06/29/2020 Today's weight: 286 lbs Today's date: 12/28/2020 Total lbs lost to date: 7 lbs Total lbs lost since last in-office visit: 1 lb  Interim History: Kyle Levy is not excessively hungry between meals and is portion controlling well with Ozempic 0.25 mg. He is also journaling his category and is averaging 2,000 calories.  Subjective:   1. Mixed hyperlipidemia Kyle Levy is on atorvastatin. Last lipid panel was not at goal. Patient denies myalgias.   Lab Results  Component Value Date   CHOL 169 11/16/2020   HDL 34 (L) 11/16/2020   LDLCALC 106 (H) 11/16/2020   LDLDIRECT 85.0 04/10/2016   TRIG 161 (H) 11/16/2020   CHOLHDL 5.0 11/16/2020   Lab Results  Component Value Date   ALT 27 11/16/2020   AST 19 11/16/2020   ALKPHOS 116 11/16/2020   BILITOT 0.6 11/16/2020   The 10-year ASCVD risk score Denman Kyle Levy., et al., 2013) is: 2.4%   Values used to calculate the score:     Age: 45 years     Sex: Male     Is Non-Hispanic African American: No     Diabetic: No     Tobacco smoker: No     Systolic Blood Pressure: 131 mmHg     Is BP treated: Yes     HDL Cholesterol: 34 mg/dL     Total Cholesterol: 169 mg/dL  Assessment/Plan:   1. Mixed hyperlipidemia Cardiovascular risk and specific lipid/LDL goals reviewed.  We discussed several lifestyle modifications today and Kyle Levy will continue to work on diet, exercise and weight loss efforts. Orders and follow up as documented in patient record. Continue with medication and weight loss.  Counseling Intensive lifestyle modifications are  the first line treatment for this issue. . Dietary changes: Increase soluble fiber. Decrease simple carbohydrates. . Exercise changes: Moderate to vigorous-intensity aerobic activity 150 minutes per week if tolerated. . Lipid-lowering medications: see documented in medical record.  2. Obesity with current BMI of 53.9  Kyle Levy is currently in the action stage of change. As such, his goal is to continue with weight loss efforts. He has agreed to the Category 4 Plan.   Exercise goals: For substantial health benefits, adults should do at least 150 minutes (2 hours and 30 minutes) a week of moderate-intensity, or 75 minutes (1 hour and 15 minutes) a week of vigorous-intensity aerobic physical activity, or an equivalent combination of moderate- and vigorous-intensity aerobic activity. Aerobic activity should be performed in episodes of at least 10 minutes, and preferably, it should be spread throughout the week.  Behavioral modification strategies: meal planning and cooking strategies and keeping healthy foods in the home.  Kyle Levy has agreed to follow-up with our clinic in 2 weeks. He was informed of the importance of frequent follow-up visits to maximize his success with intensive lifestyle modifications for his multiple health conditions.   Objective:   Blood pressure 131/76, pulse 76, temperature 97.6 F (36.4 C), height 5\' 11"  (1.803 m), weight (!) 386 lb (175.1 kg), SpO2 95 %. Body mass index is 53.84 kg/m.  General:  Cooperative, alert, well developed, in no acute distress. HEENT: Conjunctivae and lids unremarkable. Cardiovascular: Regular rhythm.  Lungs: Normal work of breathing. Neurologic: No focal deficits.   Lab Results  Component Value Date   CREATININE 0.80 11/16/2020   BUN 12 11/16/2020   NA 137 11/16/2020   K 4.1 11/16/2020   CL 98 11/16/2020   CO2 23 11/16/2020   Lab Results  Component Value Date   ALT 27 11/16/2020   AST 19 11/16/2020   ALKPHOS 116 11/16/2020    BILITOT 0.6 11/16/2020   Lab Results  Component Value Date   HGBA1C 5.8 (H) 11/16/2020   HGBA1C 5.9 (H) 06/29/2020   Lab Results  Component Value Date   INSULIN 34.2 (H) 06/29/2020   Lab Results  Component Value Date   TSH 1.810 06/29/2020   Lab Results  Component Value Date   CHOL 169 11/16/2020   HDL 34 (L) 11/16/2020   LDLCALC 106 (H) 11/16/2020   LDLDIRECT 85.0 04/10/2016   TRIG 161 (H) 11/16/2020   CHOLHDL 5.0 11/16/2020   Lab Results  Component Value Date   WBC 6.9 11/16/2020   HGB 15.1 11/16/2020   HCT 45.5 11/16/2020   MCV 84 11/16/2020   PLT 345 11/16/2020   Lab Results  Component Value Date   IRON 83 06/29/2020   TIBC 293 06/29/2020   FERRITIN 132 06/29/2020   Attestation Statements:   Reviewed by clinician on day of visit: allergies, medications, problem list, medical history, surgical history, family history, social history, and previous encounter notes.  Time spent on visit including pre-visit chart review and post-visit care and charting was 30 minutes.   Kyle Levy, CMA, am acting as Energy manager for [employer], PA-C.  I have reviewed the above documentation for accuracy and completeness, and I agree with the above. Kyle Cliche, PA-C

## 2021-01-12 ENCOUNTER — Other Ambulatory Visit: Payer: Self-pay

## 2021-01-12 ENCOUNTER — Ambulatory Visit (INDEPENDENT_AMBULATORY_CARE_PROVIDER_SITE_OTHER): Payer: Commercial Managed Care - PPO | Admitting: Physician Assistant

## 2021-01-12 ENCOUNTER — Other Ambulatory Visit (HOSPITAL_COMMUNITY): Payer: Self-pay

## 2021-01-12 VITALS — BP 138/91 | HR 69 | Temp 97.9°F | Ht 71.0 in | Wt 384.0 lb

## 2021-01-12 DIAGNOSIS — Z9189 Other specified personal risk factors, not elsewhere classified: Secondary | ICD-10-CM

## 2021-01-12 DIAGNOSIS — R7303 Prediabetes: Secondary | ICD-10-CM | POA: Diagnosis not present

## 2021-01-12 MED ORDER — SEMAGLUTIDE(0.25 OR 0.5MG/DOS) 2 MG/1.5ML ~~LOC~~ SOPN
0.5000 mg | PEN_INJECTOR | SUBCUTANEOUS | 0 refills | Status: DC
  Filled 2021-01-12: qty 1.5, 28d supply, fill #0

## 2021-01-12 MED FILL — Semaglutide Soln Pen-inj 0.25 or 0.5 MG/DOSE (2 MG/1.5ML): SUBCUTANEOUS | 31 days supply | Qty: 1.5 | Fill #0 | Status: CN

## 2021-01-16 NOTE — Progress Notes (Signed)
Chief Complaint:   OBESITY Kyle Levy is here to discuss his progress with his obesity treatment plan along with follow-up of his obesity related diagnoses. Kyle Levy is on the Category 4 Plan and states he is following his eating plan approximately 70-80% of the time. [name] states he is walking, using phone app, and stationary bike 20-30 minutes 3-4 times per week.  Today's visit was #: 10 Starting weight: 393 lbs Starting date: 06/29/2020 Today's weight: 384 lbs Today's date: 01/12/2021 Total lbs lost to date: 9 Total lbs lost since last in-office visit: 2  Interim History: Kyle Levy did well with weight loss. He states that he overindulged in [place] candy and still has it in his house. He does better with snacking when he has [employer].  Subjective:   1. Pre-diabetes Kyle Levy is on Ozempic 0.25 mg. He continues to have some polyphagia.  2. At risk for diabetes mellitus Kyle Levy is at higher than average risk for developing diabetes due to obesity.   Assessment/Plan:   1. Pre-diabetes Kyle Levy will continue to work on weight loss, exercise, and decreasing simple carbohydrates to help decrease the risk of diabetes. Increase Ozempic to 0.5 mg, as prescribed below.  - Semaglutide,0.25 or 0.5MG /DOS, 2 MG/1.5ML SOPN; Inject 0.5 mg into the skin once a week.  Dispense: 1.5 mL; Refill: 0  2. At risk for diabetes mellitus Kyle Levy was given approximately 15 minutes of diabetes education and counseling today. We discussed intensive lifestyle modifications today with an emphasis on weight loss as well as increasing exercise and decreasing simple carbohydrates in his diet. We also reviewed medication options with an emphasis on risk versus benefit of those discussed.   Repetitive spaced learning was employed today to elicit superior memory formation and behavioral change.  3. Morbid obesity BMI 53.6 Kyle Levy is currently in the action stage of change. As such, his goal is to continue with weight loss  efforts. He has agreed to the Category 4 Plan.   Exercise goals: As is  Behavioral modification strategies: decreasing simple carbohydrates and meal planning and cooking strategies.  Kyle Levy has agreed to follow-up with our clinic in 2-3 weeks. He was informed of the importance of frequent follow-up visits to maximize his success with intensive lifestyle modifications for his multiple health conditions.   Objective:   Blood pressure (!) 138/91, pulse 69, temperature 97.9 F (36.6 C), height 5\' 11"  (1.803 m), weight (!) 384 lb (174.2 kg), SpO2 98 %. Body mass index is 53.56 kg/m.  General: Cooperative, alert, well developed, in no acute distress. HEENT: Conjunctivae and lids unremarkable. Cardiovascular: Regular rhythm.  Lungs: Normal work of breathing. Neurologic: No focal deficits.   Lab Results  Component Value Date   CREATININE 0.80 11/16/2020   BUN 12 11/16/2020   NA 137 11/16/2020   K 4.1 11/16/2020   CL 98 11/16/2020   CO2 23 11/16/2020   Lab Results  Component Value Date   ALT 27 11/16/2020   AST 19 11/16/2020   ALKPHOS 116 11/16/2020   BILITOT 0.6 11/16/2020   Lab Results  Component Value Date   HGBA1C 5.8 (H) 11/16/2020   HGBA1C 5.9 (H) 06/29/2020   Lab Results  Component Value Date   INSULIN 34.2 (H) 06/29/2020   Lab Results  Component Value Date   TSH 1.810 06/29/2020   Lab Results  Component Value Date   CHOL 169 11/16/2020   HDL 34 (L) 11/16/2020   LDLCALC 106 (H) 11/16/2020   LDLDIRECT 85.0 04/10/2016  TRIG 161 (H) 11/16/2020   CHOLHDL 5.0 11/16/2020   Lab Results  Component Value Date   WBC 6.9 11/16/2020   HGB 15.1 11/16/2020   HCT 45.5 11/16/2020   MCV 84 11/16/2020   PLT 345 11/16/2020   Lab Results  Component Value Date   IRON 83 06/29/2020   TIBC 293 06/29/2020   FERRITIN 132 06/29/2020    Attestation Statements:   Reviewed by clinician on day of visit: allergies, medications, problem list, medical history, surgical  history, family history, social history, and previous encounter notes.  Kyle Levy, am acting as Energy manager for [employer], PA-C.  I have reviewed the above documentation for accuracy and completeness, and I agree with the above. Kyle Cliche, PA-C

## 2021-01-22 MED FILL — Escitalopram Oxalate Tab 20 MG (Base Equiv): ORAL | 30 days supply | Qty: 30 | Fill #0 | Status: AC

## 2021-01-23 ENCOUNTER — Other Ambulatory Visit (HOSPITAL_COMMUNITY): Payer: Self-pay

## 2021-01-29 IMAGING — DX DG LUMBAR SPINE 2-3V
3 series · 3 of 3 positions shown · non-contrast
Comparison: 10/30/2018

CLINICAL DATA: Left leg pain and low back pain for 3 days

EXAM:
LUMBAR SPINE - 2-3 VIEW

[l-spine ap]
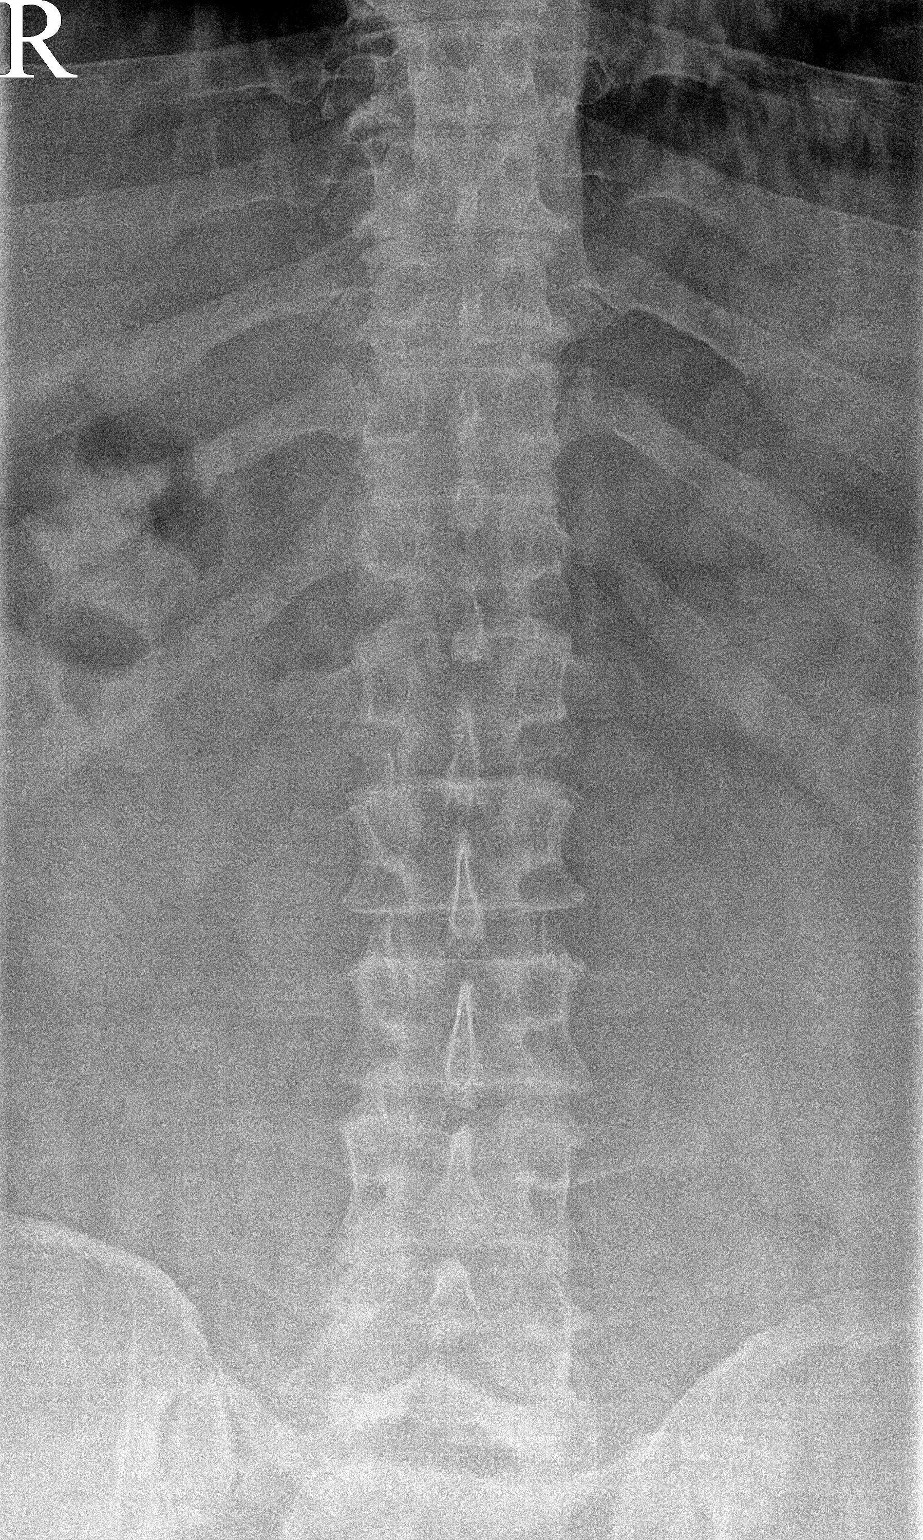

[l-spine lateral (1 of 2)]
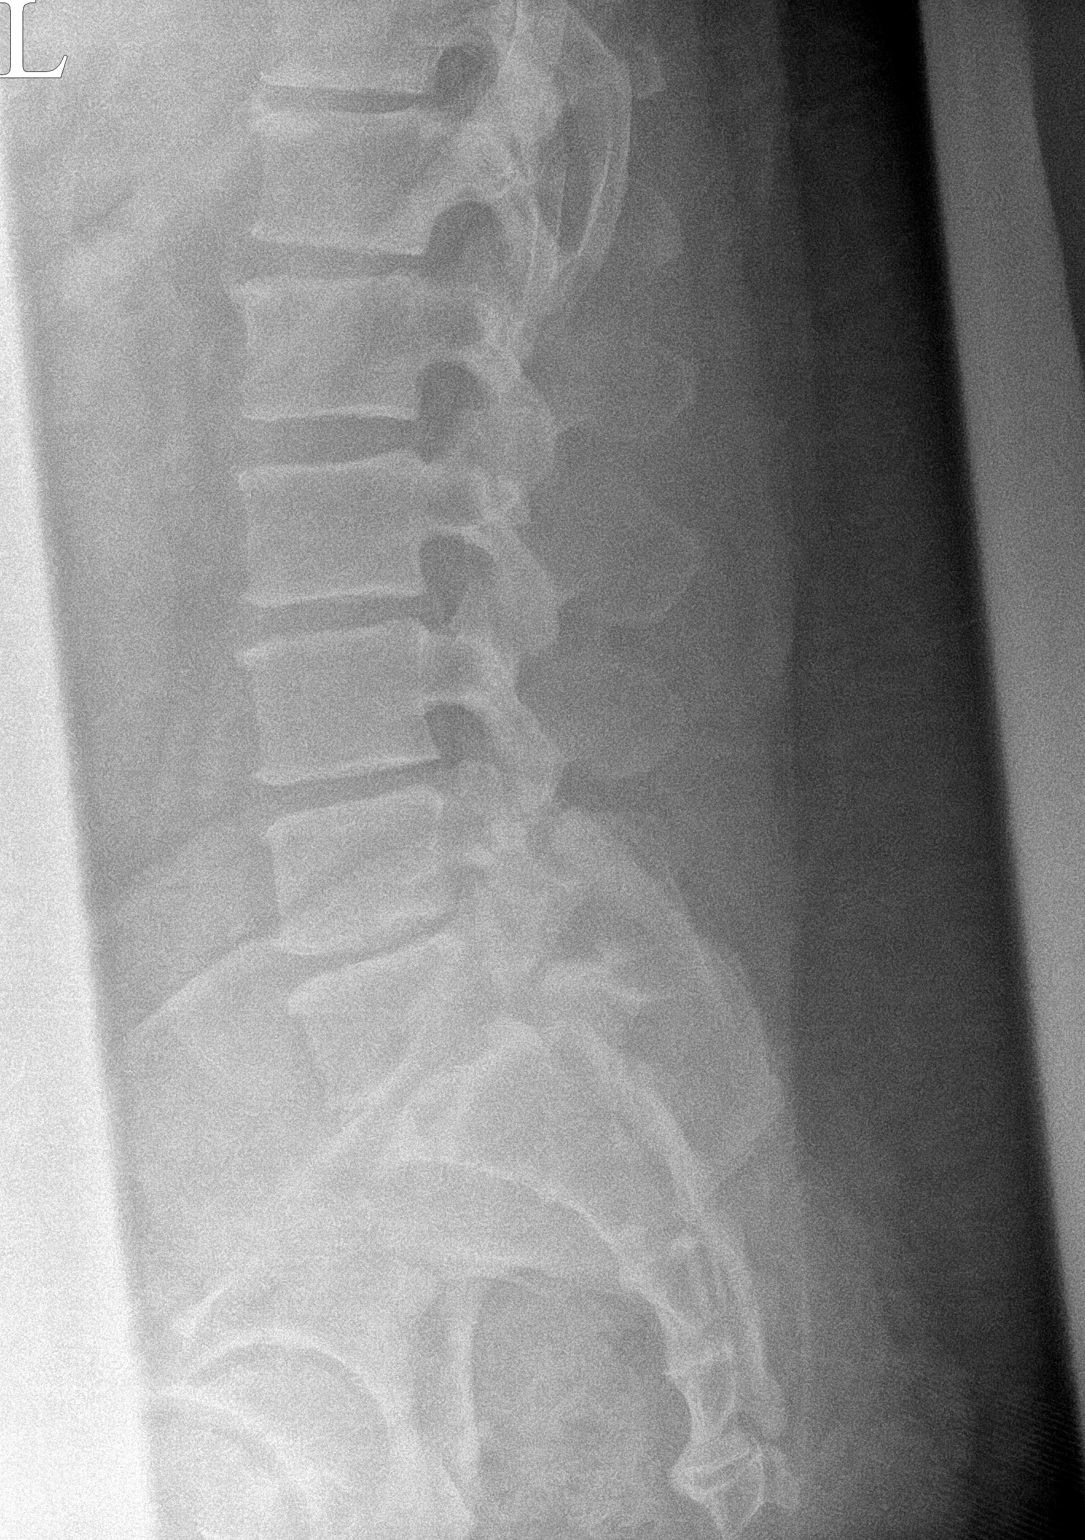

[l-spine lateral (2 of 2)]
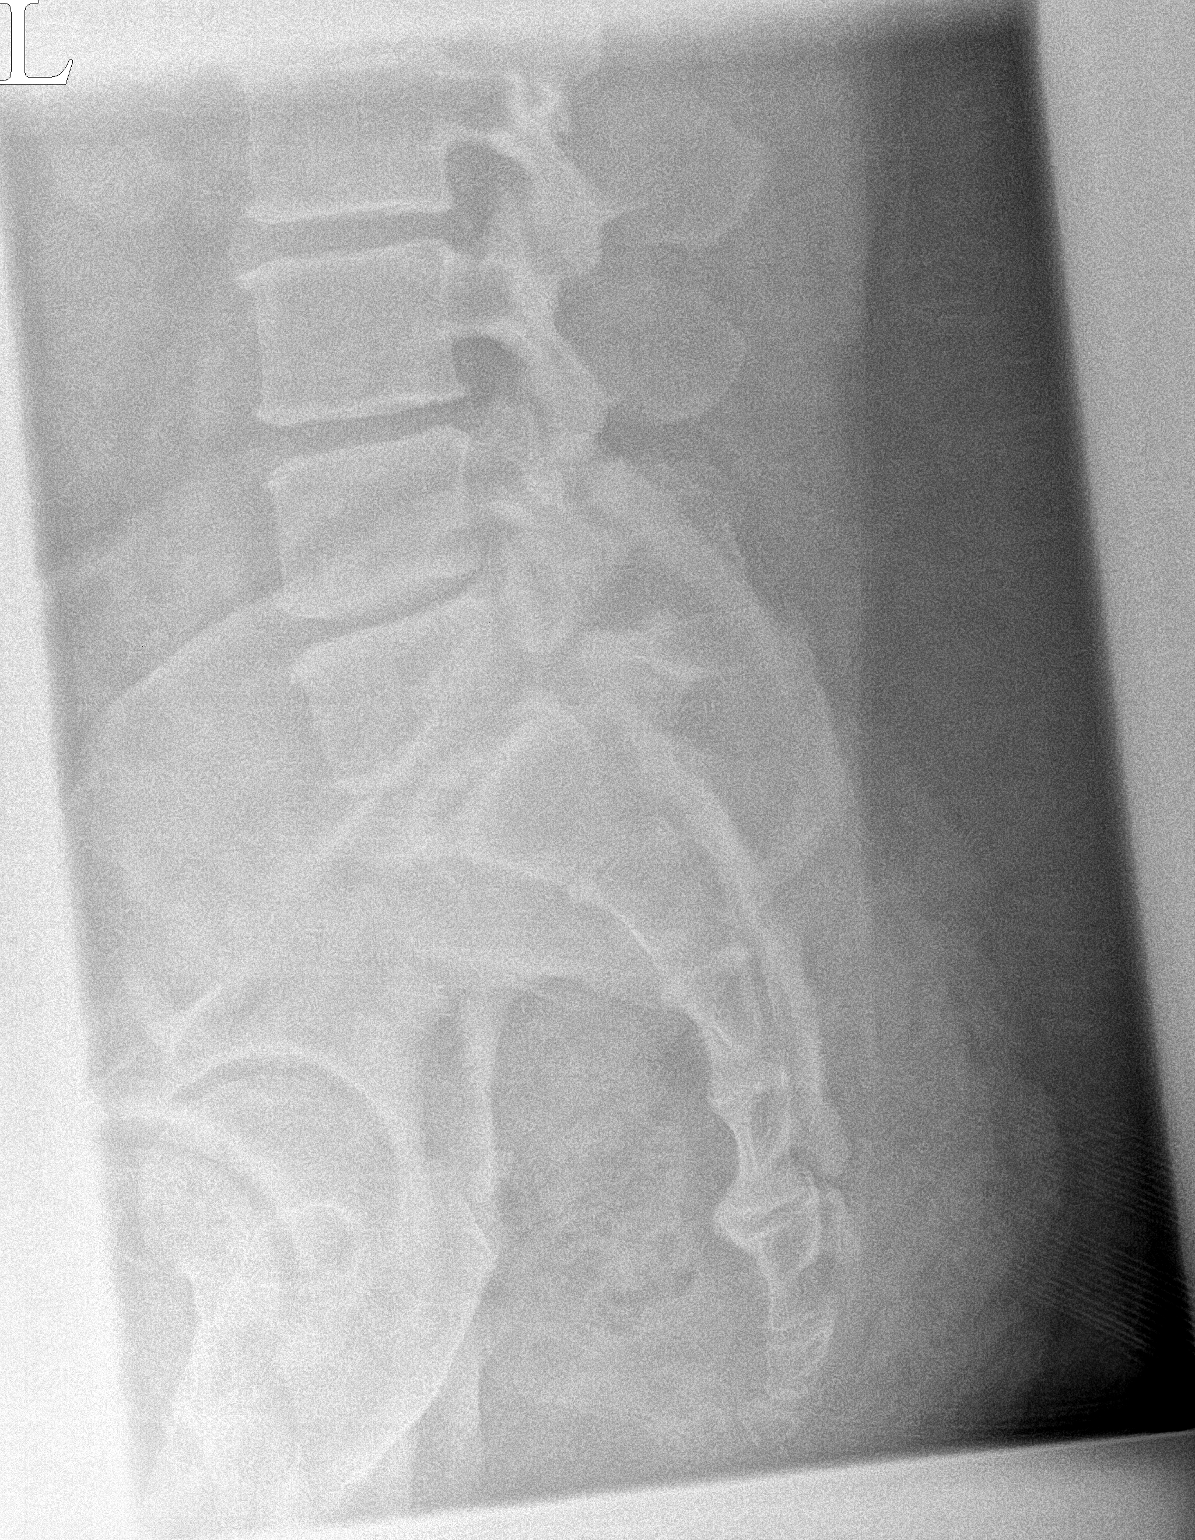

[3 of 3 positions shown; findings below may reference images not displayed]

FINDINGS: Pars interarticularis defects at L5 with approximately 9 mm grade 1
anterolisthesis L5 on S1. Mild intervertebral disc height loss at
L4-5 and L5-S1. Vertebral body heights are maintained without
evidence of acute fracture.
IMPRESSION: 1. Pars interarticularis defects at L5 with approximately 9 mm grade
1 anterolisthesis L5 on S1.
2. Mild degenerative disc disease at L4-5 and L5-S1.

## 2021-02-01 IMAGING — XA Imaging study
2 series · 2 of 2 positions shown · non-contrast
Comparison: none

CLINICAL DATA: Low back and left radicular pain.

[Series 1: ortho adipose · 1 of 1 slices shown (1 of 2)]
[im 1/1]
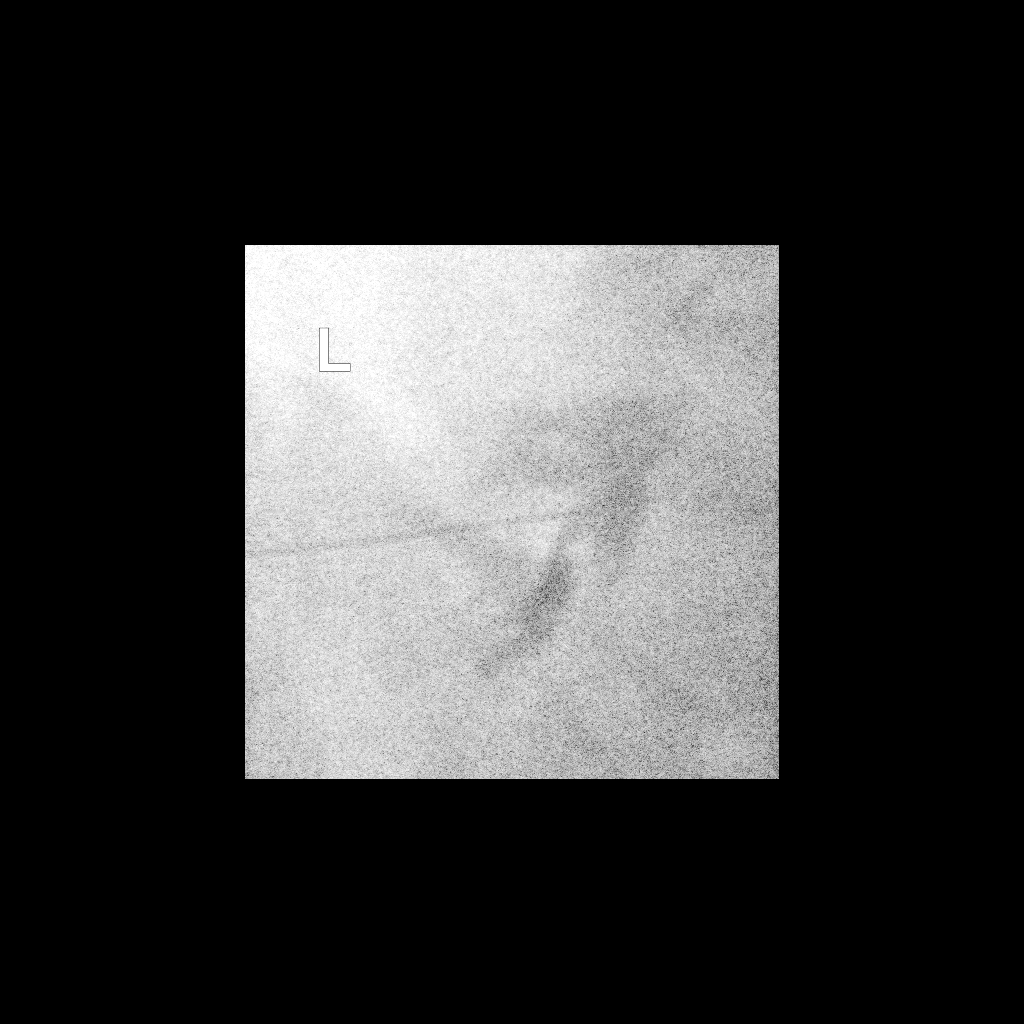

[Series 2: ortho adipose · 1 of 1 slices shown (2 of 2)]
[im 1/1]
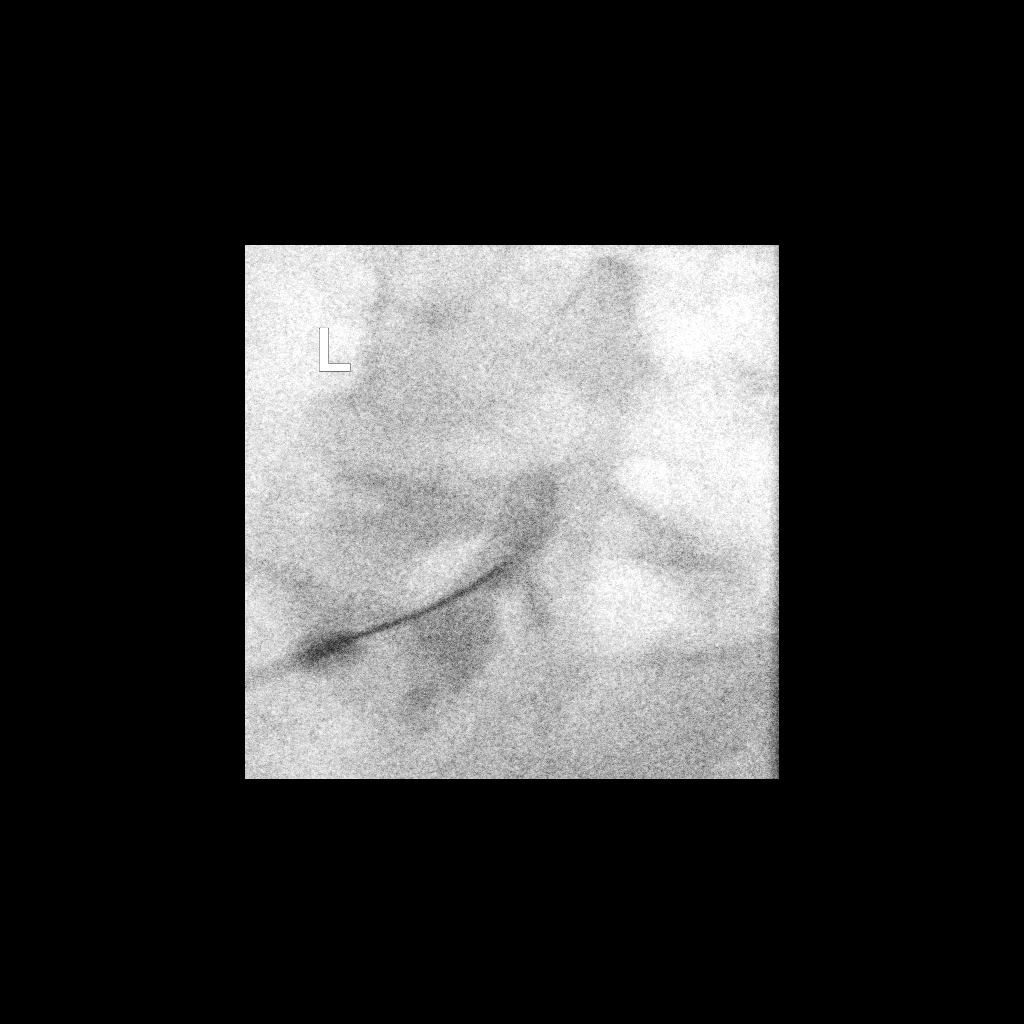

[2 of 2 positions shown; findings below may reference images not displayed]

FLUOROSCOPY TIME:  0 minutes 28 seconds. 56.99 micro gray meter
squared

PROCEDURE:
The procedure, risks, benefits, and alternatives were explained to
the patient. Questions regarding the procedure were encouraged and
answered. The patient understands and consents to the procedure.

LUMBAR EPIDURAL INJECTION:

An interlaminar approach was performed on the left at L5-S1. The
overlying skin was cleansed and anesthetized. A 20 gauge epidural
needle was advanced using loss-of-resistance technique.

DIAGNOSTIC EPIDURAL INJECTION:

Injection of Isovue-M 200 shows a good epidural pattern with spread
above and below the level of needle placement, primarily on the
left. No vascular opacification is seen.

THERAPEUTIC EPIDURAL INJECTION:

One hundred twenty mg of Depo-Medrol mixed with 2.5 cc 1% lidocaine
were instilled. The procedure was well-tolerated, and the patient
was discharged thirty minutes following the injection in good
condition.

COMPLICATIONS:
None
IMPRESSION: Technically successful epidural injection on the left at L5-S1 # 1.

## 2021-02-02 ENCOUNTER — Other Ambulatory Visit: Payer: Self-pay

## 2021-02-02 ENCOUNTER — Encounter (INDEPENDENT_AMBULATORY_CARE_PROVIDER_SITE_OTHER): Payer: Self-pay | Admitting: Physician Assistant

## 2021-02-02 ENCOUNTER — Ambulatory Visit (INDEPENDENT_AMBULATORY_CARE_PROVIDER_SITE_OTHER): Payer: Commercial Managed Care - PPO | Admitting: Physician Assistant

## 2021-02-02 VITALS — BP 148/88 | HR 70 | Temp 98.4°F | Ht 71.0 in | Wt 379.0 lb

## 2021-02-02 DIAGNOSIS — I10 Essential (primary) hypertension: Secondary | ICD-10-CM

## 2021-02-02 DIAGNOSIS — Z6841 Body Mass Index (BMI) 40.0 and over, adult: Secondary | ICD-10-CM | POA: Diagnosis not present

## 2021-02-02 NOTE — Progress Notes (Signed)
Chief Complaint:   OBESITY Kyle Levy here to discuss his progress with his obesity treatment plan along with follow-up of his obesity related diagnoses. Kyle Levy Levy on the Category 4 Plan and states he Levy following his eating plan approximately 90% of the time. Kyle Levy states he Levy walking for 30 minutes 2-3 times per week.  Today's visit was #: 11 Starting weight: 393 lbs Starting date: 06/29/2020 Today's weight: 379 lbs Today's date: 02/02/2021 Total lbs lost to date: 14 Total lbs lost since last in-office visit: 5  Interim History: Kyle Levy did very well with weight loss with the increase of Ozempic to 0.5 mg. His appetite Levy much better controlled. He Levy averaging 2,500 calories daily.  Subjective:   1. Essential hypertension Kyle Levy Levy on Norvasc and Cozaar, and he Levy taking them as prescribed. His blood pressure Levy slightly elevated. He Levy asymptomatic.  Assessment/Plan:   1. Essential hypertension Kyle Levy will continue with his medications, healthy weight loss, and exercise to improve blood pressure control. We will watch for signs of hypotension as he continues his lifestyle modifications.  2. Class 3 severe obesity with serious comorbidity and body mass index (BMI) of 50.0 to 59.9 in adult, unspecified obesity type (HCC) Kyle Levy Levy currently in the action stage of change. As such, his goal Levy to continue with weight loss efforts. He has agreed to the Category 4 Plan.   We will recheck fasting labs at his next visit.  Exercise goals: As Levy.  Behavioral modification strategies: meal planning and cooking strategies and keeping healthy foods in the home.  Kyle Levy has agreed to follow-up with our clinic in 3 weeks. He was informed of the importance of frequent follow-up visits to maximize his success with intensive lifestyle modifications for his multiple health conditions.   Objective:   Blood pressure (!) 148/88, pulse 70, temperature 98.4 F (36.9 C), height 5\' 11"  (1.803 m),  weight (!) 379 lb (171.9 kg), SpO2 96 %. Body mass index Levy 52.86 kg/m.  General: Cooperative, alert, well developed, in no acute distress. HEENT: Conjunctivae and lids unremarkable. Cardiovascular: Regular rhythm.  Lungs: Normal work of breathing. Neurologic: No focal deficits.   Lab Results  Component Value Date   CREATININE 0.80 11/16/2020   BUN 12 11/16/2020   NA 137 11/16/2020   K 4.1 11/16/2020   CL 98 11/16/2020   CO2 23 11/16/2020   Lab Results  Component Value Date   ALT 27 11/16/2020   AST 19 11/16/2020   ALKPHOS 116 11/16/2020   BILITOT 0.6 11/16/2020   Lab Results  Component Value Date   HGBA1C 5.8 (H) 11/16/2020   HGBA1C 5.9 (H) 06/29/2020   Lab Results  Component Value Date   INSULIN 34.2 (H) 06/29/2020   Lab Results  Component Value Date   TSH 1.810 06/29/2020   Lab Results  Component Value Date   CHOL 169 11/16/2020   HDL 34 (L) 11/16/2020   LDLCALC 106 (H) 11/16/2020   LDLDIRECT 85.0 04/10/2016   TRIG 161 (H) 11/16/2020   CHOLHDL 5.0 11/16/2020   Lab Results  Component Value Date   WBC 6.9 11/16/2020   HGB 15.1 11/16/2020   HCT 45.5 11/16/2020   MCV 84 11/16/2020   PLT 345 11/16/2020   Lab Results  Component Value Date   IRON 83 06/29/2020   TIBC 293 06/29/2020   FERRITIN 132 06/29/2020   Attestation Statements:   Reviewed by clinician on day of visit: allergies, medications, problem list,  medical history, surgical history, family history, social history, and previous encounter notes.  Time spent on visit including pre-visit chart review and post-visit care and charting was 30 minutes.    Trude Mcburney, am acting as transcriptionist for [employer], PA-C.  I have reviewed the above documentation for accuracy and completeness, and I agree with the above. Alois Cliche, PA-C

## 2021-02-07 ENCOUNTER — Encounter (INDEPENDENT_AMBULATORY_CARE_PROVIDER_SITE_OTHER): Payer: Self-pay | Admitting: Physician Assistant

## 2021-02-21 ENCOUNTER — Other Ambulatory Visit (INDEPENDENT_AMBULATORY_CARE_PROVIDER_SITE_OTHER): Payer: Self-pay | Admitting: Physician Assistant

## 2021-02-21 DIAGNOSIS — R7303 Prediabetes: Secondary | ICD-10-CM

## 2021-02-21 MED FILL — Escitalopram Oxalate Tab 20 MG (Base Equiv): ORAL | 30 days supply | Qty: 30 | Fill #1 | Status: AC

## 2021-02-22 ENCOUNTER — Other Ambulatory Visit (HOSPITAL_COMMUNITY): Payer: Self-pay

## 2021-02-22 ENCOUNTER — Encounter (INDEPENDENT_AMBULATORY_CARE_PROVIDER_SITE_OTHER): Payer: Self-pay

## 2021-02-22 MED ORDER — OZEMPIC (0.25 OR 0.5 MG/DOSE) 2 MG/1.5ML ~~LOC~~ SOPN
0.5000 mg | PEN_INJECTOR | SUBCUTANEOUS | 0 refills | Status: DC
  Filled 2021-02-22: qty 1.5, 28d supply, fill #0

## 2021-02-22 NOTE — Telephone Encounter (Signed)
Would you like to refill? 

## 2021-02-22 NOTE — Telephone Encounter (Signed)
Ok to refill   thanks

## 2021-02-22 NOTE — Telephone Encounter (Signed)
Pt last seen by Tracey Aguilar, PA-C.  

## 2021-02-23 ENCOUNTER — Encounter (INDEPENDENT_AMBULATORY_CARE_PROVIDER_SITE_OTHER): Payer: Self-pay | Admitting: Physician Assistant

## 2021-02-28 ENCOUNTER — Encounter (INDEPENDENT_AMBULATORY_CARE_PROVIDER_SITE_OTHER): Payer: Self-pay | Admitting: Physician Assistant

## 2021-02-28 NOTE — Telephone Encounter (Signed)
Please review and advise.

## 2021-03-01 ENCOUNTER — Telehealth (INDEPENDENT_AMBULATORY_CARE_PROVIDER_SITE_OTHER): Payer: Self-pay | Admitting: Physician Assistant

## 2021-03-01 NOTE — Telephone Encounter (Signed)
Patient called to check status of Mychart messages.

## 2021-03-01 NOTE — Telephone Encounter (Signed)
Can you inform this patient the amount of medical records per page?

## 2021-03-02 ENCOUNTER — Ambulatory Visit (INDEPENDENT_AMBULATORY_CARE_PROVIDER_SITE_OTHER): Payer: Commercial Managed Care - PPO | Admitting: Physician Assistant

## 2021-03-02 NOTE — Telephone Encounter (Signed)
I gave pt an estimate based on the price sheet I have.  Can you please get these records ready?  Thank you

## 2021-03-15 NOTE — Telephone Encounter (Signed)
No, has not picked up records yet.  Left him a vm on 6/13 that they were ready.

## 2021-03-15 NOTE — Telephone Encounter (Signed)
Did the patient pick these up?

## 2021-03-15 NOTE — Telephone Encounter (Signed)
See my chart message

## 2021-03-22 ENCOUNTER — Encounter (INDEPENDENT_AMBULATORY_CARE_PROVIDER_SITE_OTHER): Payer: Self-pay | Admitting: Physician Assistant

## 2021-03-22 ENCOUNTER — Other Ambulatory Visit (HOSPITAL_COMMUNITY): Payer: Self-pay

## 2021-03-22 ENCOUNTER — Other Ambulatory Visit (INDEPENDENT_AMBULATORY_CARE_PROVIDER_SITE_OTHER): Payer: Self-pay | Admitting: Physician Assistant

## 2021-03-22 DIAGNOSIS — R7303 Prediabetes: Secondary | ICD-10-CM

## 2021-03-22 MED FILL — Escitalopram Oxalate Tab 20 MG (Base Equiv): ORAL | 30 days supply | Qty: 30 | Fill #2 | Status: AC

## 2021-03-23 ENCOUNTER — Encounter: Payer: Self-pay | Admitting: Internal Medicine

## 2021-03-23 ENCOUNTER — Other Ambulatory Visit (HOSPITAL_COMMUNITY): Payer: Self-pay

## 2021-03-23 DIAGNOSIS — R7303 Prediabetes: Secondary | ICD-10-CM

## 2021-03-23 MED ORDER — OZEMPIC (0.25 OR 0.5 MG/DOSE) 2 MG/1.5ML ~~LOC~~ SOPN
0.5000 mg | PEN_INJECTOR | SUBCUTANEOUS | 3 refills | Status: DC
Start: 2021-03-23 — End: 2021-07-14
  Filled 2021-03-23: qty 1.5, 28d supply, fill #0
  Filled 2021-06-20: qty 1.5, 28d supply, fill #1

## 2021-03-25 ENCOUNTER — Other Ambulatory Visit (HOSPITAL_COMMUNITY): Payer: Self-pay

## 2021-04-20 ENCOUNTER — Other Ambulatory Visit (HOSPITAL_COMMUNITY): Payer: Self-pay

## 2021-04-20 MED FILL — Escitalopram Oxalate Tab 20 MG (Base Equiv): ORAL | 30 days supply | Qty: 30 | Fill #3 | Status: AC

## 2021-05-25 ENCOUNTER — Other Ambulatory Visit (HOSPITAL_BASED_OUTPATIENT_CLINIC_OR_DEPARTMENT_OTHER): Payer: Self-pay

## 2021-05-25 ENCOUNTER — Other Ambulatory Visit (HOSPITAL_COMMUNITY): Payer: Self-pay

## 2021-05-25 MED ORDER — ESCITALOPRAM OXALATE 20 MG PO TABS
ORAL_TABLET | ORAL | 4 refills | Status: DC
  Filled 2021-05-25: qty 30, 30d supply, fill #0
  Filled 2021-06-20: qty 30, 30d supply, fill #1

## 2021-05-25 MED FILL — Escitalopram Oxalate Tab 20 MG (Base Equiv): ORAL | 30 days supply | Qty: 30 | Fill #0 | Status: CN

## 2021-05-25 MED FILL — Escitalopram Oxalate Tab 20 MG (Base Equiv): ORAL | 30 days supply | Qty: 30 | Fill #4 | Status: CN

## 2021-05-26 ENCOUNTER — Other Ambulatory Visit (HOSPITAL_BASED_OUTPATIENT_CLINIC_OR_DEPARTMENT_OTHER): Payer: Self-pay

## 2021-06-20 ENCOUNTER — Other Ambulatory Visit (HOSPITAL_BASED_OUTPATIENT_CLINIC_OR_DEPARTMENT_OTHER): Payer: Self-pay

## 2021-06-28 ENCOUNTER — Other Ambulatory Visit (HOSPITAL_BASED_OUTPATIENT_CLINIC_OR_DEPARTMENT_OTHER): Payer: Self-pay

## 2021-07-13 ENCOUNTER — Other Ambulatory Visit: Payer: Self-pay

## 2021-07-13 ENCOUNTER — Other Ambulatory Visit (INDEPENDENT_AMBULATORY_CARE_PROVIDER_SITE_OTHER): Payer: BC Managed Care – PPO

## 2021-07-13 DIAGNOSIS — Z125 Encounter for screening for malignant neoplasm of prostate: Secondary | ICD-10-CM

## 2021-07-13 DIAGNOSIS — E782 Mixed hyperlipidemia: Secondary | ICD-10-CM

## 2021-07-13 DIAGNOSIS — E559 Vitamin D deficiency, unspecified: Secondary | ICD-10-CM | POA: Diagnosis not present

## 2021-07-13 DIAGNOSIS — R7303 Prediabetes: Secondary | ICD-10-CM | POA: Diagnosis not present

## 2021-07-13 DIAGNOSIS — Z0000 Encounter for general adult medical examination without abnormal findings: Secondary | ICD-10-CM

## 2021-07-13 LAB — URINALYSIS, ROUTINE W REFLEX MICROSCOPIC
Bilirubin Urine: NEGATIVE
Hgb urine dipstick: NEGATIVE
Ketones, ur: NEGATIVE
Leukocytes,Ua: NEGATIVE
Nitrite: NEGATIVE
RBC / HPF: NONE SEEN (ref 0–?)
Specific Gravity, Urine: 1.02 (ref 1.000–1.030)
Total Protein, Urine: NEGATIVE
Urine Glucose: NEGATIVE
Urobilinogen, UA: 0.2 (ref 0.0–1.0)
WBC, UA: NONE SEEN (ref 0–?)
pH: 6 (ref 5.0–8.0)

## 2021-07-13 LAB — HEPATIC FUNCTION PANEL
ALT: 27 U/L (ref 0–53)
AST: 22 U/L (ref 0–37)
Albumin: 4.7 g/dL (ref 3.5–5.2)
Alkaline Phosphatase: 91 U/L (ref 39–117)
Bilirubin, Direct: 0.1 mg/dL (ref 0.0–0.3)
Total Bilirubin: 0.8 mg/dL (ref 0.2–1.2)
Total Protein: 7.7 g/dL (ref 6.0–8.3)

## 2021-07-13 LAB — CBC WITH DIFFERENTIAL/PLATELET
Basophils Absolute: 0 10*3/uL (ref 0.0–0.1)
Basophils Relative: 0.6 % (ref 0.0–3.0)
Eosinophils Absolute: 0.2 10*3/uL (ref 0.0–0.7)
Eosinophils Relative: 3.5 % (ref 0.0–5.0)
HCT: 43.6 % (ref 39.0–52.0)
Hemoglobin: 14.7 g/dL (ref 13.0–17.0)
Lymphocytes Relative: 27.6 % (ref 12.0–46.0)
Lymphs Abs: 1.8 10*3/uL (ref 0.7–4.0)
MCHC: 33.6 g/dL (ref 30.0–36.0)
MCV: 84 fl (ref 78.0–100.0)
Monocytes Absolute: 0.4 10*3/uL (ref 0.1–1.0)
Monocytes Relative: 6.1 % (ref 3.0–12.0)
Neutro Abs: 4.1 10*3/uL (ref 1.4–7.7)
Neutrophils Relative %: 62.2 % (ref 43.0–77.0)
Platelets: 297 10*3/uL (ref 150.0–400.0)
RBC: 5.19 Mil/uL (ref 4.22–5.81)
RDW: 13.9 % (ref 11.5–15.5)
WBC: 6.6 10*3/uL (ref 4.0–10.5)

## 2021-07-13 LAB — BASIC METABOLIC PANEL
BUN: 15 mg/dL (ref 6–23)
CO2: 29 mEq/L (ref 19–32)
Calcium: 9.2 mg/dL (ref 8.4–10.5)
Chloride: 100 mEq/L (ref 96–112)
Creatinine, Ser: 0.91 mg/dL (ref 0.40–1.50)
GFR: 102.8 mL/min (ref >60.00)
Glucose, Bld: 88 mg/dL (ref 70–99)
Potassium: 3.9 mEq/L (ref 3.5–5.1)
Sodium: 137 mEq/L (ref 135–145)

## 2021-07-13 LAB — TSH: TSH: 2.2 u[IU]/mL (ref 0.35–5.50)

## 2021-07-13 LAB — LIPID PANEL
Cholesterol: 146 mg/dL (ref 0–200)
HDL: 32.2 mg/dL — ABNORMAL LOW (ref >39.00)
LDL Cholesterol: 83 mg/dL (ref 0–99)
NonHDL: 114
Total CHOL/HDL Ratio: 5
Triglycerides: 154 mg/dL — ABNORMAL HIGH (ref 0.0–149.0)
VLDL: 30.8 mg/dL (ref 0.0–40.0)

## 2021-07-13 LAB — HEMOGLOBIN A1C: Hgb A1c MFr Bld: 5.8 % (ref 4.6–6.5)

## 2021-07-13 LAB — PSA: PSA: 0.5 ng/mL (ref 0.10–4.00)

## 2021-07-13 LAB — VITAMIN D 25 HYDROXY (VIT D DEFICIENCY, FRACTURES): VITD: 47.45 ng/mL (ref 30.00–100.00)

## 2021-07-14 ENCOUNTER — Other Ambulatory Visit (HOSPITAL_BASED_OUTPATIENT_CLINIC_OR_DEPARTMENT_OTHER): Payer: Self-pay

## 2021-07-14 ENCOUNTER — Ambulatory Visit (INDEPENDENT_AMBULATORY_CARE_PROVIDER_SITE_OTHER): Payer: BC Managed Care – PPO | Admitting: Internal Medicine

## 2021-07-14 ENCOUNTER — Encounter: Payer: Self-pay | Admitting: Internal Medicine

## 2021-07-14 VITALS — BP 138/94 | HR 78 | Ht 71.0 in | Wt 388.0 lb

## 2021-07-14 DIAGNOSIS — I10 Essential (primary) hypertension: Secondary | ICD-10-CM | POA: Diagnosis not present

## 2021-07-14 DIAGNOSIS — Z0001 Encounter for general adult medical examination with abnormal findings: Secondary | ICD-10-CM

## 2021-07-14 DIAGNOSIS — Z6841 Body Mass Index (BMI) 40.0 and over, adult: Secondary | ICD-10-CM

## 2021-07-14 DIAGNOSIS — R7303 Prediabetes: Secondary | ICD-10-CM

## 2021-07-14 DIAGNOSIS — E782 Mixed hyperlipidemia: Secondary | ICD-10-CM | POA: Diagnosis not present

## 2021-07-14 DIAGNOSIS — E559 Vitamin D deficiency, unspecified: Secondary | ICD-10-CM

## 2021-07-14 DIAGNOSIS — Z1159 Encounter for screening for other viral diseases: Secondary | ICD-10-CM

## 2021-07-14 DIAGNOSIS — Z23 Encounter for immunization: Secondary | ICD-10-CM

## 2021-07-14 MED ORDER — ESCITALOPRAM OXALATE 20 MG PO TABS
20.0000 mg | ORAL_TABLET | Freq: Every day | ORAL | 3 refills | Status: AC
  Filled 2021-07-14: qty 90, 90d supply, fill #0

## 2021-07-14 MED ORDER — LOSARTAN POTASSIUM 100 MG PO TABS
100.0000 mg | ORAL_TABLET | Freq: Every day | ORAL | 3 refills | Status: DC
  Filled 2021-07-14 – 2022-06-11 (×2): qty 90, 90d supply, fill #0

## 2021-07-14 MED ORDER — SEMAGLUTIDE (1 MG/DOSE) 4 MG/3ML ~~LOC~~ SOPN
1.0000 mg | PEN_INJECTOR | SUBCUTANEOUS | 3 refills | Status: DC
  Filled 2021-07-14: qty 9, 84d supply, fill #0

## 2021-07-14 MED ORDER — BUSPIRONE HCL 10 MG PO TABS
10.0000 mg | ORAL_TABLET | Freq: Three times a day (TID) | ORAL | 3 refills | Status: DC | PRN
  Filled 2021-07-14: qty 270, 90d supply, fill #0

## 2021-07-14 MED ORDER — AMLODIPINE BESYLATE 10 MG PO TABS
10.0000 mg | ORAL_TABLET | Freq: Every day | ORAL | 3 refills | Status: DC
  Filled 2021-07-14 – 2022-06-11 (×4): qty 90, 90d supply, fill #0

## 2021-07-14 MED ORDER — ATORVASTATIN CALCIUM 20 MG PO TABS
20.0000 mg | ORAL_TABLET | Freq: Every day | ORAL | 3 refills | Status: DC
  Filled 2021-07-14 – 2022-06-11 (×2): qty 90, 90d supply, fill #0

## 2021-07-14 NOTE — Patient Instructions (Signed)
You had the flu shot today  Ok to increase the ozempic to the 1 mg dosing  Please continue all other medications as before, and refills have been done if requested.  Please have the pharmacy call with any other refills you may need.  Please continue your efforts at being more active, low cholesterol diet, and weight control.  You are otherwise up to date with prevention measures today.  Please keep your appointments with your specialists as you may have planned  Please make an Appointment to return in 6 months, or sooner if needed

## 2021-07-14 NOTE — Progress Notes (Signed)
Patient ID: Kyle Levy, male   DOB: Apr 29, 1976, 45 y.o.   MRN: [id number]         Chief Complaint:: wellness exam and elevated BP, obesity, hld, preDM       HPI:  Kyle Levy is a 45 y.o. male here for wellness exam; declines hep c screen for now, declines covid booster, o/w up to date.                          Also Pt denies chest pain, increased sob or doe, wheezing, orthopnea, PND, increased LE swelling, palpitations, dizziness or syncope.   Pt denies polydipsia, polyuria, or new focal neuro s/s.   Pt denies fever, wt loss, night sweats, loss of appetite, or other constitutional symptoms  Hard to lose wt, ok with trying ozempic.  Trying to follow lower chol diet.  BP ok at home.  No other new complaints   Wt Readings from Last 3 Encounters:  07/14/21 (!) 388 lb (176 kg)  02/02/21 (!) 379 lb (171.9 kg)  01/12/21 (!) 384 lb (174.2 kg)   BP Readings from Last 3 Encounters:  07/14/21 (!) 138/94  02/02/21 (!) 148/88  01/12/21 (!) 138/91   Immunization History  Administered Date(s) Administered   Influenza,inj,Quad PF,6+ Mos 05/28/2018, 06/04/2019, 07/12/2020, 07/14/2021   PFIZER(Purple Top)SARS-COV-2 Vaccination 10/11/2019, 10/31/2019, 08/02/2020   Tdap 10/06/2013, 10/30/2018   Health Maintenance Due  Topic Date Due   Hepatitis C Screening  Never done      Past Medical History:  Diagnosis Date   Anxiety 08/27/2011   Back pain    HTN (hypertension) 08/27/2011   Hyperlipidemia 08/27/2011   Hypertension    Morbid obesity (HCC) 09/14/2015   OSA on CPAP    Psoriasis 09/14/2015   Past Surgical History:  Procedure Laterality Date   KNEE ARTHROSCOPY     bilateral knee    reports that he has never smoked. He has never used smokeless tobacco. He reports current alcohol use. He reports that he does not use drugs. family history includes Alcoholism in his father; Diabetes in his father; Eating disorder in his father; Heart disease in his father; Hyperlipidemia in his father;  Hypertension in his father; Obesity in his father; Sleep apnea in his father and mother; Stroke in his father. No Known Allergies Current Outpatient Medications on File Prior to Visit  Medication Sig Dispense Refill   Diclofenac Sodium (PENNSAID) 2 % SOLN Place 2 g onto the skin 2 (two) times daily. 112 g 3   Ergocalciferol (VITAMIN D2 PO) Take 5,000 Units by mouth.      gabapentin (NEURONTIN) 300 MG capsule Take 1-3 capsules (300-900 mg total) by mouth 3 (three) times daily as needed. 90 capsule 3   levocetirizine (XYZAL) 5 MG tablet Take 1 tablet (5 mg total) by mouth every evening. 30 tablet 3   Multiple Vitamin (MULTIVITAMIN) tablet Take 1 tablet by mouth daily.     Omega-3 Fatty Acids (FISH OIL PO) Take by mouth daily.     No current facility-administered medications on file prior to visit.        ROS:  All others reviewed and negative.  Objective        PE:  BP (!) 138/94 (BP Location: Right Arm, Patient Position: Sitting, Cuff Size: Large)   Pulse 78   Ht 5\' 11"  (1.803 m)   Wt (!) 388 lb (176 kg)   SpO2 95%   BMI 54.12  kg/m                 Constitutional: Pt appears in NAD               HENT: Head: NCAT.                Right Ear: External ear normal.                 Left Ear: External ear normal.                Eyes: . Pupils are equal, round, and reactive to light. Conjunctivae and EOM are normal               Nose: without d/c or deformity               Neck: Neck supple. Gross normal ROM               Cardiovascular: Normal rate and regular rhythm.                 Pulmonary/Chest: Effort normal and breath sounds without rales or wheezing.                Abd:  Soft, NT, ND, + BS, no organomegaly               Neurological: Pt is alert. At baseline orientation, motor grossly intact               Skin: Skin is warm. No rashes, no other new lesions, LE edema - none               Psychiatric: Pt behavior is normal without agitation   Micro: none  Cardiac tracings I have  personally interpreted today:  none  Pertinent Radiological findings (summarize): none   Lab Results  Component Value Date   WBC 6.6 07/13/2021   HGB 14.7 07/13/2021   HCT 43.6 07/13/2021   PLT 297.0 07/13/2021   GLUCOSE 88 07/13/2021   CHOL 146 07/13/2021   TRIG 154.0 (H) 07/13/2021   HDL 32.20 (L) 07/13/2021   LDLDIRECT 85.0 04/10/2016   LDLCALC 83 07/13/2021   ALT 27 07/13/2021   AST 22 07/13/2021   NA 137 07/13/2021   K 3.9 07/13/2021   CL 100 07/13/2021   CREATININE 0.91 07/13/2021   BUN 15 07/13/2021   CO2 29 07/13/2021   TSH 2.20 07/13/2021   PSA 0.50 07/13/2021   HGBA1C 5.8 07/13/2021   Assessment/Plan:  Kyle Levy is a 45 y.o. White or Caucasian [1] male with  has a past medical history of Anxiety (08/27/2011), Back pain, HTN (hypertension) (08/27/2011), Hyperlipidemia (08/27/2011), Hypertension, Morbid obesity (HCC) (09/14/2015), OSA on CPAP, and Psoriasis (09/14/2015).  Encounter for well adult exam with abnormal findings Age and sex appropriate education and counseling updated with regular exercise and diet Referrals for preventative services - decliens hep c screeen Immunizations addressed - declines covid booster Smoking counseling  - none needed Evidence for depression or other mood disorder - stable anxiety Most recent labs reviewed. I have personally reviewed and have noted: 1) the patient's medical and social history 2) The patient's current medications and supplements 3) The patient's height, weight, and BMI have been recorded in the chart   Essential hypertension BP Readings from Last 3 Encounters:  07/14/21 (!) 138/94  02/02/21 (!) 148/88  01/12/21 (!) 138/91   Uncontrolled, pt to continue medical treatment norvasc losartan as declines change, f/u BP  athome  Mixed hyperlipidemia Lab Results  Component Value Date   LDLCALC 83 07/13/2021   Stable, pt to continue current statin lipitor 20   Class 3 severe obesity with serious  comorbidity and body mass index (BMI) of 50.0 to 59.9 in adult Kyle Levy [facility] Uncontrolled, worsening recently, unable to lose, for ozempic trial,  to f/u any worsening symptoms or concerns  Prediabetes Lab Results  Component Value Date   HGBA1C 5.8 07/13/2021   Stable, pt to continue current medical treatment  - diet and ozempic   Vitamin D deficiency Last vitamin D Lab Results  Component Value Date   VD25OH 47.45 07/13/2021   Stable, cont oral replacement  Followup: Return in about 6 months (around 01/12/2022).  Oliver Barre, MD 07/15/2021 8:34 PM [facility]  Primary Care - [facility] Internal Medicine

## 2021-07-15 ENCOUNTER — Encounter: Payer: Self-pay | Admitting: Internal Medicine

## 2021-07-15 DIAGNOSIS — Z0001 Encounter for general adult medical examination with abnormal findings: Secondary | ICD-10-CM | POA: Insufficient documentation

## 2021-07-15 NOTE — Assessment & Plan Note (Signed)
Age and sex appropriate education and counseling updated with regular exercise and diet Referrals for preventative services - decliens hep c screeen Immunizations addressed - declines covid booster Smoking counseling  - none needed Evidence for depression or other mood disorder - stable anxiety Most recent labs reviewed. I have personally reviewed and have noted: 1) the patient's medical and social history 2) The patient's current medications and supplements 3) The patient's height, weight, and BMI have been recorded in the chart

## 2021-07-15 NOTE — Assessment & Plan Note (Signed)
Lab Results  Component Value Date   HGBA1C 5.8 07/13/2021   Stable, pt to continue current medical treatment  - diet and ozempic

## 2021-07-15 NOTE — Assessment & Plan Note (Signed)
Lab Results  ?Component Value Date  ? LDLCALC 83 07/13/2021  ? ?Stable, pt to continue current statin lipitor 20 ? ?

## 2021-07-15 NOTE — Assessment & Plan Note (Signed)
Last vitamin D Lab Results  Component Value Date   VD25OH 47.45 07/13/2021   Stable, cont oral replacement

## 2021-07-15 NOTE — Assessment & Plan Note (Signed)
Uncontrolled, worsening recently, unable to lose, for ozempic trial,  to f/u any worsening symptoms or concerns

## 2021-07-15 NOTE — Assessment & Plan Note (Signed)
BP Readings from Last 3 Encounters:  07/14/21 (!) 138/94  02/02/21 (!) 148/88  01/12/21 (!) 138/91   Uncontrolled, pt to continue medical treatment norvasc losartan as declines change, f/u BP athome

## 2021-07-21 ENCOUNTER — Other Ambulatory Visit (HOSPITAL_BASED_OUTPATIENT_CLINIC_OR_DEPARTMENT_OTHER): Payer: Self-pay

## 2021-08-15 ENCOUNTER — Encounter: Payer: Self-pay | Admitting: Internal Medicine

## 2021-08-15 ENCOUNTER — Other Ambulatory Visit: Payer: Self-pay | Admitting: Internal Medicine

## 2021-08-15 MED ORDER — LEVOCETIRIZINE DIHYDROCHLORIDE 5 MG PO TABS: 5.0000 mg | ORAL_TABLET | Freq: Every evening | ORAL | 3 refills | Status: DC

## 2021-08-15 NOTE — Telephone Encounter (Signed)
Ok this is done 

## 2021-10-11 DIAGNOSIS — G4733 Obstructive sleep apnea (adult) (pediatric): Secondary | ICD-10-CM | POA: Diagnosis not present

## 2021-11-01 ENCOUNTER — Telehealth: Payer: BC Managed Care – PPO | Admitting: Nurse Practitioner

## 2021-11-01 DIAGNOSIS — J069 Acute upper respiratory infection, unspecified: Secondary | ICD-10-CM | POA: Diagnosis not present

## 2021-11-01 MED ORDER — BENZONATATE 100 MG PO CAPS: 100.0000 mg | ORAL_CAPSULE | Freq: Two times a day (BID) | ORAL | 0 refills | Status: DC | PRN

## 2021-11-01 NOTE — Progress Notes (Signed)
We are sorry that you are not feeling well.  Here is how we plan to help!  Based on your presentation I believe you most likely have A cough due to a virus.  This is called viral bronchitis and is best treated by rest, plenty of fluids and control of the cough.  You may use Ibuprofen or Tylenol as directed to help your symptoms.     In addition you may use A prescription cough medication called Tessalon Perles 100mg. You may take 1-2 capsules every 8 hours as needed for your cough.   From your responses in the eVisit questionnaire you describe inflammation in the upper respiratory tract which is causing a significant cough.  This is commonly called Bronchitis and has four common causes:   Allergies Viral Infections Acid Reflux Bacterial Infection Allergies, viruses and acid reflux are treated by controlling symptoms or eliminating the cause. An example might be a cough caused by taking certain blood pressure medications. You stop the cough by changing the medication. Another example might be a cough caused by acid reflux. Controlling the reflux helps control the cough.  USE OF BRONCHODILATOR ("RESCUE") INHALERS: There is a risk from using your bronchodilator too frequently.  The risk is that over-reliance on a medication which only relaxes the muscles surrounding the breathing tubes can reduce the effectiveness of medications prescribed to reduce swelling and congestion of the tubes themselves.  Although you feel brief relief from the bronchodilator inhaler, your asthma may actually be worsening with the tubes becoming more swollen and filled with mucus.  This can delay other crucial treatments, such as oral steroid medications. If you need to use a bronchodilator inhaler daily, several times per day, you should discuss this with your provider.  There are probably better treatments that could be used to keep your asthma under control.     HOME CARE Only take medications as instructed by your  medical team. Complete the entire course of an antibiotic. Drink plenty of fluids and get plenty of rest. Avoid close contacts especially the very young and the elderly Cover your mouth if you cough or cough into your sleeve. Always remember to wash your hands A steam or ultrasonic humidifier can help congestion.   GET HELP RIGHT AWAY IF: You develop worsening fever. You become short of breath You cough up blood. Your symptoms persist after you have completed your treatment plan MAKE SURE YOU  Understand these instructions. Will watch your condition. Will get help right away if you are not doing well or get worse.    Thank you for choosing an e-visit.  Your e-visit answers were reviewed by a board certified advanced clinical practitioner to complete your personal care plan. Depending upon the condition, your plan could have included both over the counter or prescription medications.  Please review your pharmacy choice. Make sure the pharmacy is open so you can pick up prescription now. If there is a problem, you may contact your provider through MyChart messaging and have the prescription routed to another pharmacy.  Your safety is important to us. If you have drug allergies check your prescription carefully.   For the next 24 hours you can use MyChart to ask questions about today's visit, request a non-urgent call back, or ask for a work or school excuse. You will get an email in the next two days asking about your experience. I hope that your e-visit has been valuable and will speed your recovery.  5-10 minutes spent reviewing   and documenting in chart.  

## 2021-11-11 DIAGNOSIS — G4733 Obstructive sleep apnea (adult) (pediatric): Secondary | ICD-10-CM | POA: Diagnosis not present

## 2021-12-09 DIAGNOSIS — G4733 Obstructive sleep apnea (adult) (pediatric): Secondary | ICD-10-CM | POA: Diagnosis not present

## 2022-01-16 ENCOUNTER — Encounter: Payer: Self-pay | Admitting: Internal Medicine

## 2022-01-16 ENCOUNTER — Ambulatory Visit (INDEPENDENT_AMBULATORY_CARE_PROVIDER_SITE_OTHER): Payer: BC Managed Care – PPO | Admitting: Internal Medicine

## 2022-01-16 VITALS — BP 150/80 | HR 74 | Temp 97.8°F | Ht 71.0 in | Wt 394.0 lb

## 2022-01-16 DIAGNOSIS — J3089 Other allergic rhinitis: Secondary | ICD-10-CM

## 2022-01-16 DIAGNOSIS — Z0001 Encounter for general adult medical examination with abnormal findings: Secondary | ICD-10-CM | POA: Diagnosis not present

## 2022-01-16 DIAGNOSIS — I10 Essential (primary) hypertension: Secondary | ICD-10-CM | POA: Diagnosis not present

## 2022-01-16 DIAGNOSIS — E538 Deficiency of other specified B group vitamins: Secondary | ICD-10-CM

## 2022-01-16 DIAGNOSIS — J302 Other seasonal allergic rhinitis: Secondary | ICD-10-CM

## 2022-01-16 DIAGNOSIS — E559 Vitamin D deficiency, unspecified: Secondary | ICD-10-CM | POA: Diagnosis not present

## 2022-01-16 DIAGNOSIS — Z1159 Encounter for screening for other viral diseases: Secondary | ICD-10-CM | POA: Diagnosis not present

## 2022-01-16 DIAGNOSIS — E782 Mixed hyperlipidemia: Secondary | ICD-10-CM

## 2022-01-16 DIAGNOSIS — R7303 Prediabetes: Secondary | ICD-10-CM | POA: Diagnosis not present

## 2022-01-16 DIAGNOSIS — Z6841 Body Mass Index (BMI) 40.0 and over, adult: Secondary | ICD-10-CM

## 2022-01-16 LAB — HEPATIC FUNCTION PANEL
ALT: 29 U/L (ref 0–53)
AST: 24 U/L (ref 0–37)
Albumin: 4.5 g/dL (ref 3.5–5.2)
Alkaline Phosphatase: 87 U/L (ref 39–117)
Bilirubin, Direct: 0.1 mg/dL (ref 0.0–0.3)
Total Bilirubin: 0.6 mg/dL (ref 0.2–1.2)
Total Protein: 7.6 g/dL (ref 6.0–8.3)

## 2022-01-16 LAB — BASIC METABOLIC PANEL
BUN: 14 mg/dL (ref 6–23)
CO2: 26 mEq/L (ref 19–32)
Calcium: 9 mg/dL (ref 8.4–10.5)
Chloride: 102 mEq/L (ref 96–112)
Creatinine, Ser: 0.81 mg/dL (ref 0.40–1.50)
GFR: 107.15 mL/min (ref >60.00)
Glucose, Bld: 93 mg/dL (ref 70–99)
Potassium: 3.6 mEq/L (ref 3.5–5.1)
Sodium: 137 mEq/L (ref 135–145)

## 2022-01-16 LAB — CBC WITH DIFFERENTIAL/PLATELET
Basophils Absolute: 0 10*3/uL (ref 0.0–0.1)
Basophils Relative: 0.5 % (ref 0.0–3.0)
Eosinophils Absolute: 0.3 10*3/uL (ref 0.0–0.7)
Eosinophils Relative: 3.3 % (ref 0.0–5.0)
HCT: 44 % (ref 39.0–52.0)
Hemoglobin: 14.9 g/dL (ref 13.0–17.0)
Lymphocytes Relative: 19.2 % (ref 12.0–46.0)
Lymphs Abs: 1.8 10*3/uL (ref 0.7–4.0)
MCHC: 33.9 g/dL (ref 30.0–36.0)
MCV: 84.1 fl (ref 78.0–100.0)
Monocytes Absolute: 0.6 10*3/uL (ref 0.1–1.0)
Monocytes Relative: 6.3 % (ref 3.0–12.0)
Neutro Abs: 6.8 10*3/uL (ref 1.4–7.7)
Neutrophils Relative %: 70.7 % (ref 43.0–77.0)
Platelets: 329 10*3/uL (ref 150.0–400.0)
RBC: 5.23 Mil/uL (ref 4.22–5.81)
RDW: 13.7 % (ref 11.5–15.5)
WBC: 9.6 10*3/uL (ref 4.0–10.5)

## 2022-01-16 LAB — URINALYSIS, ROUTINE W REFLEX MICROSCOPIC
Bilirubin Urine: NEGATIVE
Hgb urine dipstick: NEGATIVE
Ketones, ur: NEGATIVE
Leukocytes,Ua: NEGATIVE
Nitrite: NEGATIVE
RBC / HPF: NONE SEEN (ref 0–?)
Specific Gravity, Urine: 1.015 (ref 1.000–1.030)
Total Protein, Urine: NEGATIVE
Urine Glucose: NEGATIVE
Urobilinogen, UA: 0.2 (ref 0.0–1.0)
pH: 7 (ref 5.0–8.0)

## 2022-01-16 LAB — LDL CHOLESTEROL, DIRECT: Direct LDL: 93 mg/dL

## 2022-01-16 LAB — LIPID PANEL
Cholesterol: 156 mg/dL (ref 0–200)
HDL: 30.1 mg/dL — ABNORMAL LOW (ref >39.00)
NonHDL: 126.34
Total CHOL/HDL Ratio: 5
Triglycerides: 336 mg/dL — ABNORMAL HIGH (ref 0.0–149.0)
VLDL: 67.2 mg/dL — ABNORMAL HIGH (ref 0.0–40.0)

## 2022-01-16 LAB — VITAMIN D 25 HYDROXY (VIT D DEFICIENCY, FRACTURES): VITD: 36.03 ng/mL (ref 30.00–100.00)

## 2022-01-16 LAB — PSA: PSA: 0.47 ng/mL (ref 0.10–4.00)

## 2022-01-16 LAB — VITAMIN B12: Vitamin B-12: 380 pg/mL (ref 211–911)

## 2022-01-16 LAB — HEMOGLOBIN A1C: Hgb A1c MFr Bld: 6.1 % (ref 4.6–6.5)

## 2022-01-16 LAB — TSH: TSH: 1.71 u[IU]/mL (ref 0.35–5.50)

## 2022-01-16 MED ORDER — METOPROLOL SUCCINATE ER 50 MG PO TB24: 50.0000 mg | ORAL_TABLET | Freq: Every day | ORAL | 3 refills | Status: DC

## 2022-01-16 MED ORDER — OZEMPIC (0.25 OR 0.5 MG/DOSE) 2 MG/1.5ML ~~LOC~~ SOPN
0.5000 mg | PEN_INJECTOR | SUBCUTANEOUS | 3 refills | Status: DC
Start: 2022-01-16 — End: 2023-01-21

## 2022-01-16 NOTE — Patient Instructions (Addendum)
Please take all new medication as prescribed - the ozempic, but also consider wegovy, mounjaro or rybelsus if not covered  ? ?Please take all new medication as prescribed - the toprol xl 50 mg per day for blood pressure ? ?Please take all new medication as prescribed - the nasacort for allergies ? ?Please continue all other medications as before, and refills have been done if requested. ? ?Please have the pharmacy call with any other refills you may need. ? ?Please continue your efforts at being more active, low cholesterol diet, and weight control. ? ?You are otherwise up to date with prevention measures today. ? ?Please keep your appointments with your specialists as you may have planned ? ?Please go to the LAB at the blood drawing area for the tests to be done ? ?You will be contacted by phone if any changes need to be made immediately.  Otherwise, you will receive a letter about your results with an explanation, but please check with MyChart first. ? ?Please remember to sign up for MyChart if you have not done so, as this will be important to you in the future with finding out test results, communicating by private email, and scheduling acute appointments online when needed. ? ?Please make an Appointment to return in 6 months, or sooner if needed ?

## 2022-01-16 NOTE — Assessment & Plan Note (Signed)
Last vitamin D Lab Results  Component Value Date   VD25OH 47.45 07/13/2021   Stable, cont oral replacement  

## 2022-01-16 NOTE — Progress Notes (Signed)
Patient ID: Kyle Levy, male   DOB: 01/02/1976, 46 y.o.   MRN: [id number] ? ? ? ?     Chief Complaint:: wellness exam and obesity, allergies, htn, hyperglycemia ? ?     HPI:  Kyle Levy is a 46 y.o. male here for wellness exam; due for hep c screen, decliens covid booster, o/w up to date ?         ?              Also unable to lose wt with diet and exercise, knee pain prevents, now gained 15 lbs in the past year Pt denies chest pain, increased sob or doe, wheezing, orthopnea, PND, increased LE swelling, palpitations, dizziness or syncope.   Pt denies polydipsia, polyuria, or new focal neuro s/s.    Pt denies fever, wt loss, night sweats, loss of appetite, or other constitutional symptoms  Denies worsening depressive symptoms, suicidal ideation, or panic.  Does have several wks ongoing nasal allergy symptoms with clearish congestion, itch and sneezing, without fever, pain, ST, cough, swelling or wheezing.  Going to the Gym 4 days per wk  Does have several wks ongoing nasal allergy symptoms with clearish congestion, itch and sneezing, without fever, pain, ST, cough, swelling or wheezing. ?Wt Readings from Last 3 Encounters:  ?01/16/22 (!) 394 lb (178.7 kg)  ?07/14/21 (!) 388 lb (176 kg)  ?02/02/21 (!) 379 lb (171.9 kg)  ? ?BP Readings from Last 3 Encounters:  ?01/16/22 (!) 150/80  ?07/14/21 (!) 138/94  ?02/02/21 (!) 148/88  ? ?Immunization History  ?Administered Date(s) Administered  ? Influenza,inj,Quad PF,6+ Mos 05/28/2018, 06/04/2019, 07/12/2020, 07/14/2021  ? PFIZER(Purple Top)SARS-COV-2 Vaccination 10/11/2019, 10/31/2019, 08/02/2020  ? Tdap 10/06/2013, 10/30/2018  ? ?Health Maintenance Due  ?Topic Date Due  ? Hepatitis C Screening  Never done  ? ?  ? ?Past Medical History:  ?Diagnosis Date  ? Anxiety 08/27/2011  ? Back pain   ? HTN (hypertension) 08/27/2011  ? Hyperlipidemia 08/27/2011  ? Hypertension   ? Morbid obesity (HCC) 09/14/2015  ? OSA on CPAP   ? Psoriasis 09/14/2015  ? ?Past Surgical History:   ?Procedure Laterality Date  ? KNEE ARTHROSCOPY    ? bilateral knee  ? ? reports that he has never smoked. He has never used smokeless tobacco. He reports current alcohol use. He reports that he does not use drugs. ?family history includes Alcoholism in his father; Diabetes in his father; Eating disorder in his father; Heart disease in his father; Hyperlipidemia in his father; Hypertension in his father; Obesity in his father; Sleep apnea in his father and mother; Stroke in his father. ?No Known Allergies ?Current Outpatient Medications on File Prior to Visit  ?Medication Sig Dispense Refill  ? amLODipine (NORVASC) 10 MG tablet Take 1 tablet (10 mg total) by mouth daily. 90 tablet 3  ? atorvastatin (LIPITOR) 20 MG tablet Take 1 tablet (20 mg total) by mouth daily. 90 tablet 3  ? busPIRone (BUSPAR) 10 MG tablet Take 1 tablet (10 mg total) by mouth 3 (three) times daily as needed. 270 tablet 3  ? Diclofenac Sodium (PENNSAID) 2 % SOLN Place 2 g onto the skin 2 (two) times daily. 112 g 3  ? Ergocalciferol (VITAMIN D2 PO) Take 5,000 Units by mouth.     ? escitalopram (LEXAPRO) 20 MG tablet Take 1 tablet (20 mg total) by mouth daily. 90 tablet 3  ? gabapentin (NEURONTIN) 300 MG capsule Take 1-3 capsules (300-900 mg total) by mouth  3 (three) times daily as needed. 90 capsule 3  ? levocetirizine (XYZAL) 5 MG tablet Take 1 tablet (5 mg total) by mouth every evening. 90 tablet 3  ? losartan (COZAAR) 100 MG tablet Take 1 tablet (100 mg total) by mouth daily. 90 tablet 3  ? Multiple Vitamin (MULTIVITAMIN) tablet Take 1 tablet by mouth daily.    ? Omega-3 Fatty Acids (FISH OIL PO) Take by mouth daily.    ? ?No current facility-administered medications on file prior to visit.  ? ?     ROS:  All others reviewed and negative. ? ?Objective  ? ?     PE:  BP (!) 150/80 (BP Location: Right Arm, Patient Position: Sitting, Cuff Size: Large)   Pulse 74   Temp 97.8 ?F (36.6 ?C) (Oral)   Ht 5\' 11"  (1.803 m)   Wt (!) 394 lb (178.7 kg)    SpO2 94%   BMI 54.95 kg/m?  ? ?              Constitutional: Pt appears in NAD ?              HENT: Head: NCAT.  ?              Right Ear: External ear normal.   ?              Left Ear: External ear normal.  ?              Eyes: . Pupils are equal, round, and reactive to light. Conjunctivae and EOM are normal ?              Nose: without d/c or deformity ?              Neck: Neck supple. Gross normal ROM ?              Cardiovascular: Normal rate and regular rhythm.   ?              Pulmonary/Chest: Effort normal and breath sounds without rales or wheezing.  ?              Abd:  Soft, NT, ND, + BS, no organomegaly ?              Neurological: Pt is alert. At baseline orientation, motor grossly intact ?              Skin: Skin is warm. No rashes, no other new lesions, LE edema - none ?              Psychiatric: Pt behavior is normal without agitation  ? ?Micro: none ? ?Cardiac tracings I have personally interpreted today:  none ? ?Pertinent Radiological findings (summarize): none  ? ?Lab Results  ?Component Value Date  ? WBC 9.6 01/16/2022  ? HGB 14.9 01/16/2022  ? HCT 44.0 01/16/2022  ? PLT 329.0 01/16/2022  ? GLUCOSE 93 01/16/2022  ? CHOL 156 01/16/2022  ? TRIG 336.0 (H) 01/16/2022  ? HDL 30.10 (L) 01/16/2022  ? LDLDIRECT 93.0 01/16/2022  ? LDLCALC 83 07/13/2021  ? ALT 29 01/16/2022  ? AST 24 01/16/2022  ? NA 137 01/16/2022  ? K 3.6 01/16/2022  ? CL 102 01/16/2022  ? CREATININE 0.81 01/16/2022  ? BUN 14 01/16/2022  ? CO2 26 01/16/2022  ? TSH 1.71 01/16/2022  ? PSA 0.47 01/16/2022  ? HGBA1C 6.1 01/16/2022  ? ?Assessment/Plan:  ?Kyle Levy is a 46 y.o. White  or Caucasian [1] male with  has a past medical history of Anxiety (08/27/2011), Back pain, HTN (hypertension) (08/27/2011), Hyperlipidemia (08/27/2011), Hypertension, Morbid obesity (HCC) (09/14/2015), OSA on CPAP, and Psoriasis (09/14/2015). ? ?Vitamin D deficiency ?Last vitamin D ?Lab Results  ?Component Value Date  ? VD25OH 47.45 07/13/2021  ? ?Stable,  cont oral replacement ? ? ?Encounter for well adult exam with abnormal findings ?Age and sex appropriate education and counseling updated with regular exercise and diet ?Referrals for preventative services - for hep c screen ?Immunizations addressed - declines covid booster ?Smoking counseling  - none needed ?Evidence for depression or other mood disorder - none significant ?Most recent labs reviewed. ?I have personally reviewed and have noted: ?1) the patient's medical and social history ?2) The patient's current medications and supplements ?3) The patient's height, weight, and BMI have been recorded in the chart ? ? ?Seasonal and perennial allergic rhinitis ?Mild to mod, for nasacort asd,  to f/u any worsening symptoms or concerns ? ?Essential hypertension ?BP Readings from Last 3 Encounters:  ?01/16/22 (!) 150/80  ?07/14/21 (!) 138/94  ?02/02/21 (!) 148/88  ? ?uncontrolled, pt to continue medical treatment norvasc, and add toprol xl 50 ? ? ?Mixed hyperlipidemia ?Lab Results  ?Component Value Date  ? LDLCALC 83 07/13/2021  ? ?Stable, pt to continue current statin lipitor 20 ? ? ?Prediabetes ?Lab Results  ?Component Value Date  ? HGBA1C 6.1 01/16/2022  ? ?Stable but pt to add ozempic for wt loss ? ?Class 3 severe obesity with serious comorbidity and body mass index (BMI) of 50.0 to 59.9 in adult [facility](HCC) ?Uncontrolled, to add ozempic asd ? ?Followup: No follow-ups on file. ? ?Oliver BarreJames Brittnei Jagiello, MD 01/20/2022 7:34 PM ?[facility] ?[facility] ?Internal Medicine ?

## 2022-01-20 NOTE — Assessment & Plan Note (Signed)
Age and sex appropriate education and counseling updated with regular exercise and diet ?Referrals for preventative services - for hep c screen ?Immunizations addressed - declines covid booster ?Smoking counseling  - none needed ?Evidence for depression or other mood disorder - none significant ?Most recent labs reviewed. ?I have personally reviewed and have noted: ?1) the patient's medical and social history ?2) The patient's current medications and supplements ?3) The patient's height, weight, and BMI have been recorded in the chart ? ?

## 2022-01-20 NOTE — Assessment & Plan Note (Signed)
Mild to mod, for nasacort asd,  to f/u any worsening symptoms or concerns 

## 2022-01-20 NOTE — Assessment & Plan Note (Signed)
Lab Results  ?Component Value Date  ? [place] 83 07/13/2021  ? ?Stable, pt to continue current statin lipitor 20 ? ?

## 2022-01-20 NOTE — Assessment & Plan Note (Signed)
Uncontrolled, to add ozempic asd ?

## 2022-01-20 NOTE — Assessment & Plan Note (Signed)
Lab Results  ?Component Value Date  ? HGBA1C 6.1 01/16/2022  ? ?Stable but pt to add ozempic for wt loss ?

## 2022-01-20 NOTE — Assessment & Plan Note (Signed)
BP Readings from Last 3 Encounters:  ?01/16/22 (!) 150/80  ?07/14/21 (!) 138/94  ?02/02/21 (!) 148/88  ? ?uncontrolled, pt to continue medical treatment norvasc, and add toprol xl 50 ? ?

## 2022-02-11 ENCOUNTER — Encounter: Payer: Self-pay | Admitting: Internal Medicine

## 2022-05-02 ENCOUNTER — Encounter (INDEPENDENT_AMBULATORY_CARE_PROVIDER_SITE_OTHER): Payer: Self-pay

## 2022-06-11 ENCOUNTER — Other Ambulatory Visit (HOSPITAL_COMMUNITY): Payer: Self-pay

## 2022-07-11 NOTE — Progress Notes (Unsigned)
   I, Peterson Lombard, LAT, ATC acting as a scribe for Lynne Leader, MD.  Subjective:    CC: Low back pain  HPI: Patient is a 46 year old male complaining of low back pain.  Patient was previously seen by Dr. 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 Based on MRI results ESIToday, pt reports   Dx imaging: 08/08/20 L-spine MRI & XR  10/30/18 Pelvis & sacrum/coccyx XR   Pertinent review of Systems: ***  Relevant historical information: ***   Objective:   There were no vitals filed for this visit. General: Well Developed, well nourished, and in no acute distress.   MSK: ***  Lab and Radiology Results No results found for this or any previous visit (from the past 72 hour(s)). No results found.    Impression and Recommendations:    Assessment and Plan: 46 y.o. male with ***.  PDMP not reviewed this encounter. No orders of the defined types were placed in this encounter.  No orders of the defined types were placed in this encounter.   Discussed warning signs or symptoms. Please see discharge instructions. Patient expresses understanding.   ***

## 2022-07-12 ENCOUNTER — Ambulatory Visit (INDEPENDENT_AMBULATORY_CARE_PROVIDER_SITE_OTHER): Payer: Commercial Managed Care - PPO

## 2022-07-12 ENCOUNTER — Ambulatory Visit (INDEPENDENT_AMBULATORY_CARE_PROVIDER_SITE_OTHER): Payer: Commercial Managed Care - PPO | Admitting: Family Medicine

## 2022-07-12 VITALS — BP 160/98 | HR 62 | Ht 71.0 in | Wt 394.0 lb

## 2022-07-12 DIAGNOSIS — M5416 Radiculopathy, lumbar region: Secondary | ICD-10-CM | POA: Diagnosis not present

## 2022-07-12 MED ORDER — GABAPENTIN 300 MG PO CAPS: 300.0000 mg | ORAL_CAPSULE | Freq: Three times a day (TID) | ORAL | 3 refills | Status: AC | PRN

## 2022-07-12 NOTE — Patient Instructions (Addendum)
Thank you for coming in today.   Please get an Xray today before you leave   Please call [facility] at [phone] to schedule your spine injection.    Check back as needed

## 2022-07-16 NOTE — Progress Notes (Signed)
Lumbar spine x-ray looks similar to did previously.  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  We will proceed with the injection as planned and if not better we will get a new MRI.

## 2022-07-19 ENCOUNTER — Ambulatory Visit (INDEPENDENT_AMBULATORY_CARE_PROVIDER_SITE_OTHER): Payer: Commercial Managed Care - PPO | Admitting: Internal Medicine

## 2022-07-19 VITALS — BP 136/78 | HR 74 | Temp 98.3°F | Ht 71.0 in | Wt 391.0 lb

## 2022-07-19 DIAGNOSIS — Z23 Encounter for immunization: Secondary | ICD-10-CM

## 2022-07-19 DIAGNOSIS — E559 Vitamin D deficiency, unspecified: Secondary | ICD-10-CM | POA: Diagnosis not present

## 2022-07-19 DIAGNOSIS — E782 Mixed hyperlipidemia: Secondary | ICD-10-CM | POA: Diagnosis not present

## 2022-07-19 DIAGNOSIS — R7303 Prediabetes: Secondary | ICD-10-CM | POA: Diagnosis not present

## 2022-07-19 DIAGNOSIS — Z125 Encounter for screening for malignant neoplasm of prostate: Secondary | ICD-10-CM

## 2022-07-19 DIAGNOSIS — E65 Localized adiposity: Secondary | ICD-10-CM | POA: Diagnosis not present

## 2022-07-19 DIAGNOSIS — E538 Deficiency of other specified B group vitamins: Secondary | ICD-10-CM

## 2022-07-19 DIAGNOSIS — I10 Essential (primary) hypertension: Secondary | ICD-10-CM

## 2022-07-19 NOTE — Progress Notes (Signed)
Chief Complaint: follow up HTN, obesity, low vit d, hyperglycemia, hld       HPI:  Kyle Levy is a 46 y.o. male here overall doing ok, but has not been able to lose significant wt with diet and exercise;  Pt denies chest pain, increased sob or doe, wheezing, orthopnea, PND, increased LE swelling, palpitations, dizziness or syncope.   Pt denies polydipsia, polyuria, or new focal neuro s/s.  Pt denies fever, wt loss, night sweats, loss of appetite, or other constitutional symptoms  Due for flu shot.  Taking 1000 u vit d.        Wt Readings from Last 3 Encounters:  07/19/22 (!) 391 lb (177.4 kg)  07/12/22 (!) 394 lb (178.7 kg)  01/16/22 (!) 394 lb (178.7 kg)   BP Readings from Last 3 Encounters:  07/20/22 (!) 156/93  07/19/22 136/78  07/12/22 (!) 160/98         Past Medical History:  Diagnosis Date   Anxiety 08/27/2011   Back pain    HTN (hypertension) 08/27/2011   Hyperlipidemia 08/27/2011   Hypertension    Morbid obesity ([place]) 09/14/2015   OSA on CPAP    Psoriasis 09/14/2015   Past Surgical History:  Procedure Laterality Date   KNEE ARTHROSCOPY     bilateral knee    reports that he has never smoked. He has never used smokeless tobacco. He reports current alcohol use. He reports that he does not use drugs. family history includes Alcoholism in his father; Diabetes in his father; Eating disorder in his father; Heart disease in his father; Hyperlipidemia in his father; Hypertension in his father; Obesity in his father; Sleep apnea in his father and mother; Stroke in his father. No Known Allergies Current Outpatient Medications on File Prior to Visit  Medication Sig Dispense Refill   amLODipine (NORVASC) 10 MG tablet Take 1 tablet (10 mg total) by mouth daily. 90 tablet 3   atorvastatin (LIPITOR) 20 MG tablet Take 1 tablet (20 mg total) by mouth daily. 90 tablet 3   busPIRone (BUSPAR) 10 MG tablet Take 1 tablet (10 mg total) by mouth 3 (three) times daily as needed. 270  tablet 3   Ergocalciferol (VITAMIN D2 PO) Take 5,000 Units by mouth.      escitalopram (LEXAPRO) 20 MG tablet Take 1 tablet (20 mg total) by mouth daily. 90 tablet 3   gabapentin (NEURONTIN) 300 MG capsule Take 1-3 capsules (300-900 mg total) by mouth 3 (three) times daily as needed. 90 capsule 3   levocetirizine (XYZAL) 5 MG tablet Take 1 tablet (5 mg total) by mouth every evening. 90 tablet 3   losartan (COZAAR) 100 MG tablet Take 1 tablet (100 mg total) by mouth daily. 90 tablet 3   metoprolol succinate (TOPROL-XL) 50 MG 24 hr tablet Take 1 tablet (50 mg total) by mouth daily. Take with or immediately following a meal. 90 tablet 3   Multiple Vitamin (MULTIVITAMIN) tablet Take 1 tablet by mouth daily.     Semaglutide,0.25 or 0.5MG /DOS, (OZEMPIC, 0.25 OR 0.5 MG/DOSE,) 2 MG/1.5ML SOPN Inject 0.5 mg into the skin once a week. 6 mL 3   No current facility-administered medications on file prior to visit.        ROS:  All others reviewed and negative.  Objective        PE:  BP 136/78 (BP Location: Right Arm, Patient Position: Sitting, Cuff Size: Large)   Pulse 74   Temp 98.3  F (36.8 C) (Oral)   Ht 5\' 11"  (1.803 m)   Wt (!) 391 lb (177.4 kg)   SpO2 93%   BMI 54.53 kg/m                 Constitutional: Pt appears in NAD               HENT: Head: NCAT.                Right Ear: External ear normal.                 Left Ear: External ear normal.                Eyes: . Pupils are equal, round, and reactive to light. Conjunctivae and EOM are normal               Nose: without d/c or deformity               Neck: Neck supple. Gross normal ROM               Cardiovascular: Normal rate and regular rhythm.                 Pulmonary/Chest: Effort normal and breath sounds without rales or wheezing.                Abd:  Soft, NT, ND, + BS, no organomegaly               Neurological: Pt is alert. At baseline orientation, motor grossly intact               Skin: Skin is warm. No rashes, no other  new lesions, LE edema - trace bilateral               Psychiatric: Pt behavior is normal without agitation   Micro: none  Cardiac tracings I have personally interpreted today:  none  Pertinent Radiological findings (summarize): none   Lab Results  Component Value Date   WBC 9.6 01/16/2022   HGB 14.9 01/16/2022   HCT 44.0 01/16/2022   PLT 329.0 01/16/2022   GLUCOSE 93 01/16/2022   CHOL 156 01/16/2022   TRIG 336.0 (H) 01/16/2022   HDL 30.10 (L) 01/16/2022   LDLDIRECT 93.0 01/16/2022   LDLCALC 83 07/13/2021   ALT 29 01/16/2022   AST 24 01/16/2022   NA 137 01/16/2022   K 3.6 01/16/2022   CL 102 01/16/2022   CREATININE 0.81 01/16/2022   BUN 14 01/16/2022   CO2 26 01/16/2022   TSH 1.71 01/16/2022   PSA 0.47 01/16/2022   HGBA1C 6.1 01/16/2022   Assessment/Plan:  Kyle Levy is a 46 y.o. White or Caucasian [1] male with  has a past medical history of Anxiety (08/27/2011), Back pain, HTN (hypertension) (08/27/2011), Hyperlipidemia (08/27/2011), Hypertension, Morbid obesity ([place]) (09/14/2015), OSA on CPAP, and Psoriasis (09/14/2015).  Vitamin D deficiency Last vitamin D Lab Results  Component Value Date   VD25OH 36.03 01/16/2022   Low, for increased oral replacement to 2000 u qd   Visceral obesity Pt ineligible for ozempic, so for phentermine 37. 5 mg qd to next visit  Prediabetes Lab Results  Component Value Date   HGBA1C 6.1 01/16/2022   Stable, pt to continue current medical treatment ozemipc 0.5 mg weekly   Essential hypertension BP Readings from Last 3 Encounters:  07/20/22 (!) 156/93  07/19/22 136/78  07/12/22 (!) 160/98   Uncontrolled here, but pt  states controlled at home, , pt to continue medical treatment norvasc 10 mg qd, losartan 100 mg qd, toprol xl 50 mg qd   Mixed hyperlipidemia Lab Results  Component Value Date   LDLCALC 83 07/13/2021   Uncontrolled, goal ldl < 70,, pt to continue current statin liptior 20 mg as declines change med dose  for now, for lower chol diet  Followup: No follow-ups on file.  Cathlean Cower, MD 07/21/2022 3:02 PM [facility]

## 2022-07-19 NOTE — Patient Instructions (Signed)
You had the flu shot today  Please take all new medication as prescribed  - the phentermine to be sent later today  Please take OTC Vitamin D3 at 2000 units per day, indefinitely, or 4000 units if you already take the 2000 units.    Please continue all other medications as before, and refills have been done if requested.  Please have the pharmacy call with any other refills you may need.  Please continue your efforts at being more active, low cholesterol diet, and weight control.  Please keep your appointments with your specialists as you may have planned  Please make an Appointment to return in 6 months, or sooner if needed, also with Lab Appointment for testing done 3-5 days before at the [facility] (so this is for TWO appointments - please see the scheduling desk as you leave)

## 2022-07-20 ENCOUNTER — Encounter: Payer: Self-pay | Admitting: Internal Medicine

## 2022-07-20 ENCOUNTER — Ambulatory Visit
Admission: RE | Admit: 2022-07-20 | Discharge: 2022-07-20 | Disposition: A | Discharge status: Home / Self Care | Payer: Commercial Managed Care - PPO | Source: Ambulatory Visit | Attending: Family Medicine | Admitting: Family Medicine

## 2022-07-20 DIAGNOSIS — M5416 Radiculopathy, lumbar region: Secondary | ICD-10-CM

## 2022-07-20 MED ORDER — METHYLPREDNISOLONE ACETATE 40 MG/ML INJ SUSP (RADIOLOG
80.0000 mg | Freq: Once | INTRAMUSCULAR | Status: AC
  Administered 2022-07-20: 80 mg via EPIDURAL

## 2022-07-20 MED ORDER — IOPAMIDOL (ISOVUE-M 200) INJECTION 41%
1.0000 mL | Freq: Once | INTRAMUSCULAR | Status: AC
  Administered 2022-07-20: 1 mL via EPIDURAL

## 2022-07-20 MED ORDER — PHENTERMINE HCL 37.5 MG PO CAPS: 37.5000 mg | ORAL_CAPSULE | ORAL | 1 refills | Status: DC

## 2022-07-20 NOTE — Discharge Instructions (Signed)

## 2022-07-21 NOTE — Assessment & Plan Note (Signed)
Pt ineligible for ozempic, so for phentermine 37. 5 mg qd to next visit

## 2022-07-21 NOTE — Assessment & Plan Note (Signed)
Last vitamin D Lab Results  Component Value Date   VD25OH 36.03 01/16/2022   Low, for increased oral replacement to 2000 u qd

## 2022-07-21 NOTE — Assessment & Plan Note (Signed)
Lab Results  Component Value Date   HGBA1C 6.1 01/16/2022   Stable, pt to continue current medical treatment ozemipc 0.5 mg weekly

## 2022-07-21 NOTE — Assessment & Plan Note (Signed)
Lab Results  Component Value Date   LDLCALC 83 07/13/2021   Uncontrolled, goal ldl < 70,, pt to continue current statin liptior 20 mg as declines change med dose for now, for lower chol diet

## 2022-07-21 NOTE — Assessment & Plan Note (Signed)
BP Readings from Last 3 Encounters:  07/20/22 (!) 156/93  07/19/22 136/78  07/12/22 (!) 160/98   Uncontrolled here, but pt states controlled at home, , pt to continue medical treatment norvasc 10 mg qd, losartan 100 mg qd, toprol xl 50 mg qd

## 2022-07-30 ENCOUNTER — Encounter: Payer: Self-pay | Admitting: Internal Medicine

## 2022-09-10 ENCOUNTER — Other Ambulatory Visit: Payer: Self-pay

## 2022-09-10 ENCOUNTER — Telehealth: Payer: Self-pay | Admitting: Internal Medicine

## 2022-09-10 MED ORDER — LOSARTAN POTASSIUM 100 MG PO TABS: 100.0000 mg | ORAL_TABLET | Freq: Every day | ORAL | 3 refills | Status: DC

## 2022-09-10 MED ORDER — AMLODIPINE BESYLATE 10 MG PO TABS: 10.0000 mg | ORAL_TABLET | Freq: Every day | ORAL | 3 refills | Status: DC

## 2022-09-10 MED ORDER — ATORVASTATIN CALCIUM 20 MG PO TABS: 20.0000 mg | ORAL_TABLET | Freq: Every day | ORAL | 3 refills | Status: DC

## 2022-09-10 NOTE — Telephone Encounter (Signed)
Rx requests sent to pharmacy,patient informed

## 2022-09-10 NOTE — Telephone Encounter (Signed)
Caller & Relationship to patient: Jobany  Call back number: [phone]  Date of last office visit: 07/19/22  Date of next office visit: 01/18/23  Medication(s) to be refilled: amLODipine (NORVASC) 10 MG tablet.  losartan (COZAAR) 100 MG tablet  atorvastatin (LIPITOR) 20 MG tablet. Patient is out of this medication  Preferred Pharmacy: [facility], Address: [address], [place], [place] [address]

## 2022-10-10 ENCOUNTER — Telehealth: Payer: Commercial Managed Care - PPO | Admitting: Physician Assistant

## 2022-10-10 ENCOUNTER — Encounter: Payer: Self-pay | Admitting: Internal Medicine

## 2022-10-10 DIAGNOSIS — J069 Acute upper respiratory infection, unspecified: Secondary | ICD-10-CM | POA: Diagnosis not present

## 2022-10-10 MED ORDER — BENZONATATE 100 MG PO CAPS: 100.0000 mg | ORAL_CAPSULE | Freq: Three times a day (TID) | ORAL | 0 refills | Status: DC | PRN

## 2022-10-10 MED ORDER — AZELASTINE HCL 0.1 % NA SOLN: 1.0000 | Freq: Two times a day (BID) | NASAL | 0 refills | Status: AC

## 2022-10-10 MED ORDER — PROMETHAZINE-DM 6.25-15 MG/5ML PO SYRP: 5.0000 mL | ORAL_SOLUTION | Freq: Four times a day (QID) | ORAL | 0 refills | Status: DC | PRN

## 2022-10-10 NOTE — Progress Notes (Signed)
E-Visit for Upper Respiratory Infection   We are sorry you are not feeling well.  Here is how we plan to help!  Based on what you have shared with me, it looks like you may have a viral upper respiratory infection.  Upper respiratory infections are caused by a large number of viruses; however, rhinovirus is the most common cause.   Symptoms vary from person to person, with common symptoms including sore throat, cough, fatigue or lack of energy and feeling of general discomfort.  A low-grade fever of up to 100.4 may present, but is often uncommon.  Symptoms vary however, and are closely related to a person's age or underlying illnesses.  The most common symptoms associated with an upper respiratory infection are nasal discharge or congestion, cough, sneezing, headache and pressure in the ears and face.  These symptoms usually persist for about 3 to 10 days, but can last up to 2 weeks.  It is important to know that upper respiratory infections do not cause serious illness or complications in most cases.    Upper respiratory infections can be transmitted from person to person, with the most common method of transmission being a person's hands.  The virus is able to live on the skin and can infect other persons for up to 2 hours after direct contact.  Also, these can be transmitted when someone coughs or sneezes; thus, it is important to cover the mouth to reduce this risk.  To keep the spread of the illness at [place], good hand hygiene is very important.  This is an infection that is most likely caused by a virus. There are no specific treatments other than to help you with the symptoms until the infection runs its course.  We are sorry you are not feeling well.  Here is how we plan to help!   For nasal congestion, you may use an oral decongestants such as Mucinex D or if you have glaucoma or high blood pressure use plain Mucinex.  Saline nasal spray or nasal drops can help and can safely be used as often as  needed for congestion.  For your congestion, I have prescribed Azelastine nasal spray two sprays in each nostril twice a day  If you do not have a history of heart disease, hypertension, diabetes or thyroid disease, prostate/bladder issues or glaucoma, you may also use Sudafed to treat nasal congestion.  It is highly recommended that you consult with a pharmacist or your primary care physician to ensure this medication is safe for you to take.     If you have a cough, you may use cough suppressants such as Delsym and Robitussin.  If you have glaucoma or high blood pressure, you can also use Coricidin HBP.   For cough I have prescribed for you A prescription cough medication called Tessalon Perles 100 mg. You may take 1-2 capsules every 8 hours as needed for cough and Promethazine DM cough syrup Take 71mL every 6 hours as needed for cough. This can be used together with Benzonatate (Tessalon).   If you have a sore or scratchy throat, use a saltwater gargle-  to  teaspoon of salt dissolved in a 4-ounce to 8-ounce glass of warm water.  Gargle the solution for approximately 15-30 seconds and then spit.  It is important not to swallow the solution.  You can also use throat lozenges/cough drops and Chloraseptic spray to help with throat pain or discomfort.  Warm or cold liquids can also be helpful in  relieving throat pain.  For headache, pain or general discomfort, you can use Ibuprofen or Tylenol as directed.   Some authorities believe that zinc sprays or the use of Echinacea may shorten the course of your symptoms.   HOME CARE Only take medications as instructed by your medical team. Be sure to drink plenty of fluids. Water is fine as well as fruit juices, sodas and electrolyte beverages. You may want to stay away from caffeine or alcohol. If you are nauseated, try taking small sips of liquids. How do you know if you are getting enough fluid? Your urine should be a pale yellow or almost colorless. Get  rest. Taking a steamy shower or using a humidifier may help nasal congestion and ease sore throat pain. You can place a towel over your head and breathe in the steam from hot water coming from a faucet. Using a saline nasal spray works much the same way. Cough drops, hard candies and sore throat lozenges may ease your cough. Avoid close contacts especially the very young and the elderly Cover your mouth if you cough or sneeze Always remember to wash your hands.   GET HELP RIGHT AWAY IF: You develop worsening fever. If your symptoms do not improve within 10 days You develop yellow or green discharge from your nose over 3 days. You have coughing fits You develop a severe head ache or visual changes. You develop shortness of breath, difficulty breathing or start having chest pain Your symptoms persist after you have completed your treatment plan  MAKE SURE YOU  Understand these instructions. Will watch your condition. Will get help right away if you are not doing well or get worse.  Thank you for choosing an e-visit.  Your e-visit answers were reviewed by a board certified advanced clinical practitioner to complete your personal care plan. Depending upon the condition, your plan could have included both over the counter or prescription medications.  Please review your pharmacy choice. Make sure the pharmacy is open so you can pick up prescription now. If there is a problem, you may contact your provider through [employer] and have the prescription routed to another pharmacy.  Your safety is important to [place]. If you have drug allergies check your prescription carefully.   For the next 24 hours you can use MyChart to ask questions about today's visit, request a non-urgent call back, or ask for a work or school excuse. You will get an email in the next two days asking about your experience. I hope that your e-visit has been valuable and will speed your recovery.   I have spent 5  minutes in review of e-visit questionnaire, review and updating patient chart, medical decision making and response to patient.   Mar Daring, PA-C

## 2022-10-10 NOTE — Telephone Encounter (Signed)
Called pt to inform him that the time would be 1:40 not 1:00. Pt refuse the time he states he wanted to see someone now. Inform pt since he has mychart he can go to evisit and scheduled e-viisit w/ any cone dr for cough. Pt states he would do that instead.Kyle KitchenJohny Chess

## 2022-10-12 ENCOUNTER — Telehealth: Payer: Commercial Managed Care - PPO | Admitting: Internal Medicine

## 2022-12-07 ENCOUNTER — Other Ambulatory Visit: Payer: Self-pay | Admitting: Internal Medicine

## 2022-12-11 ENCOUNTER — Ambulatory Visit (INDEPENDENT_AMBULATORY_CARE_PROVIDER_SITE_OTHER): Payer: Commercial Managed Care - PPO | Admitting: Nurse Practitioner

## 2022-12-11 ENCOUNTER — Ambulatory Visit (INDEPENDENT_AMBULATORY_CARE_PROVIDER_SITE_OTHER): Payer: Commercial Managed Care - PPO

## 2022-12-11 ENCOUNTER — Encounter: Payer: Self-pay | Admitting: Nurse Practitioner

## 2022-12-11 VITALS — BP 132/78 | HR 84 | Ht 71.0 in | Wt 388.8 lb

## 2022-12-11 DIAGNOSIS — R052 Subacute cough: Secondary | ICD-10-CM | POA: Diagnosis not present

## 2022-12-11 DIAGNOSIS — J069 Acute upper respiratory infection, unspecified: Secondary | ICD-10-CM

## 2022-12-11 DIAGNOSIS — G4733 Obstructive sleep apnea (adult) (pediatric): Secondary | ICD-10-CM | POA: Diagnosis not present

## 2022-12-11 DIAGNOSIS — R058 Other specified cough: Secondary | ICD-10-CM

## 2022-12-11 DIAGNOSIS — J4521 Mild intermittent asthma with (acute) exacerbation: Secondary | ICD-10-CM | POA: Diagnosis not present

## 2022-12-11 DIAGNOSIS — J45909 Unspecified asthma, uncomplicated: Secondary | ICD-10-CM | POA: Insufficient documentation

## 2022-12-11 DIAGNOSIS — K219 Gastro-esophageal reflux disease without esophagitis: Secondary | ICD-10-CM

## 2022-12-11 MED ORDER — OMEPRAZOLE 40 MG PO CPDR: 40.0000 mg | DELAYED_RELEASE_CAPSULE | Freq: Every day | ORAL | 1 refills | Status: AC

## 2022-12-11 MED ORDER — AZITHROMYCIN 250 MG PO TABS: ORAL_TABLET | ORAL | 0 refills | Status: DC

## 2022-12-11 MED ORDER — FLUTICASONE PROPIONATE 50 MCG/ACT NA SUSP: 1.0000 | Freq: Every day | NASAL | 2 refills | Status: DC

## 2022-12-11 MED ORDER — PREDNISONE 20 MG PO TABS: 40.0000 mg | ORAL_TABLET | Freq: Every day | ORAL | 0 refills | Status: AC

## 2022-12-11 MED ORDER — BENZONATATE 200 MG PO CAPS: 200.0000 mg | ORAL_CAPSULE | Freq: Three times a day (TID) | ORAL | 1 refills | Status: DC | PRN

## 2022-12-11 MED ORDER — PROMETHAZINE-DM 6.25-15 MG/5ML PO SYRP: 5.0000 mL | ORAL_SOLUTION | Freq: Four times a day (QID) | ORAL | 0 refills | Status: DC | PRN

## 2022-12-11 MED ORDER — BECLOMETHASONE DIPROP HFA 80 MCG/ACT IN AERB
2.0000 | INHALATION_SPRAY | Freq: Two times a day (BID) | RESPIRATORY_TRACT | 6 refills | Status: DC
  Filled 2023-11-04: qty 10.6, 30d supply, fill #0

## 2022-12-11 NOTE — Assessment & Plan Note (Signed)
Mild OSA, on CPAP. Excellent compliance and control of events. Receives good benefit from use. He is due for a new machine. We will place orders for this today. Understands proper care/use of device. Cautioned on safe driving practices.  Patient Instructions  Continue Xyzal 1 tab daily Continue astelin 1 spray each nostril Twice daily for nasal congestion  Start flonase nasal spray 2 sprays each nostril daily  Omeprazole 1 tab daily - take 30 min before breakfast  Prednisone 40 mg daily for 5 days. Take in AM with food Benzonatate 1 capsule Three times a day for cough Promethazine DM 5 mL every 6 hours as needed for cough. Do not drive after taking. May cause drowsiness  Qvar inhaler 2 puffs Twice daily until cough resolves then as needed for shortness of breath, cough, wheezing. Brush tongue and rinse mouth afterwards   Upper airway cough syndrome: Suppress your cough to allow your larynx (voice box) to heal.  Limit talking for the next few days. Avoid throat clearing. Work on cough suppression with the above recommended suppressants.  Use sugar free hard candies or non-menthol cough drops during this time to soothe your throat.  Warm tea with honey and lemon.   Chest x ray today   Continue to use CPAP every night, minimum of 4-6 hours a night.  Change equipment every 30 days or as directed by DME. Wash your tubing with warm soap and water daily, hang to dry. Wash humidifier portion weekly.  Be aware of reduced alertness and do not drive or operate heavy machinery if experiencing this or drowsiness.  Exercise encouraged, as tolerated. Notify if persistent daytime sleepiness occurs even with consistent use of CPAP.  Follow up in 6 weeks to see how the cough is with Dr. Annamaria Boots. If symptoms do not improve or worsen, please contact office for sooner follow up or seek emergency care.

## 2022-12-11 NOTE — Assessment & Plan Note (Signed)
Upper airway irritation. See above. Cough control measures advised. Target postnasal drainage and GERD.

## 2022-12-11 NOTE — Progress Notes (Signed)
Please  notify patient CXR did not show any evidence of pna. I am going to cover him with a z pack for any atypical pathogens or possible pertussis given his subacute cough. He should continue our other recommendations as well. Thanks

## 2022-12-11 NOTE — Assessment & Plan Note (Addendum)
Cough variant asthma/reactive airway disease with acute exacerbation? Suspect brought on by viral URI. FeNO elevated today at 34 ppb. CXR without superimposed infection. Likely with upper airway component. We will treat him with prednisone burst and start him on ICS. Empiric course of azithromycin for atypical pathogens/pertussis? Side effect profile reviewed. Proper inhaler technique reviewed. Action plan in place.

## 2022-12-11 NOTE — Assessment & Plan Note (Signed)
Optimize measures to control GERD. See above

## 2022-12-11 NOTE — Patient Instructions (Addendum)
Continue Xyzal 1 tab daily Continue astelin 1 spray each nostril Twice daily for nasal congestion  Start flonase nasal spray 2 sprays each nostril daily  Omeprazole 1 tab daily - take 30 min before breakfast  Prednisone 40 mg daily for 5 days. Take in AM with food Benzonatate 1 capsule Three times a day for cough Promethazine DM 5 mL every 6 hours as needed for cough. Do not drive after taking. May cause drowsiness  Qvar inhaler 2 puffs Twice daily until cough resolves then as needed for shortness of breath, cough, wheezing. Brush tongue and rinse mouth afterwards   Upper airway cough syndrome: Suppress your cough to allow your larynx (voice box) to heal.  Limit talking for the next few days. Avoid throat clearing. Work on cough suppression with the above recommended suppressants.  Use sugar free hard candies or non-menthol cough drops during this time to soothe your throat.  Warm tea with honey and lemon.   Chest x ray today   Continue to use CPAP every night, minimum of 4-6 hours a night.  Change equipment every 30 days or as directed by DME. Wash your tubing with warm soap and water daily, hang to dry. Wash humidifier portion weekly.  Be aware of reduced alertness and do not drive or operate heavy machinery if experiencing this or drowsiness.  Exercise encouraged, as tolerated. Notify if persistent daytime sleepiness occurs even with consistent use of CPAP.  Follow up in 6 weeks to see how the cough is with Dr. Annamaria Boots. If symptoms do not improve or worsen, please contact office for sooner follow up or seek emergency care.

## 2022-12-11 NOTE — Progress Notes (Signed)
@Patient  ID: Kyle Levy, male    DOB: December 10, 1975, 47 y.o.   MRN: [id number]  Chief Complaint  Patient presents with   Consult    OSA on CPAP Epworth 2     Referring provider: Biagio Borg, MD  HPI: 47 year old male, never smoker referred for sleep consult to re-establish care. He was previously followed by Dr. Annamaria Boots and last seen 10/09/2018. Past medical history significant for HTN, allergic rhinitis, psoriasis, HLD, anxiety, obesity, vit d deficiency, prediabetes.   TEST/EVENTS:  04/11/2017 HST: AHI 12.8/h, SpO2 low 80%.  Body weight 374 lb  12/11/2022: Today - sleep consult Patient presents today for sleep consult, to reestablish care for OSA on CPAP.  He has a history of mild sleep apnea originally diagnosed in 2018 with an AHI of 12.8.  He has been on CPAP therapy since.  He receives good benefit from use.  Feels like he sleeps well with this at night.  Wearing a fullface mask.  No concerns or complaints.  Denies any excessive daytime fatigue, morning headaches, dry mouth, drowsy driving.  He does want to see if he is able to qualify for new machine as he has had his for going on for 5 years if not longer. He goes to bed between 10 to 11 PM.  Falls asleep within 15 minutes.  Wakes 1-2 times a night to use the bathroom.  Gets up in the morning around 4:30 AM.  No sleep aids. 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  His weight has fluctuated 10 to 20 pounds over the last 2 years.  Last sleep study was in 2018. Past history of hypertension, well-controlled on antihypertensives.  No history of stroke or diabetes.  He is a never smoker.  Drinks 1-2 alcoholic beverages a week.  No excessive caffeine intake.  Lives at home with his wife and 2 kids.  Works as a Photographer.  Family history of heart disease.  He also wanted to be seen today to discuss a cough that he has had over the last 1 to 2 months.  He had a viral illness around mid January.  He was treated with  supportive care measures and also provided Tessalon Perles and Promethazine DM cough syrup.  This helped for short period but then his cough returned and has persisted since.  He describes it as a dry cough.  Occasionally has a throat tickle or feels the need to clear his throat.  Does have some postnasal drainage but has been using Astelin, which helps with this some. Some shortness of breath with coughing spells. He denies any shortness of breath with exertion, wheezing, chest congestion, fevers, chills, hemoptysis.  No lower extremity swelling, orthopnea or PND.  He is currently taking Xyzal and still using the Astelin nasal spray.  Not currently taking any cough suppressants.  He is a never smoker.  Does not have a history of childhood asthma.  He does have trouble with his allergies, which he thought may be the cause of the cough.  No significant environmental or occupational exposures.  He has not had any chest x-rays, antibiotics or steroids.  Epworth 2  09/12/2022-12/10/2022: CPAP 5-15 cmH2O 90/90 days; 99% >4 hr; av use 6 hr 44 min Pressure 95th 10.4 Leaks 95th 3.2 AHI 0.4  No Known Allergies  Immunization History  Administered Date(s) Administered   Influenza,inj,Quad PF,6+ Mos 05/28/2018, 06/04/2019, 07/12/2020, 07/14/2021, 07/19/2022   PFIZER(Purple Top)SARS-COV-2 Vaccination 10/11/2019, 10/31/2019, 08/02/2020  Tdap 10/06/2013, 10/30/2018    Past Medical History:  Diagnosis Date   Anxiety 08/27/2011   Back pain    HTN (hypertension) 08/27/2011   Hyperlipidemia 08/27/2011   Hypertension    Morbid obesity ([place]) 09/14/2015   OSA on CPAP    Psoriasis 09/14/2015    Tobacco History: Social History   Tobacco Use  Smoking Status Never  Smokeless Tobacco Never   Counseling given: Not Answered   Outpatient Medications Prior to Visit  Medication Sig Dispense Refill   amLODipine (NORVASC) 10 MG tablet Take 1 tablet (10 mg total) by mouth daily. 90 tablet 3   atorvastatin  (LIPITOR) 20 MG tablet Take 1 tablet (20 mg total) by mouth daily. 90 tablet 3   azelastine (ASTELIN) 0.1 % nasal spray Place 1 spray into both nostrils 2 (two) times daily. Use in each nostril as directed 30 mL 0   busPIRone (BUSPAR) 10 MG tablet Take 1 tablet (10 mg total) by mouth 3 (three) times daily as needed. 270 tablet 3   Ergocalciferol (VITAMIN D2 PO) Take 5,000 Units by mouth.      escitalopram (LEXAPRO) 20 MG tablet Take 1 tablet (20 mg total) by mouth daily. 90 tablet 3   gabapentin (NEURONTIN) 300 MG capsule Take 1-3 capsules (300-900 mg total) by mouth 3 (three) times daily as needed. 90 capsule 3   levocetirizine (XYZAL) 5 MG tablet Take 1 tablet (5 mg total) by mouth every evening. 90 tablet 3   losartan (COZAAR) 100 MG tablet Take 1 tablet (100 mg total) by mouth daily. 90 tablet 3   metoprolol succinate (TOPROL-XL) 50 MG 24 hr tablet Take 1 tablet (50 mg total) by mouth daily. Take with or immediately following a meal. 90 tablet 3   Multiple Vitamin (MULTIVITAMIN) tablet Take 1 tablet by mouth daily.     phentermine 37.5 MG capsule Take 1 capsule (37.5 mg total) by mouth every morning. 90 capsule 1   Semaglutide,0.25 or 0.5MG /DOS, (OZEMPIC, 0.25 OR 0.5 MG/DOSE,) 2 MG/1.5ML SOPN Inject 0.5 mg into the skin once a week. 6 mL 3   benzonatate (TESSALON) 100 MG capsule Take 1 capsule (100 mg total) by mouth 3 (three) times daily as needed. 30 capsule 0   promethazine-dextromethorphan (PROMETHAZINE-DM) 6.25-15 MG/5ML syrup Take 5 mLs by mouth 4 (four) times daily as needed. 118 mL 0   No facility-administered medications prior to visit.     Review of Systems:   Constitutional: No weight loss or gain, night sweats, fevers, chills, fatigue, or lassitude. HEENT: No headaches, difficulty swallowing, tooth/dental problems, or sore throat. No sneezing, itching, ear ache, nasal congestion. +post nasal drip CV:  No chest pain, orthopnea, PND, swelling in lower extremities, anasarca,  dizziness, palpitations, syncope Resp: ++dry cough. No shortness of breath with exertion or at rest. No hemoptysis. No wheezing.  No chest wall deformity GI:  No heartburn, indigestion, abdominal pain, nausea, vomiting, diarrhea, change in bowel habits, loss of appetite, bloody stools.  GU: No dysuria, change in color of urine, urgency or frequency.  Skin: No rash, lesions, ulcerations MSK:  No joint pain or swelling.   Neuro: No dizziness or lightheadedness.  Psych: No depression or anxiety. Mood stable.     Physical Exam:  BP 132/78 (BP Location: Left Arm, Patient Position: Sitting, Cuff Size: Large)   Pulse 84   Ht 5\' 11"  (1.803 m)   Wt (!) 388 lb 12.8 oz (176.4 kg)   SpO2 98%   BMI 54.23 kg/m  GEN: Pleasant, interactive, well-appearing; obese; in no acute distress. HEENT:  Normocephalic and atraumatic. PERRLA. Sclera white. Nasal turbinates pink, moist and patent bilaterally. No rhinorrhea present. Oropharynx pink and moist, without exudate or edema. No lesions, ulcerations, or postnasal drip.  NECK:  Supple w/ fair ROM. No JVD present. Normal carotid impulses w/o bruits. Thyroid symmetrical with no goiter or nodules palpated. No lymphadenopathy.   CV: RRR, no m/r/g, no peripheral edema. Pulses intact, +2 bilaterally. No cyanosis, pallor or clubbing. PULMONARY:  Unlabored, regular breathing. Clear bilaterally A&P w/o wheezes/rales/rhonchi. No accessory muscle use.  GI: BS present and normoactive. Soft, non-tender to palpation. No organomegaly or masses detected. MSK: No erythema, warmth or tenderness. Cap refil <2 sec all extrem. No deformities or joint swelling noted.  Neuro: A/Ox3. No focal deficits noted.   Skin: Warm, no lesions or rashe Psych: Normal affect and behavior. Judgement and thought content appropriate.     Lab Results:  CBC    Component Value Date/Time   WBC 9.6 01/16/2022 0917   RBC 5.23 01/16/2022 0917   HGB 14.9 01/16/2022 0917   HGB 15.1 11/16/2020  0734   HCT 44.0 01/16/2022 0917   HCT 45.5 11/16/2020 0734   PLT 329.0 01/16/2022 0917   PLT 345 11/16/2020 0734   MCV 84.1 01/16/2022 0917   MCV 84 11/16/2020 0734   MCH 27.9 11/16/2020 0734   MCHC 33.9 01/16/2022 0917   RDW 13.7 01/16/2022 0917   RDW 13.9 11/16/2020 0734   LYMPHSABS 1.8 01/16/2022 0917   LYMPHSABS 1.6 11/16/2020 0734   MONOABS 0.6 01/16/2022 0917   EOSABS 0.3 01/16/2022 0917   EOSABS 0.2 11/16/2020 0734   BASOSABS 0.0 01/16/2022 0917   BASOSABS 0.1 11/16/2020 0734    BMET    Component Value Date/Time   NA 137 01/16/2022 0917   NA 137 11/16/2020 0734   K 3.6 01/16/2022 0917   CL 102 01/16/2022 0917   CO2 26 01/16/2022 0917   GLUCOSE 93 01/16/2022 0917   BUN 14 01/16/2022 0917   BUN 12 11/16/2020 0734   CREATININE 0.81 01/16/2022 0917   CALCIUM 9.0 01/16/2022 0917   GFRNONAA 109 11/16/2020 0734   GFRAA 126 11/16/2020 0734    BNP No results found for: "BNP"   Imaging:  DG Chest 2 View  Result Date: 12/11/2022 CLINICAL DATA:  Cough EXAM: CHEST - 2 VIEW COMPARISON:  08/22/2010 FINDINGS: Cardiac size is within normal limits. There are no signs of pulmonary edema or focal pulmonary consolidation. There is no pleural effusion or pneumothorax. IMPRESSION: There are no signs of pulmonary edema or focal pulmonary consolidation. Electronically Signed   By: Elmer Picker M.D.   On: 12/11/2022 09:49          No data to display          No results found for: "NITRICOXIDE"      Assessment & Plan:   Obstructive sleep apnea Mild OSA, on CPAP. Excellent compliance and control of events. Receives good benefit from use. He is due for a new machine. We will place orders for this today. Understands proper care/use of device. Cautioned on safe driving practices.  Patient Instructions  Continue Xyzal 1 tab daily Continue astelin 1 spray each nostril Twice daily for nasal congestion  Start flonase nasal spray 2 sprays each nostril daily   Omeprazole 1 tab daily - take 30 min before breakfast  Prednisone 40 mg daily for 5 days. Take in AM with food Benzonatate 1 capsule  Three times a day for cough Promethazine DM 5 mL every 6 hours as needed for cough. Do not drive after taking. May cause drowsiness  Qvar inhaler 2 puffs Twice daily until cough resolves then as needed for shortness of breath, cough, wheezing. Brush tongue and rinse mouth afterwards   Upper airway cough syndrome: Suppress your cough to allow your larynx (voice box) to heal.  Limit talking for the next few days. Avoid throat clearing. Work on cough suppression with the above recommended suppressants.  Use sugar free hard candies or non-menthol cough drops during this time to soothe your throat.  Warm tea with honey and lemon.   Chest x ray today   Continue to use CPAP every night, minimum of 4-6 hours a night.  Change equipment every 30 days or as directed by DME. Wash your tubing with warm soap and water daily, hang to dry. Wash humidifier portion weekly.  Be aware of reduced alertness and do not drive or operate heavy machinery if experiencing this or drowsiness.  Exercise encouraged, as tolerated. Notify if persistent daytime sleepiness occurs even with consistent use of CPAP.  Follow up in 6 weeks to see how the cough is with Dr. Annamaria Boots. If symptoms do not improve or worsen, please contact office for sooner follow up or seek emergency care.    Asthmatic bronchitis Cough variant asthma/reactive airway disease with acute exacerbation? Suspect brought on by viral URI. FeNO elevated today at 34 ppb. CXR without superimposed infection. Likely with upper airway component. We will treat him with prednisone burst and start him on ICS. Empiric course of azithromycin for atypical pathogens/pertussis? Side effect profile reviewed. Proper inhaler technique reviewed. Action plan in place.    Upper airway cough syndrome Upper airway irritation. See above. Cough control  measures advised. Target postnasal drainage and GERD.   GERD (gastroesophageal reflux disease) Optimize measures to control GERD. See above  I spent 45 minutes of dedicated to the care of this patient on the date of this encounter to include pre-visit review of records, face-to-face time with the patient discussing conditions above, post visit ordering of testing, clinical documentation with the electronic health record, making appropriate referrals as documented, and communicating necessary findings to members of the patients care team.  Clayton Bibles, NP 12/11/2022  Pt aware and understands NP's role.

## 2023-01-14 ENCOUNTER — Other Ambulatory Visit: Payer: Self-pay | Admitting: Internal Medicine

## 2023-01-14 ENCOUNTER — Other Ambulatory Visit (INDEPENDENT_AMBULATORY_CARE_PROVIDER_SITE_OTHER): Payer: Commercial Managed Care - PPO

## 2023-01-14 DIAGNOSIS — Z125 Encounter for screening for malignant neoplasm of prostate: Secondary | ICD-10-CM

## 2023-01-14 DIAGNOSIS — E782 Mixed hyperlipidemia: Secondary | ICD-10-CM | POA: Diagnosis not present

## 2023-01-14 DIAGNOSIS — R7303 Prediabetes: Secondary | ICD-10-CM | POA: Diagnosis not present

## 2023-01-14 DIAGNOSIS — E538 Deficiency of other specified B group vitamins: Secondary | ICD-10-CM | POA: Diagnosis not present

## 2023-01-14 DIAGNOSIS — E559 Vitamin D deficiency, unspecified: Secondary | ICD-10-CM | POA: Diagnosis not present

## 2023-01-14 LAB — URINALYSIS, ROUTINE W REFLEX MICROSCOPIC
Bilirubin Urine: NEGATIVE
Hgb urine dipstick: NEGATIVE
Ketones, ur: NEGATIVE
Leukocytes,Ua: NEGATIVE
Nitrite: NEGATIVE
Specific Gravity, Urine: 1.02 (ref 1.000–1.030)
Total Protein, Urine: NEGATIVE
Urine Glucose: NEGATIVE
Urobilinogen, UA: 0.2 (ref 0.0–1.0)
pH: 7.5 (ref 5.0–8.0)

## 2023-01-14 LAB — MICROALBUMIN / CREATININE URINE RATIO
Creatinine,U: 127.5 mg/dL
Microalb Creat Ratio: 2 mg/g (ref 0.0–30.0)
Microalb, Ur: 2.5 mg/dL — ABNORMAL HIGH (ref 0.0–1.9)

## 2023-01-14 LAB — CBC WITH DIFFERENTIAL/PLATELET
Basophils Absolute: 0.1 10*3/uL (ref 0.0–0.1)
Basophils Relative: 0.8 % (ref 0.0–3.0)
Eosinophils Absolute: 0.1 10*3/uL (ref 0.0–0.7)
Eosinophils Relative: 1.3 % (ref 0.0–5.0)
HCT: 45.8 % (ref 39.0–52.0)
Hemoglobin: 15.3 g/dL (ref 13.0–17.0)
Lymphocytes Relative: 21.9 % (ref 12.0–46.0)
Lymphs Abs: 1.6 10*3/uL (ref 0.7–4.0)
MCHC: 33.5 g/dL (ref 30.0–36.0)
MCV: 84.8 fl (ref 78.0–100.0)
Monocytes Absolute: 0.6 10*3/uL (ref 0.1–1.0)
Monocytes Relative: 7.6 % (ref 3.0–12.0)
Neutro Abs: 5.1 10*3/uL (ref 1.4–7.7)
Neutrophils Relative %: 68.4 % (ref 43.0–77.0)
Platelets: 352 10*3/uL (ref 150.0–400.0)
RBC: 5.4 Mil/uL (ref 4.22–5.81)
RDW: 14.2 % (ref 11.5–15.5)
WBC: 7.4 10*3/uL (ref 4.0–10.5)

## 2023-01-14 LAB — BASIC METABOLIC PANEL
BUN: 12 mg/dL (ref 6–23)
CO2: 28 mEq/L (ref 19–32)
Calcium: 9.4 mg/dL (ref 8.4–10.5)
Chloride: 100 mEq/L (ref 96–112)
Creatinine, Ser: 0.9 mg/dL (ref 0.40–1.50)
GFR: 103.08 mL/min (ref >60.00)
Glucose, Bld: 98 mg/dL (ref 70–99)
Potassium: 4.2 mEq/L (ref 3.5–5.1)
Sodium: 137 mEq/L (ref 135–145)

## 2023-01-14 LAB — HEPATIC FUNCTION PANEL
ALT: 28 U/L (ref 0–53)
AST: 22 U/L (ref 0–37)
Albumin: 4.5 g/dL (ref 3.5–5.2)
Alkaline Phosphatase: 88 U/L (ref 39–117)
Bilirubin, Direct: 0.1 mg/dL (ref 0.0–0.3)
Total Bilirubin: 0.7 mg/dL (ref 0.2–1.2)
Total Protein: 7.7 g/dL (ref 6.0–8.3)

## 2023-01-14 LAB — PSA: PSA: 0.74 ng/mL (ref 0.10–4.00)

## 2023-01-14 LAB — VITAMIN B12: Vitamin B-12: 285 pg/mL (ref 211–911)

## 2023-01-14 LAB — LIPID PANEL
Cholesterol: 137 mg/dL (ref 0–200)
HDL: 34.5 mg/dL — ABNORMAL LOW (ref >39.00)
LDL Cholesterol: 73 mg/dL (ref 0–99)
NonHDL: 102.03
Total CHOL/HDL Ratio: 4
Triglycerides: 143 mg/dL (ref 0.0–149.0)
VLDL: 28.6 mg/dL (ref 0.0–40.0)

## 2023-01-14 LAB — HEMOGLOBIN A1C: Hgb A1c MFr Bld: 5.9 % (ref 4.6–6.5)

## 2023-01-14 LAB — TSH: TSH: 1.58 u[IU]/mL (ref 0.35–5.50)

## 2023-01-14 LAB — VITAMIN D 25 HYDROXY (VIT D DEFICIENCY, FRACTURES): VITD: 43.1 ng/mL (ref 30.00–100.00)

## 2023-01-18 ENCOUNTER — Ambulatory Visit: Payer: Commercial Managed Care - PPO | Admitting: Internal Medicine

## 2023-01-20 NOTE — Progress Notes (Deleted)
HPI male never smoker followed for OSA, complicated by morbid obesity Unattended Home Sleep Test 04/01/17-AHI 12.8/hour, desaturation to 80%, body weight 374 pounds  ----------------------------------------------------------------------   10/09/2018- 47 year old male never smoker followed for OSA, complicated by morbid obesity ---3 month follow up, acute symptoms -----OSA; DME: [name] wears CPAP nightly and DL attached.  No new supplies needed.  CPAP auto 5-15/Advanced Weight today 380 pounds Download 87% compliance AHI 0.5/hour He feels he is doing well. Comfortable withj current settings.   01/21/23- 47 year old male never smoker followed for OSA, complicated by morbid obesity, Allergic Rhinitis, Asthmatic Bronchitis, GERD,   CPAP auto 5-15/Adapt  Weight today  Download 87% compliance [name], NP 12/11/22-CPAP f/u and cough. Ordered replacement for old CPAP. Asthmatic bronchitis, GERD CXR 12/11/22-  FINDINGS: Cardiac size is within normal limits. There are no signs of pulmonary edema or focal pulmonary consolidation. There is no pleural effusion or pneumothorax. IMPRESSION: There are no signs of pulmonary edema or focal pulmonary consolidation.     ROS-see HPI   + = positive Constitutional:    weight loss, night sweats, fevers, chills, fatigue, lassitude. HEENT:    headaches, difficulty swallowing, tooth/dental problems, sore throat,       sneezing, itching, ear ache, + nasal congestion, post nasal drip, snoring CV:    chest pain, orthopnea, PND, swelling in lower extremities, anasarca,                                                     dizziness, palpitations Resp:   shortness of breath with exertion or at rest.                productive cough,   non-productive cough, coughing up of blood.              change in color of mucus.  wheezing.   Skin:    rash or lesions. GI:  No-   heartburn, indigestion, abdominal pain, nausea, vomiting, diarrhea,                 change in bowel  habits, loss of appetite GU: dysuria, change in color of urine, no urgency or frequency.   flank pain. MS:   joint pain, stiffness, decreased range of motion, back pain. Neuro-     nothing unusual Psych:  change in mood or affect.  depression or anxiety.   memory loss.  OBJ- Physical Exam General- Alert, Oriented, Affect-appropriate, Distress- none acute + morbidly obese/big man Skin- rash-none, lesions- none, excoriation- none Lymphadenopathy- none Head- atraumatic            Eyes- Gross vision intact, PERRLA, conjunctivae and secretions clear            Ears- Hearing, canals-normal            Nose-+ mild nasal congestion, no-Septal dev, mucus, polyps, erosion, perforation             Throat- Mallampati III-IV , mucosa clear , drainage- none, tonsils- atrophic Neck- flexible , trachea midline, no stridor , thyroid nl, carotid no bruit Chest - symmetrical excursion , unlabored           Heart/CV- RRR , no murmur , no gallop  , no rub, nl s1 s2                           -  JVD- none , edema- none, stasis changes- none, varices- none           Lung- clear to P&A, wheeze- none, cough- none , dullness-none, rub- none           Chest wall-  Abd-  Br/ Gen/ Rectal- Not done, not indicated Extrem- cyanosis- none, clubbing, none, atrophy- none, strength- nl Neuro- grossly intact to observation

## 2023-01-21 ENCOUNTER — Ambulatory Visit (INDEPENDENT_AMBULATORY_CARE_PROVIDER_SITE_OTHER): Payer: Commercial Managed Care - PPO | Admitting: Internal Medicine

## 2023-01-21 VITALS — BP 128/76 | HR 84 | Temp 98.0°F | Ht 71.0 in | Wt 379.0 lb

## 2023-01-21 DIAGNOSIS — E559 Vitamin D deficiency, unspecified: Secondary | ICD-10-CM | POA: Diagnosis not present

## 2023-01-21 DIAGNOSIS — R7303 Prediabetes: Secondary | ICD-10-CM

## 2023-01-21 DIAGNOSIS — Z0001 Encounter for general adult medical examination with abnormal findings: Secondary | ICD-10-CM

## 2023-01-21 DIAGNOSIS — E538 Deficiency of other specified B group vitamins: Secondary | ICD-10-CM | POA: Insufficient documentation

## 2023-01-21 DIAGNOSIS — I10 Essential (primary) hypertension: Secondary | ICD-10-CM | POA: Diagnosis not present

## 2023-01-21 DIAGNOSIS — Z1211 Encounter for screening for malignant neoplasm of colon: Secondary | ICD-10-CM

## 2023-01-21 DIAGNOSIS — E782 Mixed hyperlipidemia: Secondary | ICD-10-CM

## 2023-01-21 NOTE — Patient Instructions (Signed)
Ok to take the B12 1000 mcg per day for 6 months (OTC)  Please continue all other medications as before, and refills have been done if requested.  Please have the pharmacy call with any other refills you may need.  Please continue your efforts at being more active, low cholesterol diet, and weight control.  You are otherwise up to date with prevention measures today.  Please keep your appointments with your specialists as you may have planned  You will be contacted regarding the referral for: colonoscopy  Please make an Appointment to return in 6 months, or sooner if needed, also with Lab Appointment for testing done 3-5 days before at the FIRST FLOOR Lab (so this is for TWO appointments - please see the scheduling desk as you leave)

## 2023-01-21 NOTE — Progress Notes (Unsigned)
Patient ID: Kyle Levy, male   DOB: August 17, 1976, 47 y.o.   MRN: [id number]         Chief Complaint:: wellness exam and low b12, htn, hld, prediabetes, low vit d       HPI:  Kyle Levy is a 47 y.o. male here for wellness exam; declines covid booster, hep c screen, but ok for colonoscopy.  O/w up to date                         Also Overall lost 12 lbs from last visit. BP improved at home as well.  Pt denies chest pain, increased sob or doe, wheezing, orthopnea, PND, increased LE swelling, palpitations, dizziness or syncope.   Pt denies polydipsia, polyuria, or new focal neuro s/s.    Pt denies fever, wt loss, night sweats, loss of appetite, or other constitutional symptoms     Wt Readings from Last 3 Encounters:  01/21/23 (!) 379 lb (171.9 kg)  12/11/22 (!) 388 lb 12.8 oz (176.4 kg)  07/19/22 (!) 391 lb (177.4 kg)   BP Readings from Last 3 Encounters:  01/21/23 128/76  12/11/22 132/78  07/20/22 (!) 156/93   Immunization History  Administered Date(s) Administered   Influenza,inj,Quad PF,6+ Mos 05/28/2018, 06/04/2019, 07/12/2020, 07/14/2021, 07/19/2022   PFIZER(Purple Top)SARS-COV-2 Vaccination 10/11/2019, 10/31/2019, 08/02/2020   Tdap 10/06/2013, 10/30/2018  There are no preventive care reminders to display for this patient.    Past Medical History:  Diagnosis Date   Anxiety 08/27/2011   Back pain    HTN (hypertension) 08/27/2011   Hyperlipidemia 08/27/2011   Hypertension    Morbid obesity (HCC) 09/14/2015   OSA on CPAP    Psoriasis 09/14/2015   Past Surgical History:  Procedure Laterality Date   KNEE ARTHROSCOPY     bilateral knee    reports that he has never smoked. He has never used smokeless tobacco. He reports current alcohol use. He reports that he does not use drugs. family history includes Alcoholism in his father; Diabetes in his father; Eating disorder in his father; Heart disease in his father; Hyperlipidemia in his father; Hypertension in his father;  Obesity in his father; Sleep apnea in his father and mother; Stroke in his father. No Known Allergies Current Outpatient Medications on File Prior to Visit  Medication Sig Dispense Refill   amLODipine (NORVASC) 10 MG tablet Take 1 tablet (10 mg total) by mouth daily. 90 tablet 3   atorvastatin (LIPITOR) 20 MG tablet Take 1 tablet (20 mg total) by mouth daily. 90 tablet 3   azelastine (ASTELIN) 0.1 % nasal spray Place 1 spray into both nostrils 2 (two) times daily. Use in each nostril as directed 30 mL 0   azithromycin (ZITHROMAX) 250 MG tablet Take 2 tablets on day one then take 1 tablet daily for four additional days 6 tablet 0   beclomethasone (QVAR) 80 MCG/ACT inhaler Inhale 2 puffs into the lungs 2 (two) times daily. 1 each 6   benzonatate (TESSALON) 200 MG capsule Take 1 capsule (200 mg total) by mouth 3 (three) times daily as needed for cough. 30 capsule 1   busPIRone (BUSPAR) 10 MG tablet Take 1 tablet (10 mg total) by mouth 3 (three) times daily as needed. 270 tablet 3   Ergocalciferol (VITAMIN D2 PO) Take 5,000 Units by mouth.      escitalopram (LEXAPRO) 20 MG tablet Take 1 tablet (20 mg total) by mouth daily. 90 tablet 3  fluticasone (FLONASE) 50 MCG/ACT nasal spray Place 1 spray into both nostrils daily. 18.2 mL 2   gabapentin (NEURONTIN) 300 MG capsule Take 1-3 capsules (300-900 mg total) by mouth 3 (three) times daily as needed. 90 capsule 3   levocetirizine (XYZAL) 5 MG tablet Take 1 tablet (5 mg total) by mouth every evening. 90 tablet 3   losartan (COZAAR) 100 MG tablet Take 1 tablet (100 mg total) by mouth daily. 90 tablet 3   metoprolol succinate (TOPROL-XL) 50 MG 24 hr tablet Take 1 tablet (50 mg total) by mouth daily. Take with or immediately following a meal. 90 tablet 3   Multiple Vitamin (MULTIVITAMIN) tablet Take 1 tablet by mouth daily.     omeprazole (PRILOSEC) 40 MG capsule Take 1 capsule (40 mg total) by mouth daily. 30 capsule 1   phentermine 37.5 MG capsule Take 1  capsule (37.5 mg total) by mouth every morning. 90 capsule 1   promethazine-dextromethorphan (PROMETHAZINE-DM) 6.25-15 MG/5ML syrup Take 5 mLs by mouth 4 (four) times daily as needed for cough. 180 mL 0   No current facility-administered medications on file prior to visit.        ROS:  All others reviewed and negative.  Objective        PE:  BP 128/76 (BP Location: Left Arm, Patient Position: Sitting, Cuff Size: Normal)   Pulse 84   Temp 98 F (36.7 C) (Oral)   Ht 5\' 11"  (1.803 m)   Wt (!) 379 lb (171.9 kg)   SpO2 98%   BMI 52.86 kg/m                 Constitutional: Pt appears in NAD               HENT: Head: NCAT.                Right Ear: External ear normal.                 Left Ear: External ear normal.                Eyes: . Pupils are equal, round, and reactive to light. Conjunctivae and EOM are normal               Nose: without d/c or deformity               Neck: Neck supple. Gross normal ROM               Cardiovascular: Normal rate and regular rhythm.                 Pulmonary/Chest: Effort normal and breath sounds without rales or wheezing.                Abd:  Soft, NT, ND, + BS, no organomegaly               Neurological: Pt is alert. At baseline orientation, motor grossly intact               Skin: Skin is warm. No rashes, no other new lesions, LE edema - none               Psychiatric: Pt behavior is normal without agitation   Micro: none  Cardiac tracings I have personally interpreted today:  none  Pertinent Radiological findings (summarize): none   Lab Results  Component Value Date   WBC 7.4 01/14/2023   HGB 15.3 01/14/2023   HCT  45.8 01/14/2023   PLT 352.0 01/14/2023   GLUCOSE 98 01/14/2023   CHOL 137 01/14/2023   TRIG 143.0 01/14/2023   HDL 34.50 (L) 01/14/2023   LDLDIRECT 93.0 01/16/2022   LDLCALC 73 01/14/2023   ALT 28 01/14/2023   AST 22 01/14/2023   NA 137 01/14/2023   K 4.2 01/14/2023   CL 100 01/14/2023   CREATININE 0.90 01/14/2023    BUN 12 01/14/2023   CO2 28 01/14/2023   TSH 1.58 01/14/2023   PSA 0.74 01/14/2023   HGBA1C 5.9 01/14/2023   MICROALBUR 2.5 (H) 01/14/2023   Assessment/Plan:  Kyle Levy is a 47 y.o. White or Caucasian [1] male with  has a past medical history of Anxiety (08/27/2011), Back pain, HTN (hypertension) (08/27/2011), Hyperlipidemia (08/27/2011), Hypertension, Morbid obesity (HCC) (09/14/2015), OSA on CPAP, and Psoriasis (09/14/2015).  Encounter for well adult exam with abnormal findings Age and sex appropriate education and counseling updated with regular exercise and diet Referrals for preventative services - for colonoscopy, declines hep c screen Immunizations addressed - declines covid booster Smoking counseling  - none needed Evidence for depression or other mood disorder - none significant Most recent labs reviewed. I have personally reviewed and have noted: 1) the patient's medical and social history 2) The patient's current medications and supplements 3) The patient's height, weight, and BMI have been recorded in the chart   B12 deficiency Lab Results  Component Value Date   VITAMINB12 285 01/14/2023   Low, to start oral replacement - b12 1000 mcg qd   Essential hypertension BP Readings from Last 3 Encounters:  01/21/23 128/76  12/11/22 132/78  07/20/22 (!) 156/93   Stable, pt to continue medical treatment norvasc 10 qd, losartan 100 qd, toprol xl 50 qd   Mixed hyperlipidemia Lab Results  Component Value Date   LDLCALC 73 01/14/2023   Stable, pt to continue current statin lipitor 20 mg qd   Prediabetes Lab Results  Component Value Date   HGBA1C 5.9 01/14/2023   Stable, pt to continue current medical treatment  - diet, wt control   Vitamin D deficiency Last vitamin D Lab Results  Component Value Date   VD25OH 43.10 01/14/2023   Stable, cont oral replacement  Followup: Return in about 6 months (around 07/23/2023).  Oliver Barre, MD 01/24/2023 12:32  PM [facility]

## 2023-01-24 ENCOUNTER — Encounter: Payer: Self-pay | Admitting: Internal Medicine

## 2023-01-24 ENCOUNTER — Ambulatory Visit: Payer: Commercial Managed Care - PPO | Admitting: Internal Medicine

## 2023-01-24 NOTE — Assessment & Plan Note (Signed)
Lab Results  Component Value Date   HGBA1C 5.9 01/14/2023   Stable, pt to continue current medical treatment  - diet, wt control

## 2023-01-24 NOTE — Assessment & Plan Note (Signed)
Lab Results  Component Value Date   LDLCALC 73 01/14/2023   Stable, pt to continue current statin lipitor 20 mg qd

## 2023-01-24 NOTE — Assessment & Plan Note (Signed)
Last vitamin D Lab Results  Component Value Date   VD25OH 43.10 01/14/2023   Stable, cont oral replacement

## 2023-01-24 NOTE — Assessment & Plan Note (Signed)
Age and sex appropriate education and counseling updated with regular exercise and diet Referrals for preventative services - for colonoscopy, declines hep c screen Immunizations addressed - declines covid booster Smoking counseling  - none needed Evidence for depression or other mood disorder - none significant Most recent labs reviewed. I have personally reviewed and have noted: 1) the patient's medical and social history 2) The patient's current medications and supplements 3) The patient's height, weight, and BMI have been recorded in the chart

## 2023-01-24 NOTE — Assessment & Plan Note (Signed)
Lab Results  Component Value Date   VITAMINB12 285 01/14/2023   Low, to start oral replacement - b12 1000 mcg qd

## 2023-01-24 NOTE — Assessment & Plan Note (Signed)
BP Readings from Last 3 Encounters:  01/21/23 128/76  12/11/22 132/78  07/20/22 (!) 156/93   Stable, pt to continue medical treatment norvasc 10 qd, losartan 100 qd, toprol xl 50 qd

## 2023-01-25 NOTE — Progress Notes (Unsigned)
HPI male never smoker followed for OSA, complicated by morbid obesity Unattended Home Sleep Test 04/01/17-AHI 12.8/hour, desaturation to 80%, body weight 374 pounds  ----------------------------------------------------------------------   10/09/2018- 47 year old male never smoker followed for OSA, complicated by morbid obesity ---3 month follow up, acute symptoms -----OSA; DME: [name] wears CPAP nightly and DL attached.  No new supplies needed.  CPAP auto 5-15/Advanced Weight today 380 pounds Download 87% compliance AHI 0.5/hour He feels he is doing well. Comfortable withj current settings.   01/28/23- 47 year old male never smoker followed for OSA, complicated by morbid obesity, Allergic Rhinitis, Asthmatic Bronchitis, GERD,  CPAP auto 5-15/Adapt AirSense 10       replaced 12/11/22- Weight today  Download compliance- 100%, AHI 0.6/ hr [name], NP 12/11/22-CPAP f/u and cough. Ordered replacement for old CPAP. Asthmatic bronchitis, GERD ------Pt received new cpap machine states its working great Download reviewed. No concerns expressed. Life is better with CPAP. He is frustrated by his weight but still trying to lose. Bronchitic cough for which he was seen in March has now resolved. CXR 12/11/22-  FINDINGS: Cardiac size is within normal limits. There are no signs of pulmonary edema or focal pulmonary consolidation. There is no pleural effusion or pneumothorax. IMPRESSION: There are no signs of pulmonary edema or focal pulmonary consolidation.   ROS-see HPI   + = positive Constitutional:    weight loss, night sweats, fevers, chills, fatigue, lassitude. HEENT:    headaches, difficulty swallowing, tooth/dental problems, sore throat,       sneezing, itching, ear ache, + nasal congestion, post nasal drip, snoring CV:    chest pain, orthopnea, PND, swelling in lower extremities, anasarca,                                                     dizziness, palpitations Resp:   shortness of breath  with exertion or at rest.                productive cough,   non-productive cough, coughing up of blood.              change in color of mucus.  wheezing.   Skin:    rash or lesions. GI:  No-   heartburn, indigestion, abdominal pain, nausea, vomiting, diarrhea,                 change in bowel habits, loss of appetite GU: dysuria, change in color of urine, no urgency or frequency.   flank pain. MS:   joint pain, stiffness, decreased range of motion, back pain. Neuro-     nothing unusual Psych:  change in mood or affect.  depression or anxiety.   memory loss.  OBJ- Physical Exam General- Alert, Oriented, Affect-appropriate, Distress- none acute + morbidly obese/big man Skin- rash-none, lesions- none, excoriation- none Lymphadenopathy- none Head- atraumatic            Eyes- Gross vision intact, PERRLA, conjunctivae and secretions clear            Ears- Hearing, canals-normal            Nose-+ mild nasal congestion, no-Septal dev, mucus, polyps, erosion, perforation             Throat- Mallampati III-IV , mucosa clear , drainage- none, tonsils- atrophic Neck- flexible , trachea midline, no stridor ,  thyroid nl, carotid no bruit Chest - symmetrical excursion , unlabored           Heart/CV- RRR , no murmur , no gallop  , no rub, nl s1 s2                           - JVD- none , edema- none, stasis changes- none, varices- none           Lung- clear to P&A, wheeze- none, cough- none , dullness-none, rub- none           Chest wall-  Abd-  Br/ Gen/ Rectal- Not done, not indicated Extrem- cyanosis- none, clubbing, none, atrophy- none, strength- nl Neuro- grossly intact to observation

## 2023-01-28 ENCOUNTER — Ambulatory Visit: Payer: Commercial Managed Care - PPO | Admitting: Internal Medicine

## 2023-01-28 ENCOUNTER — Encounter: Payer: Self-pay | Admitting: Internal Medicine

## 2023-01-28 VITALS — BP 130/78 | HR 75 | Ht 71.0 in | Wt 383.6 lb

## 2023-01-28 DIAGNOSIS — G4733 Obstructive sleep apnea (adult) (pediatric): Secondary | ICD-10-CM | POA: Diagnosis not present

## 2023-01-28 DIAGNOSIS — E65 Localized adiposity: Secondary | ICD-10-CM | POA: Diagnosis not present

## 2023-01-28 NOTE — Assessment & Plan Note (Signed)
Benefits from CPAP with good compliance and control Plan- continue auto 5-15 

## 2023-01-28 NOTE — Assessment & Plan Note (Signed)
He is very aware of importance. Working with his PCP.

## 2023-01-28 NOTE — Patient Instructions (Signed)
We can continue CPAP auto 5-15 ? ?Please cal if we can help ?

## 2023-01-29 ENCOUNTER — Encounter: Payer: Self-pay | Admitting: Gastroenterology

## 2023-02-07 ENCOUNTER — Other Ambulatory Visit: Payer: Self-pay | Admitting: Internal Medicine

## 2023-02-26 ENCOUNTER — Other Ambulatory Visit: Payer: Self-pay | Admitting: Gastroenterology

## 2023-02-26 ENCOUNTER — Telehealth: Payer: Self-pay

## 2023-02-26 DIAGNOSIS — Z1211 Encounter for screening for malignant neoplasm of colon: Secondary | ICD-10-CM

## 2023-02-26 NOTE — Telephone Encounter (Signed)
Pt made aware his PV and colonoscopy will be cancelled due to BMI. He understands someone will reach out to him to let him know if he needs OV once Dr. Russella Dar returns to the office.

## 2023-02-26 NOTE — Telephone Encounter (Signed)
Patty, Please see attached

## 2023-02-26 NOTE — Telephone Encounter (Signed)
Meryl Dare, MD  Darylene Price, RN5 hours ago (10:05 AM)   Recent visits with his PCP and Pulmonary physician reviewed. OK for direct colonoscopy at the hospital.

## 2023-02-26 NOTE — Telephone Encounter (Signed)
Dr. Russella Dar this patient is scheduled for a direct colon with you. BMI > 50. Please advise if he will need an OV or is he ok for direct to the hospital.   Thank you, Previsit

## 2023-02-26 NOTE — Telephone Encounter (Signed)
Patient has been scheduled at [facility] for colonoscopy on 04/25/23 at 9:45 am. He has also been scheduled for a telephone previsit on 04/01/23 at 4:00 pm to go over past medical history, medications and prep instructions.   I have left a voicemail for patient to call back to discuss.

## 2023-02-27 NOTE — Telephone Encounter (Signed)
The pt has been advised of the change in appts. He will await phone call on 7/8 with pre visit.

## 2023-03-26 ENCOUNTER — Telehealth: Payer: Self-pay | Admitting: *Deleted

## 2023-03-26 NOTE — Telephone Encounter (Signed)
Kyle Levy,  This pt's BMI is greater than 50; their procedure will need to be performed at the hospital.  Thanks,  Kyle Levy 

## 2023-04-01 ENCOUNTER — Ambulatory Visit: Payer: Commercial Managed Care - PPO

## 2023-04-01 VITALS — Ht 71.0 in | Wt 377.0 lb

## 2023-04-01 DIAGNOSIS — Z1211 Encounter for screening for malignant neoplasm of colon: Secondary | ICD-10-CM

## 2023-04-01 MED ORDER — NA SULFATE-K SULFATE-MG SULF 17.5-3.13-1.6 GM/177ML PO SOLN: 1.0000 | Freq: Once | ORAL | 0 refills | Status: DC

## 2023-04-01 MED ORDER — NA SULFATE-K SULFATE-MG SULF 17.5-3.13-1.6 GM/177ML PO SOLN: 1.0000 | Freq: Once | ORAL | 0 refills | Status: AC

## 2023-04-01 NOTE — Progress Notes (Unsigned)
No egg or soy allergy known to patient  No issues known to pt with past sedation with any surgeries or procedures Patient denies ever being told they had issues or difficulty with intubation  No FH of Malignant Hyperthermia Pt is not on diet pills Pt is not on  home 02  Pt is not on blood thinners  Pt denies issues with constipation  No A fib or A flutter Have any cardiac testing pending--no Patient's chart reviewed by [name] CNRA prior to previsit and patient appropriate for the [facility].  Previsit completed and red dot placed by patient's name on their procedure day (on provider's schedule).    Ambulates independently Pt instructed to use [email or web address] or GoodRx for a price reduction on prep   

## 2023-04-02 ENCOUNTER — Encounter: Payer: Commercial Managed Care - PPO | Admitting: Gastroenterology

## 2023-04-10 ENCOUNTER — Encounter: Payer: Self-pay | Admitting: Gastroenterology

## 2023-04-16 ENCOUNTER — Encounter (HOSPITAL_COMMUNITY): Payer: Self-pay | Admitting: Gastroenterology

## 2023-04-24 NOTE — Anesthesia Preprocedure Evaluation (Signed)
Anesthesia Evaluation  Patient identified by MRN, date of birth, ID band Patient awake    Reviewed: Allergy & Precautions, NPO status , Patient's Chart, lab work & pertinent test results, reviewed documented beta blocker date and time   Airway Mallampati: II  TM Distance: >3 FB Neck ROM: Full    Dental  (+) Teeth Intact, Dental Advisory Given   Pulmonary asthma , sleep apnea and Continuous Positive Airway Pressure Ventilation    Pulmonary exam normal breath sounds clear to auscultation       Cardiovascular hypertension, Pt. on medications and Pt. on home beta blockers Normal cardiovascular exam Rhythm:Regular Rate:Normal     Neuro/Psych  PSYCHIATRIC DISORDERS Anxiety     negative neurological ROS     GI/Hepatic Neg liver ROS,GERD  Medicated,,  Endo/Other    Morbid obesity (BMI 53)  Renal/GU negative Renal ROS     Musculoskeletal negative musculoskeletal ROS (+)    Abdominal   Peds  Hematology negative hematology ROS (+)   Anesthesia Other Findings   Reproductive/Obstetrics                             Anesthesia Physical Anesthesia Plan  ASA: 4  Anesthesia Plan: MAC   Post-op Pain Management:    Induction: Intravenous  PONV Risk Score and Plan: 1 and TIVA and Treatment may vary due to age or medical condition  Airway Management Planned: Natural Airway and Simple Face Mask  Additional Equipment:   Intra-op Plan:   Post-operative Plan:   Informed Consent: I have reviewed the patients History and Physical, chart, labs and discussed the procedure including the risks, benefits and alternatives for the proposed anesthesia with the patient or authorized representative who has indicated his/her understanding and acceptance.     Dental advisory given  Plan Discussed with: CRNA and Anesthesiologist  Anesthesia Plan Comments:        Anesthesia Quick Evaluation

## 2023-04-25 ENCOUNTER — Ambulatory Visit (HOSPITAL_BASED_OUTPATIENT_CLINIC_OR_DEPARTMENT_OTHER): Payer: Commercial Managed Care - PPO | Admitting: Anesthesiology

## 2023-04-25 ENCOUNTER — Ambulatory Visit (HOSPITAL_COMMUNITY)
Admission: RE | Admit: 2023-04-25 | Discharge: 2023-04-25 | Disposition: A | Discharge status: Home / Self Care | Payer: Commercial Managed Care - PPO | Attending: Gastroenterology | Admitting: Gastroenterology

## 2023-04-25 ENCOUNTER — Other Ambulatory Visit: Payer: Self-pay

## 2023-04-25 ENCOUNTER — Ambulatory Visit (HOSPITAL_COMMUNITY): Payer: Commercial Managed Care - PPO | Admitting: Anesthesiology

## 2023-04-25 ENCOUNTER — Encounter (HOSPITAL_COMMUNITY): Payer: Self-pay | Admitting: Gastroenterology

## 2023-04-25 ENCOUNTER — Encounter (HOSPITAL_COMMUNITY)
Admission: RE | Disposition: A | Discharge status: Home / Self Care | Payer: Self-pay | Source: Home / Self Care | Attending: Gastroenterology

## 2023-04-25 DIAGNOSIS — Z1211 Encounter for screening for malignant neoplasm of colon: Secondary | ICD-10-CM | POA: Diagnosis not present

## 2023-04-25 DIAGNOSIS — D126 Benign neoplasm of colon, unspecified: Secondary | ICD-10-CM | POA: Diagnosis not present

## 2023-04-25 DIAGNOSIS — I10 Essential (primary) hypertension: Secondary | ICD-10-CM

## 2023-04-25 DIAGNOSIS — Z79899 Other long term (current) drug therapy: Secondary | ICD-10-CM | POA: Diagnosis not present

## 2023-04-25 DIAGNOSIS — K219 Gastro-esophageal reflux disease without esophagitis: Secondary | ICD-10-CM | POA: Insufficient documentation

## 2023-04-25 DIAGNOSIS — K635 Polyp of colon: Secondary | ICD-10-CM | POA: Insufficient documentation

## 2023-04-25 DIAGNOSIS — Z6841 Body Mass Index (BMI) 40.0 and over, adult: Secondary | ICD-10-CM | POA: Insufficient documentation

## 2023-04-25 DIAGNOSIS — D122 Benign neoplasm of ascending colon: Secondary | ICD-10-CM | POA: Diagnosis not present

## 2023-04-25 DIAGNOSIS — K5730 Diverticulosis of large intestine without perforation or abscess without bleeding: Secondary | ICD-10-CM

## 2023-04-25 DIAGNOSIS — K648 Other hemorrhoids: Secondary | ICD-10-CM | POA: Diagnosis not present

## 2023-04-25 DIAGNOSIS — D124 Benign neoplasm of descending colon: Secondary | ICD-10-CM

## 2023-04-25 DIAGNOSIS — J45909 Unspecified asthma, uncomplicated: Secondary | ICD-10-CM | POA: Insufficient documentation

## 2023-04-25 DIAGNOSIS — G4733 Obstructive sleep apnea (adult) (pediatric): Secondary | ICD-10-CM | POA: Diagnosis not present

## 2023-04-25 DIAGNOSIS — F419 Anxiety disorder, unspecified: Secondary | ICD-10-CM | POA: Insufficient documentation

## 2023-04-25 DIAGNOSIS — K640 First degree hemorrhoids: Secondary | ICD-10-CM | POA: Diagnosis not present

## 2023-04-25 HISTORY — PX: POLYPECTOMY: SHX5525

## 2023-04-25 HISTORY — PX: COLONOSCOPY WITH PROPOFOL: SHX5780

## 2023-04-25 SURGERY — COLONOSCOPY WITH PROPOFOL
Anesthesia: Monitor Anesthesia Care

## 2023-04-25 MED ORDER — SODIUM CHLORIDE 0.9 % IV SOLN: INTRAVENOUS | Status: DC

## 2023-04-25 MED ORDER — PROPOFOL 10 MG/ML IV BOLUS
INTRAVENOUS | Status: DC | PRN
  Administered 2023-04-25: 60 mg via INTRAVENOUS
  Administered 2023-04-25: 120 ug/kg/min via INTRAVENOUS

## 2023-04-25 MED ORDER — LIDOCAINE 2% (20 MG/ML) 5 ML SYRINGE
INTRAMUSCULAR | Status: DC | PRN
  Administered 2023-04-25: 100 mg via INTRAVENOUS

## 2023-04-25 MED ORDER — LACTATED RINGERS IV SOLN: INTRAVENOUS | Status: DC

## 2023-04-25 SURGICAL SUPPLY — 22 items

## 2023-04-25 NOTE — Transfer of Care (Signed)
Immediate Anesthesia Transfer of Care Note  Patient: Kyle Levy  Procedure(s) Performed: COLONOSCOPY WITH PROPOFOL POLYPECTOMY  Patient Location: PACU and Endoscopy Unit  Anesthesia Type:MAC  Level of Consciousness: awake and alert   Airway & Oxygen Therapy: Patient Spontanous Breathing  Post-op Assessment: Report given to RN  Post vital signs: stable  Last Vitals:  Vitals Value Taken Time  BP    Temp 36.6 C 04/25/23 1017  Pulse 66 04/25/23 1017  Resp 20 04/25/23 1017  SpO2 96 % 04/25/23 1017  Vitals shown include unfiled device data.  Last Pain:  Vitals:   04/25/23 1017  TempSrc: Tympanic  PainSc: 0-No pain         Complications: No notable events documented.

## 2023-04-25 NOTE — Op Note (Signed)
[facility] Patient Name: Kyle Levy Procedure Date: 04/25/2023 MRN: [id number] Attending MD: Meryl Dare , MD, [phone] Date of Birth: [birth year] CSN: [id number] Age: 47 Admit Type: Outpatient Procedure:                Colonoscopy Indications:              Screening for colorectal malignant neoplasm Providers:                Venita Lick. Russella Dar, MD, Martha Clan, RN,                            Salley Scarlet, Technician, Salli Real, CRNA Referring MD:             Corwin Levins, MD Medicines:                Monitored Anesthesia Care Complications:            No immediate complications. Estimated blood loss:                            None. Estimated Blood Loss:     Estimated blood loss: none. Procedure:                Pre-Anesthesia Assessment:                           - Prior to the procedure, a History and Physical                            was performed, and patient medications and                            allergies were reviewed. The patient's tolerance of                            previous anesthesia was also reviewed. The risks                            and benefits of the procedure and the sedation                            options and risks were discussed with the patient.                            All questions were answered, and informed consent                            was obtained. Prior Anticoagulants: The patient has                            taken no anticoagulant or antiplatelet agents. ASA                            Grade Assessment: III - A patient with severe  systemic disease. After reviewing the risks and                            benefits, the patient was deemed in satisfactory                            condition to undergo the procedure.                           After obtaining informed consent, the colonoscope                            was passed under direct vision. Throughout the                             procedure, the patient's blood pressure, pulse, and                            oxygen saturations were monitored continuously. The                            CF-HQ190L ([id number]) Olympus colonoscope was                            introduced through the anus and advanced to the the                            cecum, identified by appendiceal orifice and                            ileocecal valve. The ileocecal valve, appendiceal                            orifice, and rectum were photographed. The quality                            of the bowel preparation was excellent. The                            colonoscopy was performed without difficulty. The                            patient tolerated the procedure well. Scope In: 9:50:42 AM Scope Out: 10:09:53 AM Scope Withdrawal Time: 0 hours 14 minutes 15 seconds  Total Procedure Duration: 0 hours 19 minutes 11 seconds  Findings:      The perianal and digital rectal examinations were normal.      Two sessile polyps were found in the descending colon and ascending       colon. The polyps were 8 mm in size. These polyps were removed with a       cold snare. Resection and retrieval were complete.      A few small-mouthed diverticula were found in the left colon.      Internal hemorrhoids were found during retroflexion. The hemorrhoids       were  small and Grade I (internal hemorrhoids that do not prolapse).      The exam was otherwise without abnormality on direct and retroflexion       views. Impression:               - Two 8 mm polyps in the descending colon and in                            the ascending colon, removed with a cold snare.                            Resected and retrieved.                           - Mild diverticulosis in the left colon.                           - Internal hemorrhoids.                           - The examination was otherwise normal on direct                            and retroflexion  views. Moderate Sedation:      Not Applicable - Patient had care per Anesthesia. Recommendation:           - Repeat colonoscopy after studies are complete for                            surveillance based on pathology results.                           - Patient has a contact number available for                            emergencies. The signs and symptoms of potential                            delayed complications were discussed with the                            patient. Return to normal activities tomorrow.                            Written discharge instructions were provided to the                            patient.                           - High fiber diet.                           - Continue present medications.                           - Await  pathology results. Procedure Code(s):        --- Professional ---                           [phone], Colonoscopy, flexible; with removal of                            tumor(s), polyp(s), or other lesion(s) by snare                            technique Diagnosis Code(s):        --- Professional ---                           Z12.11, Encounter for screening for malignant                            neoplasm of colon                           D12.4, Benign neoplasm of descending colon                           D12.2, Benign neoplasm of ascending colon                           K64.0, First degree hemorrhoids                           K57.30, Diverticulosis of large intestine without                            perforation or abscess without bleeding CPT copyright 2022 American Medical Association. All rights reserved. The codes documented in this report are preliminary and upon coder review may  be revised to meet current compliance requirements. Meryl Dare, MD 04/25/2023 10:14:41 AM This report has been signed electronically. Number of Addenda: 0

## 2023-04-25 NOTE — H&P (Signed)
History & Physical  Primary Care Physician:  Corwin Levins, MD Primary Gastroenterologist: Claudette Head, MD  Impression / Plan:  Average risk CRC screening for colonoscopy.  CHIEF COMPLAINT:  CRC screening  HPI: Kyle Levy is a 47 y.o. male average risk CRC screening for colonoscopy.  BMI= 53.42.    Past Medical History:  Diagnosis Date   Anxiety 08/27/2011   Back pain    HTN (hypertension) 08/27/2011   Hyperlipidemia 08/27/2011   Hypertension    Morbid obesity (HCC) 09/14/2015   OSA on CPAP    Psoriasis 09/14/2015   Sleep apnea     Past Surgical History:  Procedure Laterality Date   KNEE ARTHROSCOPY     bilateral knee    Prior to Admission medications   Medication Sig Start Date End Date Taking? Authorizing Provider  amLODipine (NORVASC) 10 MG tablet Take 1 tablet (10 mg total) by mouth daily. 09/10/22  Yes Corwin Levins, MD  atorvastatin (LIPITOR) 20 MG tablet Take 1 tablet (20 mg total) by mouth daily. 09/10/22  Yes Corwin Levins, MD  azelastine (ASTELIN) 0.1 % nasal spray Place 1 spray into both nostrils 2 (two) times daily. Use in each nostril as directed 10/10/22  Yes Margaretann Loveless, PA-C  beclomethasone (QVAR) 80 MCG/ACT inhaler Inhale 2 puffs into the lungs 2 (two) times daily. 12/11/22  Yes Cobb, Ruby Cola, NP  busPIRone (BUSPAR) 10 MG tablet Take 1 tablet (10 mg total) by mouth 3 (three) times daily as needed. 07/14/21  Yes Corwin Levins, MD  Ergocalciferol (VITAMIN D2 PO) Take 5,000 Units by mouth.    Yes [provider]  escitalopram (LEXAPRO) 20 MG tablet Take 1 tablet (20 mg total) by mouth daily. 07/14/21  Yes Corwin Levins, MD  levocetirizine (XYZAL) 5 MG tablet Take 1 tablet (5 mg total) by mouth every evening. 08/15/21  Yes Corwin Levins, MD  losartan (COZAAR) 100 MG tablet Take 1 tablet (100 mg total) by mouth daily. 09/10/22  Yes Corwin Levins, MD  omeprazole (PRILOSEC) 40 MG capsule Take 1 capsule (40 mg total) by mouth daily.  12/11/22  Yes Cobb, Ruby Cola, NP  vitamin B-12 (CYANOCOBALAMIN) 100 MCG tablet Take 100 mcg by mouth daily.   Yes [provider]  fluticasone (FLONASE) 50 MCG/ACT nasal spray Place 1 spray into both nostrils daily. Patient not taking: Reported on 04/01/2023 12/11/22   Noemi Chapel, NP  gabapentin (NEURONTIN) 300 MG capsule Take 1-3 capsules (300-900 mg total) by mouth 3 (three) times daily as needed. 07/12/22   Rodolph Bong, MD  metoprolol succinate (TOPROL-XL) 50 MG 24 hr tablet TAKE 1 TABLET BY MOUTH EVERY DAY TAKE WITH OR IMMEDIATELY FOLLOWING A MEAL Patient not taking: Reported on 04/01/2023 02/01/23   Corwin Levins, MD  Multiple Vitamin (MULTIVITAMIN) tablet Take 1 tablet by mouth daily. Patient not taking: Reported on 04/01/2023    [provider]  phentermine (ADIPEX-P) 37.5 MG tablet TAKE 1 TABLET BY MOUTH EVERY MORNING Patient not taking: Reported on 04/01/2023 02/08/23   Corwin Levins, MD    Current Facility-Administered Medications  Medication Dose Route Frequency Provider Last Rate Last Admin   0.9 %  sodium chloride infusion   Intravenous Continuous Meryl Dare, MD       lactated ringers infusion   Intravenous Continuous Meryl Dare, MD 10 mL/hr at 04/25/23 0833 New Bag at 04/25/23 4098    Allergies as of 02/26/2023   (  No Known Allergies)    Family History  Problem Relation Age of Onset   Sleep apnea Mother    Heart disease Father    Hypertension Father    Stroke Father    Diabetes Father    Hyperlipidemia Father    Sleep apnea Father    Alcoholism Father    Eating disorder Father    Obesity Father    Colon cancer Neg Hx    Colon polyps Neg Hx    Esophageal cancer Neg Hx    Rectal cancer Neg Hx    Stomach cancer Neg Hx     Social History   Socioeconomic History   Marital status: Married    Spouse name: Not on file   Number of children: Not on file   Years of education: 16   Highest education level: Bachelor's degree (e.g., BA,  AB, BS)  Occupational History   Occupation: Surveyor, quantity  Tobacco Use   Smoking status: Never   Smokeless tobacco: Never  Vaping Use   Vaping status: Never Used  Substance and Sexual Activity   Alcohol use: Yes    Comment: social   Drug use: Yes    Frequency: 1.0 times per week    Types: Marijuana   Sexual activity: Not on file  Other Topics Concern   Not on file  Social History Narrative   Not on file   Social Determinants of Health   Financial Resource Strain: Low Risk  (01/21/2023)   Overall Financial Resource Strain (CARDIA)    Difficulty of Paying Living Expenses: Not hard at all  Food Insecurity: No Food Insecurity (01/21/2023)   Hunger Vital Sign    Worried About Running Out of Food in the Last Year: Never true    Ran Out of Food in the Last Year: Never true  Transportation Needs: No Transportation Needs (01/21/2023)   PRAPARE - Administrator, Civil Service (Medical): No    Lack of Transportation (Non-Medical): No  Physical Activity: Sufficiently Active (01/21/2023)   Exercise Vital Sign    Days of Exercise per Week: 4 days    Minutes of Exercise per Session: 40 min  Stress: No Stress Concern Present (01/21/2023)   [employer] of Occupational Health - Occupational Stress Questionnaire    Feeling of Stress : Not at all  Social Connections: Socially Integrated (01/21/2023)   Social Connection and Isolation Panel [NHANES]    Frequency of Communication with Friends and Family: More than three times a week    Frequency of Social Gatherings with Friends and Family: More than three times a week    Attends Religious Services: More than 4 times per year    Active Member of [employer] or Organizations: Yes    Attends Engineer, structural: More than 4 times per year    Marital Status: Married  Catering manager Violence: Not on file    Review of Systems:  All systems reviewed were negative except where noted in HPI.   Physical Exam: Vital  signs in last 24 hours: Temp:  [98.6 F (37 C)] 98.6 F (37 C) (08/01 0829) Pulse Rate:  [80] 80 (08/01 0829) Resp:  [17] 17 (08/01 0829) BP: (179)/(108) 179/108 (08/01 0829) SpO2:  [96 %] 96 % (08/01 0829) Weight:  [173.7 kg] 173.7 kg (08/01 0829)   General:  Alert, well-developed, in NAD Head:  Normocephalic and atraumatic. Eyes:  Sclera clear, no icterus.   Conjunctiva pink. Ears:  Normal auditory acuity. Mouth:  No deformity or lesions.  Neck:  Supple; no masses. Lungs:  Clear throughout to auscultation.   No wheezes, crackles, or rhonchi.  Heart:  Regular rate and rhythm; no murmurs. Abdomen:  Soft, nondistended, nontender. No masses, hepatomegaly. No palpable masses.  Normal bowel sounds.    Rectal:  Deferred   Msk:  Symmetrical without gross deformities. Extremities:  Without edema. Neurologic:  Alert and  oriented x 4; grossly normal neurologically. Skin:  Intact without significant lesions or rashes. Psych:  Alert and cooperative. Normal mood and affect.   Venita Lick. Russella Dar  04/25/2023, 9:22 AM See Loretha Stapler, [facility], to contact our on call provider

## 2023-04-25 NOTE — Discharge Instructions (Signed)

## 2023-04-27 ENCOUNTER — Encounter (HOSPITAL_COMMUNITY): Payer: Self-pay | Admitting: Gastroenterology

## 2023-04-28 NOTE — Anesthesia Postprocedure Evaluation (Signed)
Anesthesia Post Note  Patient: Kyle Levy  Procedure(s) Performed: COLONOSCOPY WITH PROPOFOL POLYPECTOMY     Patient location during evaluation: Endoscopy Anesthesia Type: MAC Level of consciousness: oriented, awake and alert and awake Pain management: pain level controlled Vital Signs Assessment: post-procedure vital signs reviewed and stable Respiratory status: spontaneous breathing, nonlabored ventilation, respiratory function stable and patient connected to nasal cannula oxygen Cardiovascular status: blood pressure returned to baseline and stable Postop Assessment: no headache, no backache and no apparent nausea or vomiting Anesthetic complications: no   No notable events documented.  Last Vitals:  Vitals:   04/25/23 1030 04/25/23 1036  BP: (!) 172/96 (!) 167/101  Pulse: 68 71  Resp: 19 (!) 21  Temp:    SpO2: 95% 94%    Last Pain:  Vitals:   04/25/23 1036  TempSrc:   PainSc: 0-No pain                 Collene Schlichter

## 2023-05-03 ENCOUNTER — Encounter: Payer: Self-pay | Admitting: Gastroenterology

## 2023-05-05 ENCOUNTER — Encounter: Payer: Self-pay | Admitting: Internal Medicine

## 2023-07-17 ENCOUNTER — Other Ambulatory Visit (INDEPENDENT_AMBULATORY_CARE_PROVIDER_SITE_OTHER): Payer: Commercial Managed Care - PPO

## 2023-07-17 DIAGNOSIS — E559 Vitamin D deficiency, unspecified: Secondary | ICD-10-CM

## 2023-07-17 DIAGNOSIS — R7303 Prediabetes: Secondary | ICD-10-CM | POA: Diagnosis not present

## 2023-07-17 LAB — BASIC METABOLIC PANEL
BUN: 14 mg/dL (ref 6–23)
CO2: 26 meq/L (ref 19–32)
Calcium: 9.4 mg/dL (ref 8.4–10.5)
Chloride: 100 meq/L (ref 96–112)
Creatinine, Ser: 0.87 mg/dL (ref 0.40–1.50)
GFR: 103.77 mL/min (ref >60.00)
Glucose, Bld: 92 mg/dL (ref 70–99)
Potassium: 3.6 meq/L (ref 3.5–5.1)
Sodium: 135 meq/L (ref 135–145)

## 2023-07-17 LAB — HEMOGLOBIN A1C: Hgb A1c MFr Bld: 5.9 % (ref 4.6–6.5)

## 2023-07-17 LAB — LIPID PANEL
Cholesterol: 162 mg/dL (ref 0–200)
HDL: 37.1 mg/dL — ABNORMAL LOW (ref >39.00)
LDL Cholesterol: 88 mg/dL (ref 0–99)
NonHDL: 124.97
Total CHOL/HDL Ratio: 4
Triglycerides: 185 mg/dL — ABNORMAL HIGH (ref 0.0–149.0)
VLDL: 37 mg/dL (ref 0.0–40.0)

## 2023-07-17 LAB — HEPATIC FUNCTION PANEL
ALT: 30 U/L (ref 0–53)
AST: 28 U/L (ref 0–37)
Albumin: 4.5 g/dL (ref 3.5–5.2)
Alkaline Phosphatase: 89 U/L (ref 39–117)
Bilirubin, Direct: 0.1 mg/dL (ref 0.0–0.3)
Total Bilirubin: 0.8 mg/dL (ref 0.2–1.2)
Total Protein: 8.2 g/dL (ref 6.0–8.3)

## 2023-07-17 LAB — VITAMIN D 25 HYDROXY (VIT D DEFICIENCY, FRACTURES): VITD: 44.71 ng/mL (ref 30.00–100.00)

## 2023-07-17 NOTE — Progress Notes (Signed)
The test results show that your current treatment is OK, as the tests are stable.  Please continue the same plan.  There is no other need for change of treatment or further evaluation based on these results, at this time.  thanks 

## 2023-07-24 ENCOUNTER — Encounter: Payer: Commercial Managed Care - PPO | Admitting: Internal Medicine

## 2023-08-08 ENCOUNTER — Other Ambulatory Visit: Payer: Self-pay | Admitting: Internal Medicine

## 2023-09-05 ENCOUNTER — Other Ambulatory Visit: Payer: Self-pay | Admitting: Internal Medicine

## 2023-09-05 ENCOUNTER — Other Ambulatory Visit: Payer: Self-pay

## 2023-10-07 ENCOUNTER — Other Ambulatory Visit (HOSPITAL_BASED_OUTPATIENT_CLINIC_OR_DEPARTMENT_OTHER): Payer: Self-pay

## 2023-11-04 ENCOUNTER — Other Ambulatory Visit (HOSPITAL_BASED_OUTPATIENT_CLINIC_OR_DEPARTMENT_OTHER): Payer: Self-pay

## 2023-11-04 ENCOUNTER — Telehealth: Payer: Commercial Managed Care - PPO | Admitting: Physician Assistant

## 2023-11-04 DIAGNOSIS — R051 Acute cough: Secondary | ICD-10-CM

## 2023-11-04 MED ORDER — PREDNISONE 20 MG PO TABS
40.0000 mg | ORAL_TABLET | Freq: Every day | ORAL | 0 refills | Status: AC
Start: 2023-11-04 — End: 2023-11-10
  Filled 2023-11-04: qty 10, 5d supply, fill #0

## 2023-11-04 MED ORDER — PROMETHAZINE-DM 6.25-15 MG/5ML PO SYRP
5.0000 mL | ORAL_SOLUTION | Freq: Four times a day (QID) | ORAL | 0 refills | Status: DC | PRN
Start: 2023-11-04 — End: 2023-11-19
  Filled 2023-11-04: qty 118, 6d supply, fill #0

## 2023-11-04 NOTE — Progress Notes (Signed)
 E-Visit for Cough   We are sorry that you are not feeling well.  Here is how we plan to help!  Based on your presentation I believe you most likely have A cough due to a virus.  This is called viral bronchitis and is best treated by rest, plenty of fluids and control of the cough.  You may use Ibuprofen or Tylenol  as directed to help your symptoms.     In addition you may use Promethazine  syrup and Prednisone  as directed     From your responses in the eVisit questionnaire you describe inflammation in the upper respiratory tract which is causing a significant cough.  This is commonly called Bronchitis and has four common causes:   Allergies Viral Infections Acid Reflux Bacterial Infection Allergies, viruses and acid reflux are treated by controlling symptoms or eliminating the cause. An example might be a cough caused by taking certain blood pressure medications. You stop the cough by changing the medication. Another example might be a cough caused by acid reflux. Controlling the reflux helps control the cough.  USE OF BRONCHODILATOR ("RESCUE") INHALERS: There is a risk from using your bronchodilator too frequently.  The risk is that over-reliance on a medication which only relaxes the muscles surrounding the breathing tubes can reduce the effectiveness of medications prescribed to reduce swelling and congestion of the tubes themselves.  Although you feel brief relief from the bronchodilator inhaler, your asthma may actually be worsening with the tubes becoming more swollen and filled with mucus.  This can delay other crucial treatments, such as oral steroid medications. If you need to use a bronchodilator inhaler daily, several times per day, you should discuss this with your provider.  There are probably better treatments that could be used to keep your asthma under control.     HOME CARE Only take medications as instructed by your medical team. Complete the entire course of an  antibiotic. Drink plenty of fluids and get plenty of rest. Avoid close contacts especially the very young and the elderly Cover your mouth if you cough or cough into your sleeve. Always remember to wash your hands A steam or ultrasonic humidifier can help congestion.   GET HELP RIGHT AWAY IF: You develop worsening fever. You become short of breath You cough up blood. Your symptoms persist after you have completed your treatment plan MAKE SURE YOU  Understand these instructions. Will watch your condition. Will get help right away if you are not doing well or get worse.    Thank you for choosing an e-visit.  Your e-visit answers were reviewed by a board certified advanced clinical practitioner to complete your personal care plan. Depending upon the condition, your plan could have included both over the counter or prescription medications.  Please review your pharmacy choice. Make sure the pharmacy is open so you can pick up prescription now. If there is a problem, you may contact your provider through [employer] and have the prescription routed to another pharmacy.  Your safety is important to us . If you have drug allergies check your prescription carefully.   For the next 24 hours you can use MyChart to ask questions about today's visit, request a non-urgent call back, or ask for a work or school excuse. You will get an email in the next two days asking about your experience. I hope that your e-visit has been valuable and will speed your recovery.  I have spent 5 minutes in review of e-visit questionnaire, review and  updating patient chart, medical decision making and response to patient.   Etter Hermann Mayers, PA-C

## 2023-11-05 ENCOUNTER — Telehealth: Payer: Self-pay

## 2023-11-05 ENCOUNTER — Other Ambulatory Visit (HOSPITAL_COMMUNITY): Payer: Self-pay

## 2023-11-05 ENCOUNTER — Other Ambulatory Visit (HOSPITAL_BASED_OUTPATIENT_CLINIC_OR_DEPARTMENT_OTHER): Payer: Self-pay

## 2023-11-05 NOTE — Telephone Encounter (Signed)
*  Pulm  Pharmacy Patient Advocate Encounter   Received notification from CoverMyMeds that prior authorization for Qvar RediHaler 80MCG/ACT aerosol  is required/requested.   Insurance verification completed.   The patient is insured through [facility] .   Per test claim:  TRIAL OF ARNUITY ELLIPTA  WITHIN THE PAST 120 DAYS is preferred by the insurance.  If suggested medication is appropriate, Please send in a new RX and discontinue this one. If not, please advise as to why it's not appropriate so that we may request a Prior Authorization. Please note, some preferred medications may still require a PA.  If the suggested medications have not been trialed and there are no contraindications to their use, the PA will not be submitted, as it will not be approved.   CMM Key: [id number]

## 2023-11-06 ENCOUNTER — Other Ambulatory Visit (HOSPITAL_BASED_OUTPATIENT_CLINIC_OR_DEPARTMENT_OTHER): Payer: Self-pay

## 2023-11-07 ENCOUNTER — Other Ambulatory Visit (HOSPITAL_BASED_OUTPATIENT_CLINIC_OR_DEPARTMENT_OTHER): Payer: Self-pay

## 2023-11-07 NOTE — Telephone Encounter (Signed)
Insurance is not covering Qvar anymore.  Please order Arnuity inhaler    # 1, ref x 5    inhale 1 puff, then rinse mouth well, once daily.Marland Kitchen

## 2023-11-08 ENCOUNTER — Other Ambulatory Visit (HOSPITAL_BASED_OUTPATIENT_CLINIC_OR_DEPARTMENT_OTHER): Payer: Self-pay

## 2023-11-08 NOTE — Telephone Encounter (Signed)
Dr Maple Hudson, please advise on the strength of Arnuity- 50, 100 or 200?   Thanks!

## 2023-11-11 ENCOUNTER — Other Ambulatory Visit (HOSPITAL_BASED_OUTPATIENT_CLINIC_OR_DEPARTMENT_OTHER): Payer: Self-pay

## 2023-11-11 MED ORDER — ARNUITY ELLIPTA 100 MCG/ACT IN AEPB
1.0000 | INHALATION_SPRAY | Freq: Every day | RESPIRATORY_TRACT | 5 refills | Status: DC
  Filled 2023-11-11: qty 30, 30d supply, fill #0

## 2023-11-11 NOTE — Telephone Encounter (Signed)
 Pt aware of med change  Verbalized understanding  Rx for arnuity sent to preferred pharm  Nothing further needed

## 2023-11-11 NOTE — Telephone Encounter (Signed)
 Arnuity 100

## 2023-11-11 NOTE — Addendum Note (Signed)
 Addended by: Christen Butter on: 11/11/2023 02:59 PM   Modules accepted: Orders

## 2023-11-12 ENCOUNTER — Other Ambulatory Visit (HOSPITAL_BASED_OUTPATIENT_CLINIC_OR_DEPARTMENT_OTHER): Payer: Self-pay

## 2023-11-13 ENCOUNTER — Other Ambulatory Visit (HOSPITAL_BASED_OUTPATIENT_CLINIC_OR_DEPARTMENT_OTHER): Payer: Self-pay

## 2023-11-19 ENCOUNTER — Telehealth: Payer: Self-pay | Admitting: Physician Assistant

## 2023-11-19 DIAGNOSIS — R052 Subacute cough: Secondary | ICD-10-CM

## 2023-11-19 MED ORDER — BENZONATATE 100 MG PO CAPS
100.0000 mg | ORAL_CAPSULE | Freq: Three times a day (TID) | ORAL | 0 refills | Status: AC | PRN
Start: 2023-11-19 — End: ?
  Filled 2023-11-19: qty 30, 10d supply, fill #0

## 2023-11-19 MED ORDER — MONTELUKAST SODIUM 10 MG PO TABS
10.0000 mg | ORAL_TABLET | Freq: Every day | ORAL | 0 refills | Status: AC
Start: 2023-11-19 — End: ?
  Filled 2023-11-19: qty 30, 30d supply, fill #0

## 2023-11-19 NOTE — Patient Instructions (Signed)
 Laurie Panda, thank you for joining Piedad Climes, PA-C for today's virtual visit.  While this provider is not your primary care provider (PCP), if your PCP is located in our provider database this encounter information will be shared with them immediately following your visit.   A [facility] MyChart account gives you access to today's visit and all your visits, tests, and labs performed at [facility] " click here if you don't have a [facility] MyChart account or go to [email or web address]  Consent: (Patient) Laurie Panda provided verbal consent for this virtual visit at the beginning of the encounter.  Current Medications:  Current Outpatient Medications:    amLODipine (NORVASC) 10 MG tablet, TAKE 1 TABLET BY MOUTH EVERY DAY, Disp: 90 tablet, Rfl: 3   atorvastatin (LIPITOR) 20 MG tablet, TAKE 1 TABLET BY MOUTH EVERY DAY FOR CHOLESTEROL, Disp: 90 tablet, Rfl: 3   azelastine (ASTELIN) 0.1 % nasal spray, Place 1 spray into both nostrils 2 (two) times daily. Use in each nostril as directed, Disp: 30 mL, Rfl: 0   beclomethasone (QVAR) 80 MCG/ACT inhaler, Inhale 2 puffs into the lungs 2 (two) times daily., Disp: 10.6 g, Rfl: 6   busPIRone (BUSPAR) 10 MG tablet, Take 1 tablet (10 mg total) by mouth 3 (three) times daily as needed., Disp: 270 tablet, Rfl: 3   Ergocalciferol (VITAMIN D2 PO), Take 5,000 Units by mouth. , Disp: , Rfl:    escitalopram (LEXAPRO) 20 MG tablet, Take 1 tablet (20 mg total) by mouth daily., Disp: 90 tablet, Rfl: 3   Fluticasone Furoate (ARNUITY ELLIPTA) 100 MCG/ACT AEPB, Inhale 1 puff into the lungs daily., Disp: 30 each, Rfl: 5   gabapentin (NEURONTIN) 300 MG capsule, Take 1-3 capsules (300-900 mg total) by mouth 3 (three) times daily as needed., Disp: 90 capsule, Rfl: 3   levocetirizine (XYZAL) 5 MG tablet, Take 1 tablet (5 mg total) by mouth every evening., Disp: 90 tablet, Rfl: 3   losartan (COZAAR) 100 MG tablet, TAKE 1 TABLET BY MOUTH  EVERY DAY, Disp: 90 tablet, Rfl: 3   Multiple Vitamin (MULTIVITAMIN) tablet, Take 1 tablet by mouth daily. (Patient not taking: Reported on 04/01/2023), Disp: , Rfl:    omeprazole (PRILOSEC) 40 MG capsule, Take 1 capsule (40 mg total) by mouth daily., Disp: 30 capsule, Rfl: 1   promethazine-dextromethorphan (PROMETHAZINE-DM) 6.25-15 MG/5ML syrup, Take 5 mLs by mouth 4 (four) times daily as needed for cough., Disp: 118 mL, Rfl: 0   vitamin B-12 (CYANOCOBALAMIN) 100 MCG tablet, Take 100 mcg by mouth daily., Disp: , Rfl:    Medications ordered in this encounter:  No orders of the defined types were placed in this encounter.    *If you need refills on other medications prior to your next appointment, please contact your pharmacy*  Follow-Up: Call back or seek an in-person evaluation if the symptoms worsen or if the condition fails to improve as anticipated.  [facility] [phone]  Other Instructions Please keep hydrated and rest. Continue your regular allergy/asthma medications. Start the Singulair once daily. The Tessalon is to help with symptom on cough when significant.  Please call your Pulmonologist to schedule follow-up.    If you have been instructed to have an in-person evaluation today at a local Urgent Care facility, please use the link below. It will take you to a list of all of our available [facility], including address, phone number and hours of operation. Please do not delay  care.  [facility]  If you or a family member do not have a primary care provider, use the link below to schedule a visit and establish care. When you choose a [facility] primary care physician or advanced practice provider, you gain a long-term partner in health. Find a Primary Care Provider  Learn more about [facility]'s in-office and virtual care options: [facility] Get Care Now

## 2023-11-19 NOTE — Progress Notes (Signed)
 Virtual Visit Consent   Kyle Levy, you are scheduled for a virtual visit with a [facility] provider today. Just as with appointments in the office, your consent must be obtained to participate. Your consent will be active for this visit and any virtual visit you may have with one of our providers in the next 365 days. If you have a MyChart account, a copy of this consent can be sent to you electronically.  As this is a virtual visit, video technology does not allow for your provider to perform a traditional examination. This may limit your provider's ability to fully assess your condition. If your provider identifies any concerns that need to be evaluated in person or the need to arrange testing (such as labs, EKG, etc.), we will make arrangements to do so. Although advances in technology are sophisticated, we cannot ensure that it will always work on either your end or our end. If the connection with a video visit is poor, the visit may have to be switched to a telephone visit. With either a video or telephone visit, we are not always able to ensure that we have a secure connection.  By engaging in this virtual visit, you consent to the provision of healthcare and authorize for your insurance to be billed (if applicable) for the services provided during this visit. Depending on your insurance coverage, you may receive a charge related to this service.  I need to obtain your verbal consent now. Are you willing to proceed with your visit today? Kyle Levy has provided verbal consent on 47/25/2025 for a virtual visit (video or telephone). Piedad Climes, [place]  Date: 11/19/2023 6:46 PM   Virtual Visit via Video Note   I, Piedad Climes, connected with  Kyle Levy  ([id number], 12-04-1975) on 11/19/23 at  6:45 PM EST by a video-enabled telemedicine application and verified that I am speaking with the correct person using two identifiers.  Location: Patient: Virtual Visit  Location Patient: Home Provider: Virtual Visit Location Provider: Home Office   I discussed the limitations of evaluation and management by telemedicine and the availability of in person appointments. The patient expressed understanding and agreed to proceed.    History of Present Illness: Kyle Levy is a 48 y.o. who identifies as a male who was assigned male at birth, and is being seen today for continued dry but persistent cough that has been present over the past couple of weeks. Initially thought possibly viral or related to history of asthma, placed on course of prednisone and promethazine-dm. Notes some initial improvement but symptoms persisted. Denies fever, chills, malaise or productive cough. Denies sinus pain or facial pain. Thought maybe more so allergy related so started Xyzal with only some improvement.    HPI: HPI  Problems:  Patient Active Problem List   Diagnosis Date Noted   Benign neoplasm of ascending colon 04/25/2023   Benign neoplasm of descending colon 04/25/2023   B12 deficiency 01/21/2023   Asthmatic bronchitis 12/11/2022   Upper airway cough syndrome 12/11/2022   GERD (gastroesophageal reflux disease) 12/11/2022   Encounter for well adult exam with abnormal findings 07/15/2021   Chronic radicular pain of lower back 11/16/2020   Visceral obesity 11/16/2020   Metabolic syndrome 11/16/2020   Polyphagia 11/16/2020   Vitamin D deficiency 07/17/2020   Prediabetes 07/13/2020   Chronic bursitis of left shoulder 06/26/2019   Laceration of multiple sites of skin 11/06/2018   Seasonal and perennial allergic rhinitis 11/14/2017  Obstructive sleep apnea 03/07/2017   Psoriasis 09/14/2015   Class 3 severe obesity with serious comorbidity and body mass index (BMI) of 50.0 to 59.9 in adult [facility]) 09/14/2015   Hematochezia 05/01/2012   Essential hypertension 08/27/2011   Mixed hyperlipidemia 08/27/2011   Anxiety 08/27/2011    Allergies: No Known  Allergies Medications:  Current Outpatient Medications:    benzonatate (TESSALON) 100 MG capsule, Take 1 capsule (100 mg total) by mouth 3 (three) times daily as needed for cough., Disp: 30 capsule, Rfl: 0   montelukast (SINGULAIR) 10 MG tablet, Take 1 tablet (10 mg total) by mouth at bedtime., Disp: 30 tablet, Rfl: 0   amLODipine (NORVASC) 10 MG tablet, TAKE 1 TABLET BY MOUTH EVERY DAY, Disp: 90 tablet, Rfl: 3   atorvastatin (LIPITOR) 20 MG tablet, TAKE 1 TABLET BY MOUTH EVERY DAY FOR CHOLESTEROL, Disp: 90 tablet, Rfl: 3   azelastine (ASTELIN) 0.1 % nasal spray, Place 1 spray into both nostrils 2 (two) times daily. Use in each nostril as directed, Disp: 30 mL, Rfl: 0   busPIRone (BUSPAR) 10 MG tablet, Take 1 tablet (10 mg total) by mouth 3 (three) times daily as needed., Disp: 270 tablet, Rfl: 3   Ergocalciferol (VITAMIN D2 PO), Take 5,000 Units by mouth. , Disp: , Rfl:    escitalopram (LEXAPRO) 20 MG tablet, Take 1 tablet (20 mg total) by mouth daily., Disp: 90 tablet, Rfl: 3   Fluticasone Furoate (ARNUITY ELLIPTA) 100 MCG/ACT AEPB, Inhale 1 puff into the lungs daily., Disp: 30 each, Rfl: 5   gabapentin (NEURONTIN) 300 MG capsule, Take 1-3 capsules (300-900 mg total) by mouth 3 (three) times daily as needed., Disp: 90 capsule, Rfl: 3   levocetirizine (XYZAL) 5 MG tablet, Take 1 tablet (5 mg total) by mouth every evening., Disp: 90 tablet, Rfl: 3   losartan (COZAAR) 100 MG tablet, TAKE 1 TABLET BY MOUTH EVERY DAY, Disp: 90 tablet, Rfl: 3   Multiple Vitamin (MULTIVITAMIN) tablet, Take 1 tablet by mouth daily. (Patient not taking: Reported on 04/01/2023), Disp: , Rfl:    omeprazole (PRILOSEC) 40 MG capsule, Take 1 capsule (40 mg total) by mouth daily., Disp: 30 capsule, Rfl: 1   vitamin B-12 (CYANOCOBALAMIN) 100 MCG tablet, Take 100 mcg by mouth daily., Disp: , Rfl:   Observations/Objective: Patient is well-developed, well-nourished in no acute distress.  Resting comfortably at home.  Head is  normocephalic, atraumatic.  No labored breathing. Speech is clear and coherent with logical content.  Patient is alert and oriented at baseline.   Assessment and Plan: 1. Subacute cough (Primary) - benzonatate (TESSALON) 100 MG capsule; Take 1 capsule (100 mg total) by mouth 3 (three) times daily as needed for cough.  Dispense: 30 capsule; Refill: 0 - montelukast (SINGULAIR) 10 MG tablet; Take 1 tablet (10 mg total) by mouth at bedtime.  Dispense: 30 tablet; Refill: 0  Question continued bronchospasm and upper airway cough syndrome versus other etiology. Does not seem infections. Will have him start daily Singulair in addition to current regimen. Tessalon to help with symptoms. He needs to schedule in-person follow-up with his Pulmonologist.   Follow Up Instructions: I discussed the assessment and treatment plan with the patient. The patient was provided an opportunity to ask questions and all were answered. The patient agreed with the plan and demonstrated an understanding of the instructions.  A copy of instructions were sent to the patient via MyChart unless otherwise noted below.   The patient was advised to call back  or seek an in-person evaluation if the symptoms worsen or if the condition fails to improve as anticipated.    Piedad Climes, PA-C

## 2023-11-20 ENCOUNTER — Other Ambulatory Visit (HOSPITAL_BASED_OUTPATIENT_CLINIC_OR_DEPARTMENT_OTHER): Payer: Self-pay

## 2023-11-28 ENCOUNTER — Ambulatory Visit: Payer: Self-pay | Admitting: Internal Medicine

## 2023-11-28 ENCOUNTER — Other Ambulatory Visit (HOSPITAL_BASED_OUTPATIENT_CLINIC_OR_DEPARTMENT_OTHER): Payer: Self-pay

## 2023-11-28 ENCOUNTER — Encounter: Payer: Self-pay | Admitting: Internal Medicine

## 2023-11-28 VITALS — BP 122/80 | HR 67 | Temp 98.8°F | Ht 71.0 in | Wt >= 6400 oz

## 2023-11-28 DIAGNOSIS — J309 Allergic rhinitis, unspecified: Secondary | ICD-10-CM | POA: Diagnosis not present

## 2023-11-28 DIAGNOSIS — E66813 Obesity, class 3: Secondary | ICD-10-CM

## 2023-11-28 DIAGNOSIS — I10 Essential (primary) hypertension: Secondary | ICD-10-CM | POA: Diagnosis not present

## 2023-11-28 DIAGNOSIS — E538 Deficiency of other specified B group vitamins: Secondary | ICD-10-CM

## 2023-11-28 DIAGNOSIS — Z6841 Body Mass Index (BMI) 40.0 and over, adult: Secondary | ICD-10-CM

## 2023-11-28 DIAGNOSIS — Z125 Encounter for screening for malignant neoplasm of prostate: Secondary | ICD-10-CM | POA: Diagnosis not present

## 2023-11-28 DIAGNOSIS — R7303 Prediabetes: Secondary | ICD-10-CM

## 2023-11-28 DIAGNOSIS — E559 Vitamin D deficiency, unspecified: Secondary | ICD-10-CM

## 2023-11-28 DIAGNOSIS — J019 Acute sinusitis, unspecified: Secondary | ICD-10-CM

## 2023-11-28 MED ORDER — PREDNISONE 10 MG PO TABS
ORAL_TABLET | ORAL | 0 refills | Status: AC
  Filled 2023-11-28: qty 18, 9d supply, fill #0

## 2023-11-28 MED ORDER — DOXYCYCLINE HYCLATE 100 MG PO TABS
100.0000 mg | ORAL_TABLET | Freq: Two times a day (BID) | ORAL | 0 refills | Status: DC
  Filled 2023-11-28: qty 20, 10d supply, fill #0

## 2023-11-28 MED ORDER — HYDROCODONE BIT-HOMATROP MBR 5-1.5 MG/5ML PO SOLN
5.0000 mL | Freq: Four times a day (QID) | ORAL | 0 refills | Status: AC | PRN
  Filled 2023-11-28: qty 180, 9d supply, fill #0

## 2023-11-28 MED ORDER — PHENTERMINE HCL 37.5 MG PO CAPS
37.5000 mg | ORAL_CAPSULE | ORAL | 1 refills | Status: AC
Start: 2023-11-28 — End: ?
  Filled 2023-11-28: qty 90, 90d supply, fill #0
  Filled 2024-02-24: qty 90, 90d supply, fill #1
  Filled 2024-02-25: qty 90, 90d supply, fill #0

## 2023-11-28 NOTE — Assessment & Plan Note (Signed)
 Mild to mod, for prednisone taper, to f/u any worsening symptoms or concerns

## 2023-11-28 NOTE — Patient Instructions (Addendum)
 Please take all new medication as prescribed  - the antibiotic, cough medicine, and prednisone  Please take all new medication as prescribed - the phentermine which we will try to limit to one year refilling  Please continue all other medications as before, and refills have been done if requested.  Please have the pharmacy call with any other refills you may need.  Please keep your appointments with your specialists as you may have planned  Please make an Appointment to return in 3 months, or sooner if needed, also with Lab Appointment for testing done 3-5 days before at the FIRST FLOOR Lab (so this is for TWO appointments - please see the scheduling desk as you leave)

## 2023-11-28 NOTE — Assessment & Plan Note (Signed)
 Super morbid now getting life threatening, unable to afford GLP1 - now for phentermine 37.5 mg daily with goal of minimum 350 lbs

## 2023-11-28 NOTE — Assessment & Plan Note (Signed)
 BP Readings from Last 3 Encounters:  11/28/23 122/80  04/25/23 (!) 167/101  01/28/23 130/78   Stable, pt to continue medical treatment norvasc 10 every day, losartan 100 every day, toprol xl 50 qd

## 2023-11-28 NOTE — Assessment & Plan Note (Signed)
 Last vitamin D Lab Results  Component Value Date   VD25OH 44.71 07/17/2023   Stable, cont oral replacement

## 2023-11-28 NOTE — Assessment & Plan Note (Signed)
 Mild to mod, for antibx course doxycylcine 100 bid, cough me dprn,  to f/u any worsening symptoms or concerns

## 2023-11-28 NOTE — Progress Notes (Signed)
 Patient ID: Kyle Levy, male   DOB: January 20, 1976, 48 y.o.   MRN: [id number]        Chief Complaint: follow up sinusitis, allergies, morbid obesity       HPI:  Kyle Levy is a 48 y.o. male  Here with 2-3 days acute onset fever, facial pain, pressure, headache, general weakness and malaise, and greenish d/c, with mild ST and cough, but pt denies chest pain, wheezing, increased sob or doe, orthopnea, PND, increased LE swelling, palpitations, dizziness or syncope.  Does have several wks ongoing nasal allergy symptoms with clearish congestion, itch and sneezing, without fever, pain, ST, cough, swelling or wheezing.   Pt denies polydipsia, polyuria, or new focal neuro s/s.   In fact wt has contd to increase and out of control       Wt Readings from Last 3 Encounters:  11/28/23 (!) 403 lb (182.8 kg)  04/25/23 (!) 383 lb (173.7 kg)  04/01/23 (!) 377 lb (171 kg)   BP Readings from Last 3 Encounters:  11/28/23 122/80  04/25/23 (!) 167/101  01/28/23 130/78         Past Medical History:  Diagnosis Date   Anxiety 08/27/2011   Back pain    HTN (hypertension) 08/27/2011   Hyperlipidemia 08/27/2011   Hypertension    Morbid obesity (HCC) 09/14/2015   OSA on CPAP    Psoriasis 09/14/2015   Sleep apnea    Past Surgical History:  Procedure Laterality Date   COLONOSCOPY WITH PROPOFOL N/A 04/25/2023   Procedure: COLONOSCOPY WITH PROPOFOL;  Surgeon: Meryl Dare, MD;  Location: [facility];  Service: Gastroenterology;  Laterality: N/A;   KNEE ARTHROSCOPY     bilateral knee   POLYPECTOMY  04/25/2023   Procedure: POLYPECTOMY;  Surgeon: Meryl Dare, MD;  Location: [facility];  Service: Gastroenterology;;    reports that he has never smoked. He has never used smokeless tobacco. He reports current alcohol use. He reports current drug use. Frequency: 1.00 time per week. Drug: Marijuana. family history includes Alcoholism in his father; Diabetes in his father; Eating disorder in his father;  Heart disease in his father; Hyperlipidemia in his father; Hypertension in his father; Obesity in his father; Sleep apnea in his father and mother; Stroke in his father. No Known Allergies Current Outpatient Medications on File Prior to Visit  Medication Sig Dispense Refill   amLODipine (NORVASC) 10 MG tablet TAKE 1 TABLET BY MOUTH EVERY DAY 90 tablet 3   atorvastatin (LIPITOR) 20 MG tablet TAKE 1 TABLET BY MOUTH EVERY DAY FOR CHOLESTEROL 90 tablet 3   azelastine (ASTELIN) 0.1 % nasal spray Place 1 spray into both nostrils 2 (two) times daily. Use in each nostril as directed 30 mL 0   Ergocalciferol (VITAMIN D2 PO) Take 5,000 Units by mouth.      escitalopram (LEXAPRO) 20 MG tablet Take 1 tablet (20 mg total) by mouth daily. 90 tablet 3   Fluticasone Furoate (ARNUITY ELLIPTA) 100 MCG/ACT AEPB Inhale 1 puff into the lungs daily. 30 each 5   gabapentin (NEURONTIN) 300 MG capsule Take 1-3 capsules (300-900 mg total) by mouth 3 (three) times daily as needed. 90 capsule 3   losartan (COZAAR) 100 MG tablet TAKE 1 TABLET BY MOUTH EVERY DAY 90 tablet 3   metoprolol succinate (TOPROL-XL) 50 MG 24 hr tablet Take 50 mg by mouth daily.     montelukast (SINGULAIR) 10 MG tablet Take 1 tablet (10 mg total) by mouth at bedtime. 30  tablet 0   Multiple Vitamin (MULTIVITAMIN) tablet Take 1 tablet by mouth daily.     omeprazole (PRILOSEC) 40 MG capsule Take 1 capsule (40 mg total) by mouth daily. 30 capsule 1   vitamin B-12 (CYANOCOBALAMIN) 100 MCG tablet Take 100 mcg by mouth daily.     benzonatate (TESSALON) 100 MG capsule Take 1 capsule (100 mg total) by mouth 3 (three) times daily as needed for cough. (Patient not taking: Reported on 11/28/2023) 30 capsule 0   No current facility-administered medications on file prior to visit.        ROS:  All others reviewed and negative.  Objective        PE:  BP 122/80 (BP Location: Right Arm, Patient Position: Sitting, Cuff Size: Normal)   Pulse 67   Temp 98.8 F (37.1  C) (Oral)   Ht 5\' 11"  (1.803 m)   Wt (!) 403 lb (182.8 kg)   SpO2 98%   BMI 56.21 kg/m                 Constitutional: Pt appears mild ill, supermorbid obese               HENT: Head: NCAT.                Right Ear: External ear normal.                 Left Ear: External ear normal. Bilat tm's with mild erythema.  Max sinus areas mild tender.  Pharynx with mild erythema, no exudate               Eyes: . Pupils are equal, round, and reactive to light. Conjunctivae and EOM are normal               Nose: without d/c or deformity               Neck: Neck supple. Gross normal ROM               Cardiovascular: Normal rate and regular rhythm.                 Pulmonary/Chest: Effort normal and breath sounds without rales or wheezing.                Abd:  Soft, NT, ND, + BS, no organomegaly               Neurological: Pt is alert. At baseline orientation, motor grossly intact               Skin: Skin is warm. No rashes, no other new lesions, LE edema - none               Psychiatric: Pt behavior is normal without agitation   Micro: none  Cardiac tracings I have personally interpreted today:  none  Pertinent Radiological findings (summarize): none   Lab Results  Component Value Date   WBC 7.4 01/14/2023   HGB 15.3 01/14/2023   HCT 45.8 01/14/2023   PLT 352.0 01/14/2023   GLUCOSE 92 07/17/2023   CHOL 162 07/17/2023   TRIG 185.0 (H) 07/17/2023   HDL 37.10 (L) 07/17/2023   LDLDIRECT 93.0 01/16/2022   LDLCALC 88 07/17/2023   ALT 30 07/17/2023   AST 28 07/17/2023   NA 135 07/17/2023   K 3.6 07/17/2023   CL 100 07/17/2023   CREATININE 0.87 07/17/2023   BUN 14 07/17/2023   CO2 26 07/17/2023  TSH 1.58 01/14/2023   PSA 0.74 01/14/2023   HGBA1C 5.9 07/17/2023   MICROALBUR 2.5 (H) 01/14/2023   Assessment/Plan:  Kyle Levy is a 48 y.o. White or Caucasian [1] male with  has a past medical history of Anxiety (08/27/2011), Back pain, HTN (hypertension) (08/27/2011), Hyperlipidemia  (08/27/2011), Hypertension, Morbid obesity (HCC) (09/14/2015), OSA on CPAP, Psoriasis (09/14/2015), and Sleep apnea.  Acute sinus infection Mild to mod, for antibx course doxycylcine 100 bid, cough me dprn,  to f/u any worsening symptoms or concerns  Allergic rhinitis Mild to mod, for prednisone taper, to f/u any worsening symptoms or concerns  Class 3 severe obesity with serious comorbidity and body mass index (BMI) of 50.0 to 59.9 in adult [facility]) Super morbid now getting life threatening, unable to afford GLP1 - now for phentermine 37.5 mg daily with goal of minimum 350 lbs  Essential hypertension BP Readings from Last 3 Encounters:  11/28/23 122/80  04/25/23 (!) 167/101  01/28/23 130/78   Stable, pt to continue medical treatment norvasc 10 every day, losartan 100 every day, toprol xl 50 qd   Vitamin D deficiency Last vitamin D Lab Results  Component Value Date   VD25OH 44.71 07/17/2023   Stable, cont oral replacement  Followup: Return if symptoms worsen or fail to improve.  Oliver Barre, MD 11/28/2023 1:02 PM [facility]

## 2023-11-29 ENCOUNTER — Other Ambulatory Visit: Payer: Self-pay

## 2023-11-29 ENCOUNTER — Other Ambulatory Visit (HOSPITAL_BASED_OUTPATIENT_CLINIC_OR_DEPARTMENT_OTHER): Payer: Self-pay

## 2023-12-06 ENCOUNTER — Other Ambulatory Visit (HOSPITAL_BASED_OUTPATIENT_CLINIC_OR_DEPARTMENT_OTHER): Payer: Self-pay

## 2023-12-06 MED FILL — Amlodipine Besylate Tab 10 MG (Base Equivalent): ORAL | 90 days supply | Qty: 90 | Fill #0 | Status: AC

## 2023-12-06 MED FILL — Atorvastatin Calcium Tab 20 MG (Base Equivalent): ORAL | 90 days supply | Qty: 90 | Fill #0 | Status: AC

## 2023-12-06 MED FILL — Losartan Potassium Tab 100 MG: ORAL | 90 days supply | Qty: 90 | Fill #0 | Status: AC

## 2023-12-09 ENCOUNTER — Other Ambulatory Visit: Payer: Self-pay

## 2023-12-09 ENCOUNTER — Other Ambulatory Visit (HOSPITAL_COMMUNITY): Payer: Self-pay

## 2023-12-09 ENCOUNTER — Other Ambulatory Visit: Payer: Self-pay | Admitting: Internal Medicine

## 2023-12-09 ENCOUNTER — Other Ambulatory Visit (HOSPITAL_BASED_OUTPATIENT_CLINIC_OR_DEPARTMENT_OTHER): Payer: Self-pay

## 2023-12-09 MED ORDER — METOPROLOL SUCCINATE ER 50 MG PO TB24
50.0000 mg | ORAL_TABLET | Freq: Every day | ORAL | 3 refills | Status: DC
  Filled 2023-12-09: qty 90, 90d supply, fill #0
  Filled 2024-02-24 – 2024-02-25 (×2): qty 90, 90d supply, fill #1
  Filled 2024-05-26: qty 90, 90d supply, fill #2

## 2023-12-25 ENCOUNTER — Encounter: Payer: Self-pay | Admitting: Internal Medicine

## 2023-12-25 ENCOUNTER — Other Ambulatory Visit (HOSPITAL_BASED_OUTPATIENT_CLINIC_OR_DEPARTMENT_OTHER): Payer: Self-pay

## 2023-12-25 ENCOUNTER — Telehealth (INDEPENDENT_AMBULATORY_CARE_PROVIDER_SITE_OTHER): Admitting: Internal Medicine

## 2023-12-25 VITALS — Ht 71.0 in

## 2023-12-25 DIAGNOSIS — R21 Rash and other nonspecific skin eruption: Secondary | ICD-10-CM | POA: Insufficient documentation

## 2023-12-25 MED ORDER — TRIAMCINOLONE ACETONIDE 0.1 % EX CREA
1.0000 | TOPICAL_CREAM | Freq: Two times a day (BID) | CUTANEOUS | 1 refills | Status: AC
  Filled 2023-12-25: qty 30, 15d supply, fill #0

## 2023-12-25 NOTE — Progress Notes (Signed)
 Patient ID: Kyle Levy, male   DOB: 06-12-1976, 48 y.o.   MRN: [id number]  Virtual Visit via Video Note  I connected with Laurie Panda on 12/25/23 at  1:40 PM EDT by a video enabled telemedicine application and verified that I am speaking with the correct person using two identifiers.  Location of all participants today Patient: at home Provider: at [facility]   I discussed the limitations of evaluation and management by telemedicine and the availability of in person appointments. The patient expressed understanding and agreed to proceed.  History of Present Illness: Here with c/o left leg rash with mild itching, several red lesions slightly raised,  No fever, swelling, ulcer. Pt denies chest pain, increased sob or doe, wheezing, orthopnea, PND, increased LE swelling, palpitations, dizziness or syncope.   Pt denies polydipsia, polyuria, or new focal neuro s/s.   Past Medical History:  Diagnosis Date   Anxiety 08/27/2011   Back pain    HTN (hypertension) 08/27/2011   Hyperlipidemia 08/27/2011   Hypertension    Morbid obesity (HCC) 09/14/2015   OSA on CPAP    Psoriasis 09/14/2015   Sleep apnea    Past Surgical History:  Procedure Laterality Date   COLONOSCOPY WITH PROPOFOL N/A 04/25/2023   Procedure: COLONOSCOPY WITH PROPOFOL;  Surgeon: Meryl Dare, MD;  Location: [facility];  Service: Gastroenterology;  Laterality: N/A;   KNEE ARTHROSCOPY     bilateral knee   POLYPECTOMY  04/25/2023   Procedure: POLYPECTOMY;  Surgeon: Meryl Dare, MD;  Location: [facility];  Service: Gastroenterology;;    reports that he has never smoked. He has never used smokeless tobacco. He reports current alcohol use. He reports current drug use. Frequency: 1.00 time per week. Drug: Marijuana. family history includes Alcoholism in his father; Diabetes in his father; Eating disorder in his father; Heart disease in his father; Hyperlipidemia in his father; Hypertension in his father; Obesity in his  father; Sleep apnea in his father and mother; Stroke in his father. No Known Allergies Current Outpatient Medications on File Prior to Visit  Medication Sig Dispense Refill   amLODipine (NORVASC) 10 MG tablet TAKE 1 TABLET BY MOUTH EVERY DAY 90 tablet 3   atorvastatin (LIPITOR) 20 MG tablet TAKE 1 TABLET BY MOUTH EVERY DAY FOR CHOLESTEROL 90 tablet 3   azelastine (ASTELIN) 0.1 % nasal spray Place 1 spray into both nostrils 2 (two) times daily. Use in each nostril as directed 30 mL 0   benzonatate (TESSALON) 100 MG capsule Take 1 capsule (100 mg total) by mouth 3 (three) times daily as needed for cough. 30 capsule 0   doxycycline (VIBRA-TABS) 100 MG tablet Take 1 tablet (100 mg total) by mouth 2 (two) times daily. 20 tablet 0   Ergocalciferol (VITAMIN D2 PO) Take 5,000 Units by mouth.      escitalopram (LEXAPRO) 20 MG tablet Take 1 tablet (20 mg total) by mouth daily. 90 tablet 3   Fluticasone Furoate (ARNUITY ELLIPTA) 100 MCG/ACT AEPB Inhale 1 puff into the lungs daily. 30 each 5   gabapentin (NEURONTIN) 300 MG capsule Take 1-3 capsules (300-900 mg total) by mouth 3 (three) times daily as needed. 90 capsule 3   losartan (COZAAR) 100 MG tablet TAKE 1 TABLET BY MOUTH EVERY DAY 90 tablet 3   metoprolol succinate (TOPROL-XL) 50 MG 24 hr tablet Take 1 tablet (50 mg total) by mouth daily. 90 tablet 3   montelukast (SINGULAIR) 10 MG tablet Take 1 tablet (10 mg total)  by mouth at bedtime. 30 tablet 0   Multiple Vitamin (MULTIVITAMIN) tablet Take 1 tablet by mouth daily.     omeprazole (PRILOSEC) 40 MG capsule Take 1 capsule (40 mg total) by mouth daily. 30 capsule 1   phentermine 37.5 MG capsule Take 1 capsule (37.5 mg total) by mouth every morning. 90 capsule 1   vitamin B-12 (CYANOCOBALAMIN) 100 MCG tablet Take 100 mcg by mouth daily.     No current facility-administered medications on file prior to visit.    Observations/Objective: Alert, NAD, appropriate mood and affect, resps normal, cn 2-12  intact, moves all 4s, left leg with several erythem areas- pt also to send pictures on mychart Lab Results  Component Value Date   WBC 7.4 01/14/2023   HGB 15.3 01/14/2023   HCT 45.8 01/14/2023   PLT 352.0 01/14/2023   GLUCOSE 92 07/17/2023   CHOL 162 07/17/2023   TRIG 185.0 (H) 07/17/2023   HDL 37.10 (L) 07/17/2023   LDLDIRECT 93.0 01/16/2022   LDLCALC 88 07/17/2023   ALT 30 07/17/2023   AST 28 07/17/2023   NA 135 07/17/2023   K 3.6 07/17/2023   CL 100 07/17/2023   CREATININE 0.87 07/17/2023   BUN 14 07/17/2023   CO2 26 07/17/2023   TSH 1.58 01/14/2023   PSA 0.74 01/14/2023   HGBA1C 5.9 07/17/2023   MICROALBUR 2.5 (H) 01/14/2023   Assessment and Plan: See notes  Follow Up Instructions: See notes   I discussed the assessment and treatment plan with the patient. The patient was provided an opportunity to ask questions and all were answered. The patient agreed with the plan and demonstrated an understanding of the instructions.   The patient was advised to call back or seek an in-person evaluation if the symptoms worsen or if the condition fails to improve as anticipated.   Oliver Barre, MD

## 2023-12-25 NOTE — Patient Instructions (Signed)
Please take all new medication as prescribed - the cream  Please continue all other medications as before, and refills have been done if requested.  Please have the pharmacy call with any other refills you may need.  Please continue your efforts at being more active, low cholesterol diet, and weight control  Please keep your appointments with your specialists as you may have planned    

## 2023-12-25 NOTE — Assessment & Plan Note (Signed)
 Mild to mod, most likely contact dermatitis, for triam cr prn,  to f/u any worsening symptoms or concerns

## 2024-01-06 ENCOUNTER — Other Ambulatory Visit (HOSPITAL_BASED_OUTPATIENT_CLINIC_OR_DEPARTMENT_OTHER): Payer: Self-pay

## 2024-01-27 NOTE — Progress Notes (Unsigned)
 HPI male never smoker followed for OSA, complicated by morbid obesity Unattended Home Sleep Test 04/01/17-AHI 12.8/hour, desaturation to 80%, body weight 374 pounds  ----------------------------------------------------------------------  01/28/23- 48 year old male never smoker followed for OSA, complicated by morbid obesity, Allergic Rhinitis, Asthmatic Bronchitis, GERD,  CPAP auto 5-15/Ada[employer] AirSense 10       replaced 12/11/22- Weight today 383 lbs Download compliance- 100%, AHI 0.6/ hr [name], NP 12/11/22-CPAP f/u and cough. Ordered replacement for old CPAP. Asthmatic bronchitis, GERD ------Pt received new cpap machine states its working great Download reviewed. No concerns expressed. Life is better with CPAP. He is frustrated by his weight but still trying to lose. Bronchitic cough for which he was seen in March has now resolved. CXR 12/11/22-  FINDINGS: Cardiac size is within normal limits. There are no signs of pulmonary edema or focal pulmonary consolidation. There is no pleural effusion or pneumothorax. IMPRESSION: There are no signs of pulmonary edema or focal pulmonary consolidation.  01/28/24- 48 year old male never smoker followed for OSA, complicated by morbid obesity, Allergic Rhinitis, Asthmatic Bronchitis, GERD,  CPAP auto 5-15/Adapt AirSense 10       replaced 12/11/22- Weight today 384 lbs Download compliance- 100%, AHI 0.6/hr -----Doing well.  Sleep is good.  Using CPAP nightly.  Needs new mask Discussed the use of AI scribe software for clinical note transcription with the patient, who gave verbal consent to proceed.  History of Present Illness   Kyle Levy "Kyle Levy" is a 48 year old male with sleep apnea and asthma who presents for CPAP management and mask replacement.  He experiences less than one breakthrough apnea per hour, indicating effective CPAP management. He seeks a replacement mask. Download reviewed. Sleeping well.  He previously used Arnuity Ellipta  and  requires a refill. He switched from Flovent  to Arnuity due to market availability.  No changes in breathing.     Assessment and Plan:    Asthma- mild intermittent uncomplicated Asthma management ongoing. Insurance required switch to powder formulation. - Send refill for Arnuity Ellipta  100 mcg to pharmacy. - Make prescription refillable for one year.  Obstructive Sleep Apnea Obstructive sleep apnea well-managed with CPAP. CPAP download shows effective treatment. - Send order for replacement CPAP mask to home care company.     ROS-see HPI   + = positive Constitutional:    weight loss, night sweats, fevers, chills, fatigue, lassitude. HEENT:    headaches, difficulty swallowing, tooth/dental problems, sore throat,       sneezing, itching, ear ache, + nasal congestion, post nasal drip, snoring CV:    chest pain, orthopnea, PND, swelling in lower extremities, anasarca,                                                      dizziness, palpitations Resp:   shortness of breath with exertion or at rest.                productive cough,   non-productive cough, coughing up of blood.              change in color of mucus.  wheezing.   Skin:    rash or lesions. GI:  No-   heartburn, indigestion, abdominal pain, nausea, vomiting, diarrhea,                 change in bowel  habits, loss of appetite GU: dysuria, change in color of urine, no urgency or frequency.   flank pain. MS:   joint pain, stiffness, decreased range of motion, back pain. Neuro-     nothing unusual Psych:  change in mood or affect.  depression or anxiety.   memory loss.  OBJ- Physical Exam General- Alert, Oriented, Affect-appropriate, Distress- none acute + morbidly obese/big man Skin- rash-none, lesions- none, excoriation- none Lymphadenopathy- none Head- atraumatic            Eyes- Gross vision intact, PERRLA, conjunctivae and secretions clear            Ears- Hearing, canals-normal            Nose-+ mild nasal congestion,  no-Septal dev, mucus, polyps, erosion, perforation             Throat- Mallampati III-IV , mucosa clear , drainage- none, tonsils- atrophic Neck- flexible , trachea midline, no stridor , thyroid  nl, carotid no bruit Chest - symmetrical excursion , unlabored           Heart/CV- RRR , no murmur , no gallop  , no rub, nl s1 s2                           - JVD- none , edema- none, stasis changes- none, varices- none           Lung- clear to P&A, wheeze- none, cough- none , dullness-none, rub- none           Chest wall-  Abd-  Br/ Gen/ Rectal- Not done, not indicated Extrem- cyanosis- none, clubbing, none, atrophy- none, strength- nl Neuro- grossly intact to observation

## 2024-01-28 ENCOUNTER — Ambulatory Visit: Payer: Commercial Managed Care - PPO | Admitting: Internal Medicine

## 2024-01-28 ENCOUNTER — Other Ambulatory Visit (HOSPITAL_BASED_OUTPATIENT_CLINIC_OR_DEPARTMENT_OTHER): Payer: Self-pay

## 2024-01-28 ENCOUNTER — Encounter: Payer: Self-pay | Admitting: Internal Medicine

## 2024-01-28 VITALS — BP 144/80 | HR 80 | Temp 97.9°F | Ht 71.0 in | Wt 384.2 lb

## 2024-01-28 DIAGNOSIS — J4520 Mild intermittent asthma, uncomplicated: Secondary | ICD-10-CM

## 2024-01-28 DIAGNOSIS — Z9981 Dependence on supplemental oxygen: Secondary | ICD-10-CM

## 2024-01-28 DIAGNOSIS — G4733 Obstructive sleep apnea (adult) (pediatric): Secondary | ICD-10-CM | POA: Diagnosis not present

## 2024-01-28 MED ORDER — ARNUITY ELLIPTA 100 MCG/ACT IN AEPB
1.0000 | INHALATION_SPRAY | Freq: Every day | RESPIRATORY_TRACT | 12 refills | Status: DC
  Filled 2024-01-28: qty 30, 30d supply, fill #0
  Filled 2024-05-26: qty 30, 30d supply, fill #1
  Filled 2024-09-15 (×2): qty 30, 30d supply, fill #2

## 2024-01-28 NOTE — Patient Instructions (Addendum)
 Arnuity refill sent  Order- DME [employer]- please replace CPAP mask of choice. Continue auto 5-15,suppies, humidifier, AirView

## 2024-02-24 ENCOUNTER — Other Ambulatory Visit (HOSPITAL_COMMUNITY): Payer: Self-pay

## 2024-02-24 ENCOUNTER — Other Ambulatory Visit: Payer: Self-pay

## 2024-02-24 ENCOUNTER — Encounter: Payer: Self-pay | Admitting: Pharmacist

## 2024-02-24 MED FILL — Losartan Potassium Tab 100 MG: ORAL | 90 days supply | Qty: 90 | Fill #1 | Status: CN

## 2024-02-24 MED FILL — Atorvastatin Calcium Tab 20 MG (Base Equivalent): ORAL | 90 days supply | Qty: 90 | Fill #1 | Status: CN

## 2024-02-24 MED FILL — Amlodipine Besylate Tab 10 MG (Base Equivalent): ORAL | 90 days supply | Qty: 90 | Fill #1 | Status: CN

## 2024-02-25 ENCOUNTER — Other Ambulatory Visit: Payer: Self-pay

## 2024-02-25 ENCOUNTER — Other Ambulatory Visit (HOSPITAL_COMMUNITY): Payer: Self-pay

## 2024-02-25 MED FILL — Losartan Potassium Tab 100 MG: ORAL | 90 days supply | Qty: 90 | Fill #0 | Status: AC

## 2024-02-25 MED FILL — Amlodipine Besylate Tab 10 MG (Base Equivalent): ORAL | 90 days supply | Qty: 90 | Fill #0 | Status: AC

## 2024-02-25 MED FILL — Atorvastatin Calcium Tab 20 MG (Base Equivalent): ORAL | 90 days supply | Qty: 90 | Fill #0 | Status: AC

## 2024-02-28 ENCOUNTER — Other Ambulatory Visit (HOSPITAL_BASED_OUTPATIENT_CLINIC_OR_DEPARTMENT_OTHER): Payer: Self-pay

## 2024-02-28 ENCOUNTER — Ambulatory Visit: Admitting: Internal Medicine

## 2024-02-28 ENCOUNTER — Encounter: Payer: Self-pay | Admitting: Internal Medicine

## 2024-02-28 ENCOUNTER — Other Ambulatory Visit (HOSPITAL_COMMUNITY): Payer: Self-pay

## 2024-02-28 VITALS — BP 136/82 | HR 85 | Temp 98.0°F | Ht 71.0 in | Wt 373.0 lb

## 2024-02-28 DIAGNOSIS — E538 Deficiency of other specified B group vitamins: Secondary | ICD-10-CM | POA: Diagnosis not present

## 2024-02-28 DIAGNOSIS — Z0000 Encounter for general adult medical examination without abnormal findings: Secondary | ICD-10-CM | POA: Diagnosis not present

## 2024-02-28 DIAGNOSIS — E66813 Obesity, class 3: Secondary | ICD-10-CM

## 2024-02-28 DIAGNOSIS — Z6841 Body Mass Index (BMI) 40.0 and over, adult: Secondary | ICD-10-CM

## 2024-02-28 DIAGNOSIS — E559 Vitamin D deficiency, unspecified: Secondary | ICD-10-CM

## 2024-02-28 DIAGNOSIS — E782 Mixed hyperlipidemia: Secondary | ICD-10-CM | POA: Diagnosis not present

## 2024-02-28 DIAGNOSIS — I10 Essential (primary) hypertension: Secondary | ICD-10-CM | POA: Diagnosis not present

## 2024-02-28 DIAGNOSIS — R7303 Prediabetes: Secondary | ICD-10-CM

## 2024-02-28 DIAGNOSIS — Z0001 Encounter for general adult medical examination with abnormal findings: Secondary | ICD-10-CM

## 2024-02-28 MED ORDER — PHENTERMINE HCL 37.5 MG PO CAPS
37.5000 mg | ORAL_CAPSULE | ORAL | 1 refills | Status: DC
  Filled 2024-02-28 (×2): qty 90, 90d supply, fill #0
  Filled 2024-05-26: qty 90, 90d supply, fill #1

## 2024-02-28 NOTE — Patient Instructions (Signed)
 Please continue all other medications as before, and refills have been done for the phentermine   Please have the pharmacy call with any other refills you may need.  Please continue your efforts at being more active, low cholesterol diet, and weight control.  You are otherwise up to date with prevention measures today.  Please keep your appointments with your specialists as you may have planned  Please go to the LAB at the blood drawing area for the tests to be done - at the [facility] will be contacted by phone if any changes need to be made immediately.  Otherwise, you will receive a letter about your results with an explanation, but please check with MyChart first.  Please make an Appointment to return in 6 months, or sooner if needed

## 2024-02-28 NOTE — Assessment & Plan Note (Signed)
 BP Readings from Last 3 Encounters:  02/28/24 136/82  01/28/24 (!) 144/80  11/28/23 122/80   Stable, pt to continue medical treatment norvasc  10 every day, losartan  100 every day, toprol  xl 50 qd

## 2024-02-28 NOTE — Progress Notes (Signed)
 Patient ID: Kyle Levy, male   DOB: 22-Dec-1975, 48 y.o.   MRN: [id number]         Chief Complaint:: wellness exam and obesity, low b12 and D, htn, hld, preDM       HPI:  Kyle Levy is a 48 y.o. male here for wellness exam; declines prevnar and hep c screen, o/w up to date                        Also Lost wt 30 lbs so far with diet and phentermine .  Pt denies chest pain, increased sob or doe, wheezing, orthopnea, PND, increased LE swelling, palpitations, dizziness or syncope.   Pt denies polydipsia, polyuria, or new focal neuro s/s.    Pt denies fever, wt loss, night sweats, loss of appetite, or other constitutional symptoms     Wt Readings from Last 3 Encounters:  02/28/24 (!) 373 lb (169.2 kg)  01/28/24 (!) 384 lb 3.2 oz (174.3 kg)  11/28/23 (!) 403 lb (182.8 kg)   BP Readings from Last 3 Encounters:  02/28/24 136/82  01/28/24 (!) 144/80  11/28/23 122/80   Immunization History  Administered Date(s) Administered   Influenza,inj,Quad PF,6+ Mos 05/28/2018, 06/04/2019, 07/12/2020, 07/14/2021, 07/19/2022   PFIZER(Purple Top)SARS-COV-2 Vaccination 10/11/2019, 10/31/2019, 08/02/2020   Tdap 10/06/2013, 10/30/2018   Health Maintenance Due  Topic Date Due   Hepatitis C Screening  Never done      Past Medical History:  Diagnosis Date   Anxiety 08/27/2011   Back pain    HTN (hypertension) 08/27/2011   Hyperlipidemia 08/27/2011   Hypertension    Morbid obesity (HCC) 09/14/2015   OSA on CPAP    Psoriasis 09/14/2015   Sleep apnea    Past Surgical History:  Procedure Laterality Date   COLONOSCOPY WITH PROPOFOL  N/A 04/25/2023   Procedure: COLONOSCOPY WITH PROPOFOL ;  Surgeon: Asencion Blacksmith, MD;  Location: [facility];  Service: Gastroenterology;  Laterality: N/A;   KNEE ARTHROSCOPY     bilateral knee   POLYPECTOMY  04/25/2023   Procedure: POLYPECTOMY;  Surgeon: Asencion Blacksmith, MD;  Location: [facility];  Service: Gastroenterology;;    reports that he has never smoked.  He has never used smokeless tobacco. He reports current alcohol use. He reports current drug use. Frequency: 1.00 time per week. Drug: Marijuana. family history includes Alcoholism in his father; Diabetes in his father; Eating disorder in his father; Heart disease in his father; Hyperlipidemia in his father; Hypertension in his father; Obesity in his father; Sleep apnea in his father and mother; Stroke in his father. No Known Allergies Current Outpatient Medications on File Prior to Visit  Medication Sig Dispense Refill   amLODipine  (NORVASC ) 10 MG tablet TAKE 1 TABLET BY MOUTH EVERY DAY 90 tablet 3   atorvastatin  (LIPITOR) 20 MG tablet TAKE 1 TABLET BY MOUTH EVERY DAY FOR CHOLESTEROL 90 tablet 3   azelastine  (ASTELIN ) 0.1 % nasal spray Place 1 spray into both nostrils 2 (two) times daily. Use in each nostril as directed 30 mL 0   benzonatate  (TESSALON ) 100 MG capsule Take 1 capsule (100 mg total) by mouth 3 (three) times daily as needed for cough. 30 capsule 0   Ergocalciferol  (VITAMIN D2 PO) Take 5,000 Units by mouth.      escitalopram  (LEXAPRO ) 20 MG tablet Take 1 tablet (20 mg total) by mouth daily. 90 tablet 3   Fluticasone  Furoate (ARNUITY ELLIPTA ) 100 MCG/ACT AEPB Inhale 1 puff into the  lungs daily. 30 each 12   gabapentin  (NEURONTIN ) 300 MG capsule Take 1-3 capsules (300-900 mg total) by mouth 3 (three) times daily as needed. 90 capsule 3   losartan  (COZAAR ) 100 MG tablet TAKE 1 TABLET BY MOUTH EVERY DAY 90 tablet 3   metoprolol  succinate (TOPROL -XL) 50 MG 24 hr tablet Take 1 tablet (50 mg total) by mouth daily. 90 tablet 3   montelukast  (SINGULAIR ) 10 MG tablet Take 1 tablet (10 mg total) by mouth at bedtime. 30 tablet 0   Multiple Vitamin (MULTIVITAMIN) tablet Take 1 tablet by mouth daily.     omeprazole  (PRILOSEC) 40 MG capsule Take 1 capsule (40 mg total) by mouth daily. 30 capsule 1   triamcinolone  cream (KENALOG ) 0.1 % Apply to the affected area(s) topically 2 times daily. 30 g 1    vitamin B-12 (CYANOCOBALAMIN ) 100 MCG tablet Take 100 mcg by mouth daily.     No current facility-administered medications on file prior to visit.        ROS:  All others reviewed and negative.  Objective        PE:  BP 136/82 (BP Location: Right Arm, Patient Position: Sitting, Cuff Size: Normal)   Pulse 85   Temp 98 F (36.7 C) (Oral)   Ht 5\' 11"  (1.803 m)   Wt (!) 373 lb (169.2 kg)   SpO2 98%   BMI 52.02 kg/m                 Constitutional: Pt appears in NAD               HENT: Head: NCAT.                Right Ear: External ear normal.                 Left Ear: External ear normal.                Eyes: . Pupils are equal, round, and reactive to light. Conjunctivae and EOM are normal               Nose: without d/c or deformity               Neck: Neck supple. Gross normal ROM               Cardiovascular: Normal rate and regular rhythm.                 Pulmonary/Chest: Effort normal and breath sounds without rales or wheezing.                Abd:  Soft, NT, ND, + BS, no organomegaly               Neurological: Pt is alert. At baseline orientation, motor grossly intact               Skin: Skin is warm. No rashes, no other new lesions, LE edema - trace bilateral               Psychiatric: Pt behavior is normal without agitation   Micro: none  Cardiac tracings I have personally interpreted today:  none  Pertinent Radiological findings (summarize): none   Lab Results  Component Value Date   WBC 7.4 01/14/2023   HGB 15.3 01/14/2023   HCT 45.8 01/14/2023   PLT 352.0 01/14/2023   GLUCOSE 92 07/17/2023   CHOL 162 07/17/2023   TRIG 185.0 (H) 07/17/2023   HDL  37.10 (L) 07/17/2023   LDLDIRECT 93.0 01/16/2022   LDLCALC 88 07/17/2023   ALT 30 07/17/2023   AST 28 07/17/2023   NA 135 07/17/2023   K 3.6 07/17/2023   CL 100 07/17/2023   CREATININE 0.87 07/17/2023   BUN 14 07/17/2023   CO2 26 07/17/2023   TSH 1.58 01/14/2023   PSA 0.74 01/14/2023   HGBA1C 5.9 07/17/2023    MICROALBUR 2.5 (H) 01/14/2023   Assessment/Plan:  [name] is a 48 y.o. White or Caucasian [1] male with  has a past medical history of Anxiety (08/27/2011), Back pain, HTN (hypertension) (08/27/2011), Hyperlipidemia (08/27/2011), Hypertension, Morbid obesity (HCC) (09/14/2015), OSA on CPAP, Psoriasis (09/14/2015), and Sleep apnea.  Encounter for well adult exam with abnormal findings Age and sex appropriate education and counseling updated with regular exercise and diet Referrals for preventative services - none needed Immunizations addressed - decliens prevnar 20 Smoking counseling  - none needed Evidence for depression or other mood disorder - none significant Most recent labs reviewed. I have personally reviewed and have noted: 1) the patient's medical and social history 2) The patient's current medications and supplements 3) The patient's height, weight, and BMI have been recorded in the chart   B12 deficiency Lab Results  Component Value Date   VITAMINB12 285 01/14/2023   Low, to start oral replacement - b12 1000 mcg qd   Class 3 severe obesity with serious comorbidity and body mass index (BMI) of 50.0 to 59.9 in adult [facility]) Improving, for contd phentermine  asd  Essential hypertension BP Readings from Last 3 Encounters:  02/28/24 136/82  01/28/24 (!) 144/80  11/28/23 122/80   Stable, pt to continue medical treatment norvasc  10 every day, losartan  100 every day, toprol  xl 50 qd   Mixed hyperlipidemia Lab Results  Component Value Date   LDLCALC 88 07/17/2023   Stable, pt to continue current statin lipitor 20 mg qd   Prediabetes Lab Results  Component Value Date   HGBA1C 5.9 07/17/2023   Stable, pt to continue current medical treatment  - diet,wt control   Vitamin D  deficiency Last vitamin D  Lab Results  Component Value Date   VD25OH 44.71 07/17/2023   Stable, cont oral replacement  Followup: Return in about 6 months (around  08/29/2024).  Rosalia Colonel, MD 02/28/2024 9:46 PM [facility] - [facility] Internal Medicine

## 2024-02-28 NOTE — Assessment & Plan Note (Signed)
 Lab Results  Component Value Date   HGBA1C 5.9 07/17/2023   Stable, pt to continue current medical treatment  - diet,wt control

## 2024-02-28 NOTE — Assessment & Plan Note (Signed)
 Age and sex appropriate education and counseling updated with regular exercise and diet Referrals for preventative services - none needed Immunizations addressed - decliens prevnar 20 Smoking counseling  - none needed Evidence for depression or other mood disorder - none significant Most recent labs reviewed. I have personally reviewed and have noted: 1) the patient's medical and social history 2) The patient's current medications and supplements 3) The patient's height, weight, and BMI have been recorded in the chart

## 2024-02-28 NOTE — Assessment & Plan Note (Signed)
Lab Results  Component Value Date   VITAMINB12 285 01/14/2023   Low, to start oral replacement - b12 1000 mcg qd

## 2024-02-28 NOTE — Assessment & Plan Note (Signed)
 Improving, for contd phentermine  asd

## 2024-02-28 NOTE — Assessment & Plan Note (Signed)
 Lab Results  Component Value Date   LDLCALC 88 07/17/2023   Stable, pt to continue current statin lipitor 20 mg qd

## 2024-02-28 NOTE — Assessment & Plan Note (Signed)
 Last vitamin D Lab Results  Component Value Date   VD25OH 44.71 07/17/2023   Stable, cont oral replacement

## 2024-03-23 ENCOUNTER — Other Ambulatory Visit (HOSPITAL_COMMUNITY): Payer: Self-pay

## 2024-05-21 DIAGNOSIS — H52223 Regular astigmatism, bilateral: Secondary | ICD-10-CM | POA: Diagnosis not present

## 2024-05-21 DIAGNOSIS — H524 Presbyopia: Secondary | ICD-10-CM | POA: Diagnosis not present

## 2024-05-22 ENCOUNTER — Encounter: Payer: Self-pay | Admitting: Internal Medicine

## 2024-05-26 ENCOUNTER — Other Ambulatory Visit: Payer: Self-pay

## 2024-05-26 ENCOUNTER — Other Ambulatory Visit (HOSPITAL_BASED_OUTPATIENT_CLINIC_OR_DEPARTMENT_OTHER): Payer: Self-pay

## 2024-05-26 MED FILL — Atorvastatin Calcium Tab 20 MG (Base Equivalent): ORAL | 90 days supply | Qty: 90 | Fill #1 | Status: AC

## 2024-05-26 MED FILL — Amlodipine Besylate Tab 10 MG (Base Equivalent): ORAL | 90 days supply | Qty: 90 | Fill #1 | Status: AC

## 2024-05-26 MED FILL — Losartan Potassium Tab 100 MG: ORAL | 90 days supply | Qty: 90 | Fill #1 | Status: AC

## 2024-05-27 NOTE — Telephone Encounter (Signed)
 Actually the form does not require specific testing, it only asks for a signature that the patient does not have communicable disease.  I will fill out the form now

## 2024-08-24 ENCOUNTER — Encounter: Payer: Self-pay | Admitting: Internal Medicine

## 2024-08-31 ENCOUNTER — Ambulatory Visit: Admitting: Internal Medicine

## 2024-08-31 ENCOUNTER — Encounter: Payer: Self-pay | Admitting: Internal Medicine

## 2024-09-01 ENCOUNTER — Other Ambulatory Visit (HOSPITAL_BASED_OUTPATIENT_CLINIC_OR_DEPARTMENT_OTHER): Payer: Self-pay

## 2024-09-01 ENCOUNTER — Other Ambulatory Visit: Payer: Self-pay

## 2024-09-01 MED ORDER — PHENTERMINE HCL 37.5 MG PO CAPS: 37.5000 mg | ORAL_CAPSULE | ORAL | 1 refills | Status: DC

## 2024-09-01 MED ORDER — LOSARTAN POTASSIUM 100 MG PO TABS: 100.0000 mg | ORAL_TABLET | Freq: Every day | ORAL | 3 refills | Status: AC

## 2024-09-01 MED ORDER — AMLODIPINE BESYLATE 10 MG PO TABS: 10.0000 mg | ORAL_TABLET | Freq: Every day | ORAL | 3 refills | Status: AC

## 2024-09-01 MED ORDER — ATORVASTATIN CALCIUM 20 MG PO TABS: 20.0000 mg | ORAL_TABLET | Freq: Every day | ORAL | 3 refills | Status: AC

## 2024-09-01 MED ORDER — METOPROLOL SUCCINATE ER 50 MG PO TB24: 50.0000 mg | ORAL_TABLET | Freq: Every day | ORAL | 3 refills | Status: AC

## 2024-09-01 MED ORDER — PHENTERMINE HCL 37.5 MG PO CAPS
37.5000 mg | ORAL_CAPSULE | ORAL | 1 refills | Status: AC
  Filled 2024-09-01: qty 90, 90d supply, fill #0

## 2024-09-01 NOTE — Telephone Encounter (Signed)
 Ok I have refilled the phentermine   thanks

## 2024-09-01 NOTE — Telephone Encounter (Signed)
 Refills have been sent to [facility].

## 2024-09-04 ENCOUNTER — Ambulatory Visit: Payer: Self-pay | Admitting: Internal Medicine

## 2024-09-04 ENCOUNTER — Other Ambulatory Visit

## 2024-09-04 VITALS — BP 130/82 | HR 66 | Temp 98.5°F | Ht 71.0 in | Wt 373.0 lb

## 2024-09-04 DIAGNOSIS — R7303 Prediabetes: Secondary | ICD-10-CM | POA: Diagnosis not present

## 2024-09-04 DIAGNOSIS — E782 Mixed hyperlipidemia: Secondary | ICD-10-CM | POA: Diagnosis not present

## 2024-09-04 DIAGNOSIS — E538 Deficiency of other specified B group vitamins: Secondary | ICD-10-CM | POA: Diagnosis not present

## 2024-09-04 DIAGNOSIS — E559 Vitamin D deficiency, unspecified: Secondary | ICD-10-CM | POA: Diagnosis not present

## 2024-09-04 DIAGNOSIS — Z125 Encounter for screening for malignant neoplasm of prostate: Secondary | ICD-10-CM

## 2024-09-04 DIAGNOSIS — I10 Essential (primary) hypertension: Secondary | ICD-10-CM | POA: Diagnosis not present

## 2024-09-04 LAB — BASIC METABOLIC PANEL WITH GFR
BUN: 12 mg/dL (ref 6–23)
CO2: 28 meq/L (ref 19–32)
Calcium: 9.1 mg/dL (ref 8.4–10.5)
Chloride: 101 meq/L (ref 96–112)
Creatinine, Ser: 0.87 mg/dL (ref 0.40–1.50)
GFR: 102.94 mL/min (ref >60.00)
Glucose, Bld: 93 mg/dL (ref 70–99)
Potassium: 3.9 meq/L (ref 3.5–5.1)
Sodium: 138 meq/L (ref 135–145)

## 2024-09-04 LAB — HEPATIC FUNCTION PANEL
ALT: 43 U/L (ref 0–53)
AST: 28 U/L (ref 0–37)
Albumin: 4.5 g/dL (ref 3.5–5.2)
Alkaline Phosphatase: 95 U/L (ref 39–117)
Bilirubin, Direct: 0.1 mg/dL (ref 0.0–0.3)
Total Bilirubin: 0.7 mg/dL (ref 0.2–1.2)
Total Protein: 7.5 g/dL (ref 6.0–8.3)

## 2024-09-04 LAB — VITAMIN B12: Vitamin B-12: 363 pg/mL (ref 211–911)

## 2024-09-04 LAB — URINALYSIS, ROUTINE W REFLEX MICROSCOPIC
Bilirubin Urine: NEGATIVE
Hgb urine dipstick: NEGATIVE
Ketones, ur: NEGATIVE
Leukocytes,Ua: NEGATIVE
Nitrite: NEGATIVE
Specific Gravity, Urine: 1.015 (ref 1.000–1.030)
Total Protein, Urine: NEGATIVE
Urine Glucose: NEGATIVE
Urobilinogen, UA: 0.2 (ref 0.0–1.0)
pH: 7.5 (ref 5.0–8.0)

## 2024-09-04 LAB — CBC WITH DIFFERENTIAL/PLATELET
Basophils Absolute: 0.1 K/uL (ref 0.0–0.1)
Basophils Relative: 0.8 % (ref 0.0–3.0)
Eosinophils Absolute: 0.2 K/uL (ref 0.0–0.7)
Eosinophils Relative: 2.2 % (ref 0.0–5.0)
HCT: 43.6 % (ref 39.0–52.0)
Hemoglobin: 14.9 g/dL (ref 13.0–17.0)
Lymphocytes Relative: 21.5 % (ref 12.0–46.0)
Lymphs Abs: 1.5 K/uL (ref 0.7–4.0)
MCHC: 34.1 g/dL (ref 30.0–36.0)
MCV: 83.9 fl (ref 78.0–100.0)
Monocytes Absolute: 0.5 K/uL (ref 0.1–1.0)
Monocytes Relative: 7.3 % (ref 3.0–12.0)
Neutro Abs: 4.8 K/uL (ref 1.4–7.7)
Neutrophils Relative %: 68.2 % (ref 43.0–77.0)
Platelets: 301 K/uL (ref 150.0–400.0)
RBC: 5.2 Mil/uL (ref 4.22–5.81)
RDW: 13.8 % (ref 11.5–15.5)
WBC: 7 K/uL (ref 4.0–10.5)

## 2024-09-04 LAB — TSH: TSH: 1.35 u[IU]/mL (ref 0.35–5.50)

## 2024-09-04 LAB — MICROALBUMIN / CREATININE URINE RATIO
Creatinine,U: 100.7 mg/dL
Microalb Creat Ratio: UNDETERMINED mg/g (ref 0.0–30.0)
Microalb, Ur: <0.7 mg/dL

## 2024-09-04 LAB — LIPID PANEL
Cholesterol: 156 mg/dL (ref 0–200)
HDL: 34.8 mg/dL — ABNORMAL LOW (ref >39.00)
LDL Cholesterol: 91 mg/dL (ref 0–99)
NonHDL: 121.56
Total CHOL/HDL Ratio: 4
Triglycerides: 151 mg/dL — ABNORMAL HIGH (ref 0.0–149.0)
VLDL: 30.2 mg/dL (ref 0.0–40.0)

## 2024-09-04 LAB — VITAMIN D 25 HYDROXY (VIT D DEFICIENCY, FRACTURES): VITD: 43.09 ng/mL (ref 30.00–100.00)

## 2024-09-04 LAB — HEMOGLOBIN A1C: Hgb A1c MFr Bld: 5.9 % (ref 4.6–6.5)

## 2024-09-04 LAB — PSA: PSA: 0.65 ng/mL (ref 0.10–4.00)

## 2024-09-04 NOTE — Patient Instructions (Signed)
 You had the flu shot today  Please continue all other medications as before, and refills have been done if requested.  Please have the pharmacy call with any other refills you may need.  Please continue your efforts at being more active, low cholesterol diet, and weight control  Please keep your appointments with your specialists as you may have planned - sleep apnea  We will review the labs when back later today  Please make an Appointment to return in 6 months, or sooner if needed

## 2024-09-04 NOTE — Progress Notes (Signed)
 Patient ID: Kyle Levy, male   DOB: 16-Apr-1976, 48 y.o.   MRN: [id number]        Chief Complaint: follow up HTN, HLD, prediabetes, low vit d and b12       HPI:  Kyle Levy is a 48 y.o. male here overall doing ok, Pt denies chest pain, increased sob or doe, wheezing, orthopnea, PND, increased LE swelling, palpitations, dizziness or syncope.   Pt denies polydipsia, polyuria, or new focal neuro s/s.    Pt denies fever, wt loss, night sweats, loss of appetite, or other constitutional symptoms   Using CPAP nightly/  Started  back at the gym recently.  For flu shot today Wt Readings from Last 3 Encounters:  09/04/24 (!) 373 lb (169.2 kg)  02/28/24 (!) 373 lb (169.2 kg)  01/28/24 (!) 384 lb 3.2 oz (174.3 kg)   BP Readings from Last 3 Encounters:  09/04/24 130/82  02/28/24 136/82  01/28/24 (!) 144/80         Past Medical History:  Diagnosis Date   Anxiety 08/27/2011   Back pain    HTN (hypertension) 08/27/2011   Hyperlipidemia 08/27/2011   Hypertension    Morbid obesity (HCC) 09/14/2015   OSA on CPAP    Psoriasis 09/14/2015   Sleep apnea    Past Surgical History:  Procedure Laterality Date   COLONOSCOPY WITH PROPOFOL  N/A 04/25/2023   Procedure: COLONOSCOPY WITH PROPOFOL ;  Surgeon: Aneita Gwendlyn DASEN, MD;  Location: [facility];  Service: Gastroenterology;  Laterality: N/A;   KNEE ARTHROSCOPY     bilateral knee   POLYPECTOMY  04/25/2023   Procedure: POLYPECTOMY;  Surgeon: Aneita Gwendlyn DASEN, MD;  Location: [facility];  Service: Gastroenterology;;    reports that he has never smoked. He has never used smokeless tobacco. He reports current alcohol use. He reports current drug use. Frequency: 1.00 time per week. Drug: Marijuana. family history includes Alcoholism in his father; Diabetes in his father; Eating disorder in his father; Heart disease in his father; Hyperlipidemia in his father; Hypertension in his father; Obesity in his father; Sleep apnea in his father and mother; Stroke  in his father. Allergies[1] Medications Ordered Prior to Encounter[2]      ROS:  All others reviewed and negative.  Objective        PE:  BP 130/82 (BP Location: Right Arm, Patient Position: Sitting, Cuff Size: Normal)   Pulse 66   Temp 98.5 F (36.9 C) (Oral)   Ht 5' 11 (1.803 m)   Wt (!) 373 lb (169.2 kg)   SpO2 96%   BMI 52.02 kg/m                 Constitutional: Pt appears in NAD               HENT: Head: NCAT.                Right Ear: External ear normal.                 Left Ear: External ear normal.                Eyes: . Pupils are equal, round, and reactive to light. Conjunctivae and EOM are normal               Nose: without d/c or deformity               Neck: Neck supple. Gross normal ROM  Cardiovascular: Normal rate and regular rhythm.                 Pulmonary/Chest: Effort normal and breath sounds without rales or wheezing.                Abd:  Soft, NT, ND, + BS, no organomegaly               Neurological: Pt is alert. At baseline orientation, motor grossly intact               Skin: Skin is warm. No rashes, no other new lesions, LE edema - none               Psychiatric: Pt behavior is normal without agitation   Micro: none  Cardiac tracings I have personally interpreted today:  none  Pertinent Radiological findings (summarize): none   Lab Results  Component Value Date   WBC 7.0 09/04/2024   HGB 14.9 09/04/2024   HCT 43.6 09/04/2024   PLT 301.0 09/04/2024   GLUCOSE 93 09/04/2024   CHOL 156 09/04/2024   TRIG 151.0 (H) 09/04/2024   HDL 34.80 (L) 09/04/2024   LDLDIRECT 93.0 01/16/2022   LDLCALC 91 09/04/2024   ALT 43 09/04/2024   AST 28 09/04/2024   NA 138 09/04/2024   K 3.9 09/04/2024   CL 101 09/04/2024   CREATININE 0.87 09/04/2024   BUN 12 09/04/2024   CO2 28 09/04/2024   TSH 1.35 09/04/2024   PSA 0.65 09/04/2024   HGBA1C 5.9 09/04/2024   MICROALBUR <0.7 09/04/2024   Assessment/Plan:  Kyle Levy is a 48 y.o. White or  Caucasian [1] male with  has a past medical history of Anxiety (08/27/2011), Back pain, HTN (hypertension) (08/27/2011), Hyperlipidemia (08/27/2011), Hypertension, Morbid obesity (HCC) (09/14/2015), OSA on CPAP, Psoriasis (09/14/2015), and Sleep apnea.  Vitamin D  deficiency Last vitamin D  Lab Results  Component Value Date   VD25OH 43.09 09/04/2024   Stable, cont oral replacement   Prediabetes Lab Results  Component Value Date   HGBA1C 5.9 09/04/2024   Stable, pt to continue current medical treatment  - diet, wt control   Mixed hyperlipidemia Lab Results  Component Value Date   LDLCALC 91 09/04/2024   Stable, pt to continue current statin lipitor 20 mg qd   Essential hypertension BP Readings from Last 3 Encounters:  09/04/24 130/82  02/28/24 136/82  01/28/24 (!) 144/80   Stable, pt to continue medical treatment norvasc  10 every day, losartan  100 every day, toprol  xl 50 qd   B12 deficiency Lab Results  Component Value Date   VITAMINB12 363 09/04/2024   Stable, cont oral replacement - b12 1000 mcg qd  Followup: Return in about 6 months (around 03/05/2025).  Lynwood Rush, MD 09/06/2024 4:06 PM  [facility] [place] [facility] - [facility] Internal Medicine     [1] No Known Allergies [2]  Current Outpatient Medications on File Prior to Visit  Medication Sig Dispense Refill   amLODipine  (NORVASC ) 10 MG tablet TAKE 1 TABLET BY MOUTH EVERY DAY 90 tablet 3   atorvastatin  (LIPITOR) 20 MG tablet TAKE 1 TABLET BY MOUTH EVERY DAY FOR CHOLESTEROL 90 tablet 3   azelastine  (ASTELIN ) 0.1 % nasal spray Place 1 spray into both nostrils 2 (two) times daily. Use in each nostril as directed 30 mL 0   benzonatate  (TESSALON ) 100 MG capsule Take 1 capsule (100 mg total) by mouth 3 (three) times daily as needed for cough. 30 capsule  0   Ergocalciferol  (VITAMIN D2 PO) Take 5,000 Units by mouth.      escitalopram  (LEXAPRO ) 20 MG tablet Take 1 tablet (20 mg total) by  mouth daily. 90 tablet 3   Fluticasone  Furoate (ARNUITY ELLIPTA ) 100 MCG/ACT AEPB Inhale 1 puff into the lungs daily. 30 each 12   gabapentin  (NEURONTIN ) 300 MG capsule Take 1-3 capsules (300-900 mg total) by mouth 3 (three) times daily as needed. 90 capsule 3   losartan  (COZAAR ) 100 MG tablet TAKE 1 TABLET BY MOUTH EVERY DAY 90 tablet 3   metoprolol  succinate (TOPROL -XL) 50 MG 24 hr tablet Take 1 tablet (50 mg total) by mouth daily. 90 tablet 3   montelukast  (SINGULAIR ) 10 MG tablet Take 1 tablet (10 mg total) by mouth at bedtime. 30 tablet 0   Multiple Vitamin (MULTIVITAMIN) tablet Take 1 tablet by mouth daily.     omeprazole  (PRILOSEC) 40 MG capsule Take 1 capsule (40 mg total) by mouth daily. 30 capsule 1   phentermine  37.5 MG capsule Take 1 capsule (37.5 mg total) by mouth every morning. 90 capsule 1   triamcinolone  cream (KENALOG ) 0.1 % Apply to the affected area(s) topically 2 times daily. 30 g 1   vitamin B-12 (CYANOCOBALAMIN ) 100 MCG tablet Take 100 mcg by mouth daily.     No current facility-administered medications on file prior to visit.

## 2024-09-04 NOTE — Progress Notes (Signed)
 The test results show that your current treatment is OK, as the tests are stable.  Please continue the same plan.  There is no other need for change of treatment or further evaluation based on these results, at this time.  thanks

## 2024-09-06 ENCOUNTER — Encounter: Payer: Self-pay | Admitting: Internal Medicine

## 2024-09-06 NOTE — Assessment & Plan Note (Signed)
 Lab Results  Component Value Date   HGBA1C 5.9 09/04/2024   Stable, pt to continue current medical treatment  - diet, wt control

## 2024-09-06 NOTE — Assessment & Plan Note (Signed)
 BP Readings from Last 3 Encounters:  09/04/24 130/82  02/28/24 136/82  01/28/24 (!) 144/80   Stable, pt to continue medical treatment norvasc  10 every day, losartan  100 every day, toprol  xl 50 qd

## 2024-09-06 NOTE — Assessment & Plan Note (Signed)
 Last vitamin D  Lab Results  Component Value Date   VD25OH 43.09 09/04/2024   Stable, cont oral replacement

## 2024-09-06 NOTE — Assessment & Plan Note (Signed)
 Lab Results  Component Value Date   LDLCALC 91 09/04/2024   Stable, pt to continue current statin lipitor 20 mg qd

## 2024-09-06 NOTE — Assessment & Plan Note (Signed)
 Lab Results  Component Value Date   VITAMINB12 363 09/04/2024   Stable, cont oral replacement - b12 1000 mcg qd

## 2024-09-07 ENCOUNTER — Other Ambulatory Visit (HOSPITAL_BASED_OUTPATIENT_CLINIC_OR_DEPARTMENT_OTHER): Payer: Self-pay

## 2024-09-15 ENCOUNTER — Other Ambulatory Visit (HOSPITAL_BASED_OUTPATIENT_CLINIC_OR_DEPARTMENT_OTHER): Payer: Self-pay

## 2024-09-16 ENCOUNTER — Other Ambulatory Visit: Payer: Self-pay | Admitting: Internal Medicine

## 2024-09-16 ENCOUNTER — Other Ambulatory Visit (HOSPITAL_BASED_OUTPATIENT_CLINIC_OR_DEPARTMENT_OTHER): Payer: Self-pay

## 2024-09-16 MED ORDER — ARNUITY ELLIPTA 100 MCG/ACT IN AEPB
1.0000 | INHALATION_SPRAY | Freq: Every day | RESPIRATORY_TRACT | 4 refills | Status: AC
  Filled 2024-09-16 – 2024-09-22 (×2): qty 30, 30d supply, fill #0

## 2024-09-16 NOTE — Telephone Encounter (Signed)
 Refill request for Arnuity.  Per chart note from 01/28/24 Dr. Neysa: Send refill for Arnuity Ellipta  100 mcg to pharmacy. - Make prescription refillable for one year.  Will okay refill x 5

## 2024-09-18 ENCOUNTER — Other Ambulatory Visit (HOSPITAL_BASED_OUTPATIENT_CLINIC_OR_DEPARTMENT_OTHER): Payer: Self-pay

## 2024-09-22 ENCOUNTER — Encounter: Payer: Self-pay | Admitting: Internal Medicine

## 2024-09-22 ENCOUNTER — Other Ambulatory Visit (HOSPITAL_BASED_OUTPATIENT_CLINIC_OR_DEPARTMENT_OTHER): Payer: Self-pay

## 2024-09-22 NOTE — Telephone Encounter (Signed)
 Pt states that the Arnuity is not covered  Can you please check for covered alternatives? Thanks!

## 2024-09-23 ENCOUNTER — Telehealth: Payer: Self-pay

## 2024-09-23 ENCOUNTER — Other Ambulatory Visit (HOSPITAL_BASED_OUTPATIENT_CLINIC_OR_DEPARTMENT_OTHER): Payer: Self-pay

## 2024-09-23 ENCOUNTER — Other Ambulatory Visit (HOSPITAL_COMMUNITY): Payer: Self-pay

## 2024-09-23 NOTE — Progress Notes (Unsigned)
 Follow up from pharmacy shows that the Generic Arnuity Ellipta  is covered for $0.00, pharmacy is proceeding with fill at this time.

## 2024-09-23 NOTE — Telephone Encounter (Signed)
 Alternatives covered are as follows:   Asmanex HFA- $20.00 Pulmicort Flexhaler- $20.00  *this may change depending on if patients insurance resets for the new year

## 2024-10-30 ENCOUNTER — Telehealth: Payer: Self-pay

## 2024-10-30 NOTE — Telephone Encounter (Signed)
 Copied from CRM [id number]. Topic: Appointments - Transfer of Care >> Oct 30, 2024  9:43 AM Rozanna MATSU wrote: Pt is requesting to transfer FROM: [facility] Pt is requesting to transfer TO: [facility] Reason for requested transfer: Provider retired It is the responsibility of the team the patient would like to transfer to (Dr. Theodoro) to reach out to the patient if for any reason this transfer is not acceptable.   Patient has been scheduled

## 2025-02-05 ENCOUNTER — Encounter

## 2025-02-26 ENCOUNTER — Encounter: Admitting: Internal Medicine
